# Patient Record
Sex: Female | Born: 1940
Health system: Southern US, Community
[De-identification: ages and names within clinical notes are randomized; demographics above are authoritative.]

## PROBLEM LIST (undated history)

## (undated) DIAGNOSIS — M1990 Unspecified osteoarthritis, unspecified site: Secondary | ICD-10-CM

## (undated) DIAGNOSIS — I10 Essential (primary) hypertension: Secondary | ICD-10-CM

## (undated) DIAGNOSIS — E785 Hyperlipidemia, unspecified: Secondary | ICD-10-CM

## (undated) DIAGNOSIS — T7840XA Allergy, unspecified, initial encounter: Secondary | ICD-10-CM

## (undated) DIAGNOSIS — E079 Disorder of thyroid, unspecified: Secondary | ICD-10-CM

## (undated) DIAGNOSIS — K5730 Diverticulosis of large intestine without perforation or abscess without bleeding: Secondary | ICD-10-CM

## (undated) DIAGNOSIS — R0600 Dyspnea, unspecified: Secondary | ICD-10-CM

## (undated) DIAGNOSIS — K635 Polyp of colon: Secondary | ICD-10-CM

## (undated) DIAGNOSIS — S72309A Unspecified fracture of shaft of unspecified femur, initial encounter for closed fracture: Secondary | ICD-10-CM

## (undated) DIAGNOSIS — M810 Age-related osteoporosis without current pathological fracture: Secondary | ICD-10-CM

## (undated) DIAGNOSIS — C801 Malignant (primary) neoplasm, unspecified: Secondary | ICD-10-CM

## (undated) DIAGNOSIS — D649 Anemia, unspecified: Secondary | ICD-10-CM

## (undated) DIAGNOSIS — G4730 Sleep apnea, unspecified: Secondary | ICD-10-CM

## (undated) DIAGNOSIS — H269 Unspecified cataract: Secondary | ICD-10-CM

## (undated) HISTORY — DX: Anemia, unspecified: D64.9

## (undated) HISTORY — PX: JOINT REPLACEMENT: SHX530

## (undated) HISTORY — DX: Disorder of thyroid, unspecified: E07.9

## (undated) HISTORY — DX: Polyp of colon: K63.5

## (undated) HISTORY — DX: Unspecified cataract: H26.9

## (undated) HISTORY — PX: BREAST CYST ASPIRATION: SHX578

## (undated) HISTORY — DX: Hyperlipidemia, unspecified: E78.5

## (undated) HISTORY — DX: Unspecified fracture of shaft of unspecified femur, initial encounter for closed fracture: S72.309A

## (undated) HISTORY — DX: Age-related osteoporosis without current pathological fracture: M81.0

## (undated) HISTORY — DX: Allergy, unspecified, initial encounter: T78.40XA

## (undated) HISTORY — DX: Essential (primary) hypertension: I10

## (undated) HISTORY — DX: Diverticulosis of large intestine without perforation or abscess without bleeding: K57.30

## (undated) HISTORY — PX: ABDOMINAL HYSTERECTOMY: SHX81

## (undated) HISTORY — PX: FRACTURE SURGERY: SHX138

## (undated) HISTORY — DX: Sleep apnea, unspecified: G47.30

## (undated) HISTORY — PX: EYE SURGERY: SHX253

---

## 2004-04-30 ENCOUNTER — Inpatient Hospital Stay: Payer: Self-pay | Admitting: General Practice

## 2004-08-28 ENCOUNTER — Ambulatory Visit: Payer: Self-pay

## 2004-11-03 ENCOUNTER — Ambulatory Visit: Payer: Self-pay | Admitting: Otolaryngology

## 2004-12-01 ENCOUNTER — Ambulatory Visit: Payer: Self-pay | Admitting: Otolaryngology

## 2004-12-10 ENCOUNTER — Encounter: Payer: Self-pay | Admitting: General Practice

## 2005-01-09 ENCOUNTER — Encounter: Payer: Self-pay | Admitting: General Practice

## 2005-07-24 ENCOUNTER — Encounter: Payer: Self-pay | Admitting: Orthopedic Surgery

## 2005-08-09 ENCOUNTER — Encounter: Payer: Self-pay | Admitting: Orthopedic Surgery

## 2006-12-30 ENCOUNTER — Ambulatory Visit: Payer: Self-pay | Admitting: Internal Medicine

## 2007-02-11 ENCOUNTER — Ambulatory Visit: Payer: Self-pay | Admitting: Internal Medicine

## 2007-05-17 ENCOUNTER — Ambulatory Visit: Payer: Self-pay | Admitting: Internal Medicine

## 2007-10-24 ENCOUNTER — Ambulatory Visit: Payer: Self-pay | Admitting: Internal Medicine

## 2008-01-03 ENCOUNTER — Ambulatory Visit: Payer: Self-pay | Admitting: Internal Medicine

## 2008-04-19 ENCOUNTER — Ambulatory Visit: Payer: Self-pay

## 2008-07-16 ENCOUNTER — Ambulatory Visit: Payer: Self-pay | Admitting: Internal Medicine

## 2008-09-06 ENCOUNTER — Encounter: Payer: Self-pay | Admitting: Internal Medicine

## 2008-09-10 ENCOUNTER — Other Ambulatory Visit: Payer: Self-pay | Admitting: Internal Medicine

## 2008-09-12 ENCOUNTER — Encounter: Payer: Self-pay | Admitting: Internal Medicine

## 2008-10-08 ENCOUNTER — Ambulatory Visit: Payer: Self-pay | Admitting: General Practice

## 2008-10-10 ENCOUNTER — Encounter: Payer: Self-pay | Admitting: Internal Medicine

## 2008-10-22 ENCOUNTER — Inpatient Hospital Stay: Payer: Self-pay | Admitting: General Practice

## 2008-11-12 ENCOUNTER — Encounter: Payer: Self-pay | Admitting: General Practice

## 2008-12-10 ENCOUNTER — Encounter: Payer: Self-pay | Admitting: General Practice

## 2009-05-30 ENCOUNTER — Ambulatory Visit: Payer: Self-pay | Admitting: Internal Medicine

## 2009-08-15 ENCOUNTER — Ambulatory Visit: Payer: Self-pay

## 2009-09-24 ENCOUNTER — Ambulatory Visit: Payer: Self-pay | Admitting: Internal Medicine

## 2010-02-09 DIAGNOSIS — K635 Polyp of colon: Secondary | ICD-10-CM

## 2010-02-09 HISTORY — DX: Polyp of colon: K63.5

## 2010-03-04 ENCOUNTER — Ambulatory Visit: Payer: Self-pay | Admitting: General Practice

## 2010-03-17 ENCOUNTER — Inpatient Hospital Stay: Payer: Self-pay | Admitting: General Practice

## 2010-04-07 ENCOUNTER — Encounter: Payer: Self-pay | Admitting: General Practice

## 2010-04-09 LAB — HM DIABETES EYE EXAM: HM Diabetic Eye Exam: NORMAL

## 2010-04-10 ENCOUNTER — Encounter: Payer: Self-pay | Admitting: General Practice

## 2010-08-05 ENCOUNTER — Encounter: Payer: Self-pay | Admitting: Internal Medicine

## 2010-08-10 ENCOUNTER — Encounter: Payer: Self-pay | Admitting: Internal Medicine

## 2010-08-10 DIAGNOSIS — K5730 Diverticulosis of large intestine without perforation or abscess without bleeding: Secondary | ICD-10-CM

## 2010-08-10 HISTORY — DX: Diverticulosis of large intestine without perforation or abscess without bleeding: K57.30

## 2010-09-21 LAB — HM COLONOSCOPY

## 2010-10-30 ENCOUNTER — Ambulatory Visit (INDEPENDENT_AMBULATORY_CARE_PROVIDER_SITE_OTHER): Payer: Medicare Other | Admitting: Internal Medicine

## 2010-10-30 ENCOUNTER — Encounter: Payer: Self-pay | Admitting: Internal Medicine

## 2010-10-30 DIAGNOSIS — Z79899 Other long term (current) drug therapy: Secondary | ICD-10-CM

## 2010-10-30 DIAGNOSIS — E785 Hyperlipidemia, unspecified: Secondary | ICD-10-CM

## 2010-10-30 DIAGNOSIS — K5730 Diverticulosis of large intestine without perforation or abscess without bleeding: Secondary | ICD-10-CM | POA: Insufficient documentation

## 2010-10-30 DIAGNOSIS — Z1239 Encounter for other screening for malignant neoplasm of breast: Secondary | ICD-10-CM

## 2010-10-30 DIAGNOSIS — I10 Essential (primary) hypertension: Secondary | ICD-10-CM

## 2010-10-30 DIAGNOSIS — R635 Abnormal weight gain: Secondary | ICD-10-CM

## 2010-10-30 DIAGNOSIS — E119 Type 2 diabetes mellitus without complications: Secondary | ICD-10-CM

## 2010-10-30 LAB — LIPID PANEL: HDL: 61.9 mg/dL (ref >39.00)

## 2010-10-30 LAB — HEMOGLOBIN A1C: Hgb A1c MFr Bld: 5.6 % (ref 4.6–6.5)

## 2010-10-30 LAB — COMPREHENSIVE METABOLIC PANEL
ALT: 26 U/L (ref 0–35)
Albumin: 4 g/dL (ref 3.5–5.2)
CO2: 26 mEq/L (ref 19–32)
Calcium: 9.1 mg/dL (ref 8.4–10.5)
Chloride: 106 mEq/L (ref 96–112)
Creatinine, Ser: 0.6 mg/dL (ref 0.4–1.2)
GFR: 102.4 mL/min (ref >60.00)

## 2010-10-30 NOTE — Patient Instructions (Signed)
Get back on your low carb diet!!!!!  Work your right quads while you are doing your PT  We'll call you with your lab results

## 2010-11-01 ENCOUNTER — Encounter: Payer: Self-pay | Admitting: Internal Medicine

## 2010-11-01 DIAGNOSIS — E1169 Type 2 diabetes mellitus with other specified complication: Secondary | ICD-10-CM | POA: Insufficient documentation

## 2010-11-01 DIAGNOSIS — I10 Essential (primary) hypertension: Secondary | ICD-10-CM | POA: Insufficient documentation

## 2010-11-01 DIAGNOSIS — E119 Type 2 diabetes mellitus without complications: Secondary | ICD-10-CM | POA: Insufficient documentation

## 2010-11-01 DIAGNOSIS — E785 Hyperlipidemia, unspecified: Secondary | ICD-10-CM | POA: Insufficient documentation

## 2010-11-01 DIAGNOSIS — Z794 Long term (current) use of insulin: Secondary | ICD-10-CM | POA: Insufficient documentation

## 2010-11-01 NOTE — Assessment & Plan Note (Signed)
Well controlled on lisinopril alone.

## 2010-11-01 NOTE — Progress Notes (Signed)
  Subjective:    Patient ID: Brenda Floyd, female    DOB: 10-08-1940, 70 y.o.   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 Has "fallen off the wagon" so to speak with regard to weight management by carbohydrate restriction and regular aerobic exercising with swimming.  Sugars remain well controlled using Lantus. No recent lows.  No sings of peripheral neuropathy or end organ damage.  Past Medical History  Diagnosis Date  . Colon polyps 2012    [facility], followup due 2015  . Diverticulosis of sigmoid colon July 2012    by colonoscopy  . Fracture closed, femur, shaft remote    s/p screws and rod repair  . Diabetes mellitus   . Hypertension   . Thyroid disease    No current outpatient prescriptions on file prior to visit.    Review of Systems  Constitutional: Positive for activity change and unexpected weight change. Negative for fever and chills.  HENT: Negative for hearing loss, ear pain, nosebleeds, congestion, sore throat, facial swelling, rhinorrhea, sneezing, mouth sores, trouble swallowing, neck pain, neck stiffness, voice change, postnasal drip, sinus pressure, tinnitus and ear discharge.   Eyes: Negative for pain, discharge, redness and visual disturbance.  Respiratory: Negative for cough, chest tightness, shortness of breath, wheezing and stridor.   Cardiovascular: Negative for chest pain, palpitations and leg swelling.  Musculoskeletal: Positive for arthralgias. Negative for myalgias.  Skin: Negative for color change and rash.  Neurological: Negative for dizziness, weakness, light-headedness and headaches.  Hematological: Negative for adenopathy.  All other systems reviewed and are negative.       Objective:   Physical Exam  Constitutional: She is oriented to person, place, and time. She appears well-developed and well-nourished.  HENT:    Mouth/Throat: Oropharynx is clear and moist.  Eyes: EOM are normal. Pupils are equal, round, and reactive to light. No scleral icterus.  Neck: Normal range of motion. Neck supple. No JVD present. No thyromegaly present.  Cardiovascular: Normal rate, regular rhythm, normal heart sounds and intact distal pulses.   Pulmonary/Chest: Effort normal and breath sounds normal.  Abdominal: Soft. Bowel sounds are normal. She exhibits no mass. There is no tenderness.  Musculoskeletal: Normal range of motion. She exhibits no edema.  Lymphadenopathy:    She has no cervical adenopathy.  Neurological: She is alert and oriented to person, place, and time.  Skin: Skin is warm and dry.  Psychiatric: She has a normal mood and affect.          Assessment & Plan:

## 2010-11-01 NOTE — Assessment & Plan Note (Addendum)
Well controlled by current hgba1c using current insuling regimen of Lantus and oral  metformin .  Contnue both,  Lisinopril.  Annual eye exams up to date.  No neuroapthy or nephropathy.

## 2010-11-05 ENCOUNTER — Other Ambulatory Visit: Payer: Self-pay | Admitting: Internal Medicine

## 2010-12-04 ENCOUNTER — Ambulatory Visit: Payer: Self-pay | Admitting: Internal Medicine

## 2010-12-04 LAB — HM MAMMOGRAPHY: HM Mammogram: NORMAL

## 2010-12-16 ENCOUNTER — Encounter: Payer: Self-pay | Admitting: Internal Medicine

## 2011-01-29 ENCOUNTER — Ambulatory Visit (INDEPENDENT_AMBULATORY_CARE_PROVIDER_SITE_OTHER): Payer: Medicare Other | Admitting: Internal Medicine

## 2011-01-29 ENCOUNTER — Encounter: Payer: Self-pay | Admitting: Internal Medicine

## 2011-01-29 DIAGNOSIS — I10 Essential (primary) hypertension: Secondary | ICD-10-CM

## 2011-01-29 DIAGNOSIS — E039 Hypothyroidism, unspecified: Secondary | ICD-10-CM

## 2011-01-29 DIAGNOSIS — E785 Hyperlipidemia, unspecified: Secondary | ICD-10-CM

## 2011-01-29 DIAGNOSIS — E119 Type 2 diabetes mellitus without complications: Secondary | ICD-10-CM

## 2011-01-29 DIAGNOSIS — IMO0002 Reserved for concepts with insufficient information to code with codable children: Secondary | ICD-10-CM

## 2011-01-29 LAB — COMPREHENSIVE METABOLIC PANEL
ALT: 25 U/L (ref 0–35)
Albumin: 4.1 g/dL (ref 3.5–5.2)
CO2: 26 mEq/L (ref 19–32)
Calcium: 9.1 mg/dL (ref 8.4–10.5)
Chloride: 108 mEq/L (ref 96–112)
Creatinine, Ser: 0.8 mg/dL (ref 0.4–1.2)
GFR: 78.21 mL/min (ref >60.00)
Potassium: 4.5 mEq/L (ref 3.5–5.1)
Sodium: 142 mEq/L (ref 135–145)
Total Protein: 7 g/dL (ref 6.0–8.3)

## 2011-01-29 LAB — HEMOGLOBIN A1C: Hgb A1c MFr Bld: 5.7 % (ref 4.6–6.5)

## 2011-01-29 MED ORDER — INSULIN GLARGINE 100 UNIT/ML ~~LOC~~ SOLN: 55.0000 [IU] | Freq: Every day | SUBCUTANEOUS | Status: DC

## 2011-01-29 MED ORDER — METFORMIN HCL ER 750 MG PO TB24: 750.0000 mg | ORAL_TABLET | Freq: Three times a day (TID) | ORAL | Status: DC

## 2011-01-29 NOTE — Patient Instructions (Addendum)
I want you to lose 12 lbs by your next visit in 3 months  I want you try one water aerobics class with Sarge at the [facility] in  downtown [place]

## 2011-01-29 NOTE — Progress Notes (Signed)
  Subjective:    Patient ID: Brenda Floyd, female    DOB: [birth year], 70 y.o.   MRN: [id number]  HPI    Review of Systems     Objective:   Physical Exam        Assessment & Plan:  Diabetes Hyperlipidemia Hypothyroidism Subjective:     Brenda Floyd is a 70 y.o. female who presents for follow up of diabetes.. Current symptoms include: none. Patient denies foot ulcerations, hypoglycemia , nausea, paresthesia of the feet, visual disturbances and vomiting. Evaluation to date has been: fasting blood sugar, fasting lipid panel, hemoglobin A1C and microalbuminuria. Home sugars: BGs consistently in an acceptable range, symptomatic hypoglycemia does not occur. Current treatments: no recent interventions. Last dilated eye exam August 2012  The following portions of the patient's history were reviewed and updated as appropriate: allergies, current medications, past family history, past medical history, past social history, past surgical history and problem list.  Review of Systems A comprehensive review of systems was negative.    Objective:    BP 128/76  Pulse 73  Temp(Src) 98.1 F (36.7 C) (Oral)  Ht 5' 4.5" (1.638 m)  Wt 262 lb (118.842 kg)  BMI 44.28 kg/m2 General appearance: alert, cooperative and appears stated age Ears: normal TM's and external ear canals both ears Throat: lips, mucosa, and tongue normal; teeth and gums normal Neck: no adenopathy, no carotid bruit, no JVD, supple, symmetrical, trachea midline and thyroid not enlarged, symmetric, no tenderness/mass/nodules Lungs: clear to auscultation bilaterally Heart: regular rate and rhythm, S1, S2 normal, no murmur, click, rub or gallop Extremities: extremities normal, atraumatic, no cyanosis or edema Pulses: 2+ and symmetric Neurologic: Alert and oriented X 3, normal strength and tone. Normal symmetric reflexes. Normal coordination and gait    @DMFOOTEXAM @  Patient was evaluated for proper footwear and  sizing.  Laboratory: No components found with this basename: A1C      Assessment:    Diabetes mellitus Type II, under excellent control.    Plan:    Discussed general issues about diabetes pathophysiology and management. Counseling at today's visit: set a weight loss goal of 12 lbs over the next 3 months. Encouraged aerobic exercise. Reminded to get yearly retinal exam. Follow up in 3 months or as needed.

## 2011-02-01 DIAGNOSIS — M533 Sacrococcygeal disorders, not elsewhere classified: Secondary | ICD-10-CM | POA: Insufficient documentation

## 2011-02-01 NOTE — Assessment & Plan Note (Addendum)
Uncontrolled  .  LDL was 185 and triglycerides were 236 in September, with no significant change.  Will recommend  Trial of crestor since her triglycerides should improve with return to low carb diet and exercise.

## 2011-02-01 NOTE — Assessment & Plan Note (Signed)
Well controlled, no changes today 

## 2011-02-04 ENCOUNTER — Other Ambulatory Visit: Payer: Self-pay | Admitting: *Deleted

## 2011-02-04 MED ORDER — ROSUVASTATIN CALCIUM 10 MG PO TABS: 10.0000 mg | ORAL_TABLET | Freq: Every day | ORAL | Status: DC

## 2011-03-03 ENCOUNTER — Other Ambulatory Visit: Payer: Self-pay | Admitting: *Deleted

## 2011-03-03 NOTE — Telephone Encounter (Signed)
Faxed request from [facility].

## 2011-03-04 ENCOUNTER — Encounter: Payer: Self-pay | Admitting: Internal Medicine

## 2011-03-04 MED ORDER — ROSUVASTATIN CALCIUM 10 MG PO TABS: 10.0000 mg | ORAL_TABLET | Freq: Every day | ORAL | Status: DC

## 2011-03-09 ENCOUNTER — Telehealth: Payer: Self-pay | Admitting: *Deleted

## 2011-03-09 MED ORDER — ROSUVASTATIN CALCIUM 10 MG PO TABS: 10.0000 mg | ORAL_TABLET | Freq: Every day | ORAL | Status: DC

## 2011-03-09 NOTE — Telephone Encounter (Signed)
Opened in error

## 2011-03-09 NOTE — Telephone Encounter (Signed)
I have a recieved an email from [facility] that they have not had a response to their request for order re Crestor ( 3 months supply). The second months supply from [facility] cost $ 141.00.  Your help with this is greatly appreciated.  Thanks you.  Brenda Floyd      Sent refill to [facility]. Left message notifying patient.

## 2011-04-29 ENCOUNTER — Ambulatory Visit (INDEPENDENT_AMBULATORY_CARE_PROVIDER_SITE_OTHER): Payer: Medicare Other | Admitting: Internal Medicine

## 2011-04-29 ENCOUNTER — Encounter: Payer: Self-pay | Admitting: Internal Medicine

## 2011-04-29 VITALS — BP 124/86 | HR 97 | Temp 97.5°F | Resp 16 | Ht 65.0 in | Wt 269.8 lb

## 2011-04-29 DIAGNOSIS — E669 Obesity, unspecified: Secondary | ICD-10-CM | POA: Diagnosis not present

## 2011-04-29 DIAGNOSIS — Z79899 Other long term (current) drug therapy: Secondary | ICD-10-CM | POA: Diagnosis not present

## 2011-04-29 DIAGNOSIS — E785 Hyperlipidemia, unspecified: Secondary | ICD-10-CM

## 2011-04-29 DIAGNOSIS — M791 Myalgia, unspecified site: Secondary | ICD-10-CM

## 2011-04-29 DIAGNOSIS — E119 Type 2 diabetes mellitus without complications: Secondary | ICD-10-CM | POA: Diagnosis not present

## 2011-04-29 LAB — LIPID PANEL
HDL: 69.5 mg/dL (ref >39.00)
Triglycerides: 178 mg/dL — ABNORMAL HIGH (ref 0.0–149.0)
VLDL: 35.6 mg/dL (ref 0.0–40.0)

## 2011-04-29 LAB — TSH: TSH: 0.96 u[IU]/mL (ref 0.35–5.50)

## 2011-04-29 NOTE — Patient Instructions (Addendum)
Consider the Low Glycemic Index Diet and 6 smaller meals daily :   7 AM Low carbohydrate Protein  Shakes (EAS' Carb Control"    Or Atkins ,  Available everywhere,   In  cases at [employer] )  2.5 carbs  (Add or substitute a toasted sandwhich thin w/ peanut butter)  10 AM: Protein bar by Atkins (snack size,  Chocolate lover's variety at  [employer])    Lunch: sandwich on pita bread or flatbread (Joseph's makes a pita bread and a flat bread , available at [employer] and [employer]; Toufayah makes a low carb flatbread available at [employer] and [employer])   3 PM:  Mid day :  Another protein bar,  Or a  cheese stick, 1/4 cup of almonds, walnuts, pistachios, pecans, peanuts,  Macadamia nuts  6 PM  Dinner:  "mean and green:"  Meat/chicken/fish, salad, and green veggie : use ranch, vinagrette,  Blue cheese, etc  9 PM snack : Breyer's low carb fudgsicle or  ice cream bar (Carb Smart) Weight Watcher's ice cream bar , or another protein shake  ------------------------------------------------------------------------------------------------------------------------------------------------------------------------  YOU ARE OVERDUE FOR YOUR EYE EXAM .  PLEASE GET THIS DONE

## 2011-04-29 NOTE — Progress Notes (Signed)
Patient ID: Brenda Floyd, female   DOB: [birth year], 71 y.o.   MRN: [id number] Patient Active Problem List  Diagnoses  . Diverticulosis of sigmoid colon  . Diabetes mellitus  . Hypertension  . Obesity (BMI 30-39.9)  . Hyperlipidemia LDL goal < 70  . Degenerative disk disease    Subjective:  CC:   Chief Complaint  Patient presents with  . Follow-up    3 month    HPI:   Brenda Floyd is a 71 y.o. female who presents  for 3 month follow up on diabetes, obesity, hyperlipidemia.  She has no complaints. She is taking a water aerobics class twice per week at [facility] in [place].  The clots focuses on strengthening core muscles. She's also start a walking program at home with [place] farm. She has not lost any weight yet but,  her blood sugars are under control.   she is tolerating the Crestor.    she has not had a diabetic eye exam since 2011.  she denies numbness and tingling of the extremities. She has no history of night sweats tremors, just pain or shortness of breath.     Past Medical History  Diagnosis Date  . Colon polyps 2012    [facility], followup due 2015  . Diverticulosis of sigmoid colon July 2012    by colonoscopy  . Fracture closed, femur, shaft remote    s/p screws and rod repair  . Diabetes mellitus   . Hypertension   . Thyroid disease     Past Surgical History  Procedure Date  . Joint replacement     bilateral knee, 2011         The following portions of the patient's history were reviewed and updated as appropriate: Allergies, current medications, and problem list.    Review of Systems:   12 Pt  review of systems was negative except those addressed in the HPI,     History   Social History  . Marital Status: Single    Spouse Name: N/A    Number of Children: N/A  . Years of Education: N/A   Occupational History  . Not on file.   Social History Main Topics  . Smoking status: Former Smoker    Quit date: 10/30/1990  .  Smokeless tobacco: Never Used  . Alcohol Use: No  . Drug Use: No  . Sexually Active: Not on file   Other Topics Concern  . Not on file   Social History Narrative  . No narrative on file    Objective:  BP 124/86  Pulse 97  Temp(Src) 97.5 F (36.4 C) (Oral)  Resp 16  Ht 5\' 5"  (1.651 m)  Wt 269 lb 12 oz (122.358 kg)  BMI 44.89 kg/m2  SpO2 94%  General appearance: alert, cooperative and appears stated age Ears: normal TM's and external ear canals both ears Throat: lips, mucosa, and tongue normal; teeth and gums normal Neck: no adenopathy, no carotid bruit, supple, symmetrical, trachea midline and thyroid not enlarged, symmetric, no tenderness/mass/nodules Back: symmetric, no curvature. ROM normal. No CVA tenderness. Lungs: clear to auscultation bilaterally Heart: regular rate and rhythm, S1, S2 normal, no murmur, click, rub or gallop Abdomen: soft, non-tender; bowel sounds normal; no masses,  no organomegaly Pulses: 2+ and symmetric Skin: Skin color, texture, turgor normal. No rashes or lesions Lymph nodes: Cervical, supraclavicular, and axillary nodes normal.  Assessment and Plan:  Obesity (BMI 30-39.9) Has gained 7 lbs despite starting a water aerobics  class because she has not resume the low glycemic diet. Counseling given  Hyperlipidemia LDL goal < 70 Tolerating crestor but having a lot af arthralgias, and initially myalgias . Repeat lipids they are greatly improved and at. Metabolic panel is still pending   Diabetes mellitus Well-controlled with hemoglobin A1c less than 6. No changes to her regimen today . She was admonished to arrange diabetic eye exam at [facility] eye.    Updated Medication List Outpatient Encounter Prescriptions as of 04/29/2011  Medication Sig Dispense Refill  . acetaminophen (TYLENOL) 325 MG tablet Take 650 mg by mouth as needed.        . Cholecalciferol (VITAMIN D3) 5000 UNITS CAPS Take 1 tablet by mouth daily.        . Coenzyme Q10 (COQ10)  100 MG CAPS Take 1 tablet by mouth daily.        . diphenhydrAMINE (BENADRYL) 25 mg capsule Take 25 mg by mouth as needed.        . fexofenadine (ALLEGRA) 180 MG tablet Take 180 mg by mouth daily.        . insulin glargine (LANTUS) 100 UNIT/ML injection Inject 55 Units into the skin daily.  30 mL  1  . levothyroxine (SYNTHROID, LEVOTHROID) 75 MCG tablet Take 75 mcg by mouth daily.        Marland Kitchen lisinopril (PRINIVIL,ZESTRIL) 20 MG tablet Take 20 mg by mouth daily.        . meloxicam (MOBIC) 15 MG tablet Take 15 mg by mouth daily.        . metFORMIN (GLUCOPHAGE-XR) 750 MG 24 hr tablet Take 1 tablet (750 mg total) by mouth 3 (three) times daily.  180 tablet  1  . PARoxetine (PAXIL) 20 MG tablet TAKE 1 TABLET DAILY  90 tablet  2  . rosuvastatin (CRESTOR) 10 MG tablet Take 1 tablet (10 mg total) by mouth daily.  90 tablet  3  . traMADol (ULTRAM) 50 MG tablet Take 50 mg by mouth as needed.           Orders Placed This Encounter  Procedures  . HM MAMMOGRAPHY  . TSH  . Lipid panel  . COMPLETE METABOLIC PANEL WITH GFR  . Hemoglobin A1c  . CK    No Follow-up on file.

## 2011-04-29 NOTE — Assessment & Plan Note (Addendum)
Has gained 7 lbs despite starting a water aerobics class because she has not resume the low glycemic diet. Counseling given

## 2011-04-29 NOTE — Assessment & Plan Note (Addendum)
Tolerating crestor but having a lot af arthralgias, and initially myalgias . Repeat lipids they are greatly improved and at. Metabolic panel is still pending

## 2011-04-29 NOTE — Assessment & Plan Note (Signed)
Well-controlled with hemoglobin A1c less than 6. No changes to her regimen today . She was admonished to arrange diabetic eye exam at [facility] eye.

## 2011-04-30 LAB — COMPLETE METABOLIC PANEL WITH GFR
Alkaline Phosphatase: 55 U/L (ref 39–117)
CO2: 23 mEq/L (ref 19–32)
Creat: 0.85 mg/dL (ref 0.50–1.10)
GFR, Est African American: 79 mL/min
GFR, Est Non African American: 69 mL/min
Glucose, Bld: 88 mg/dL (ref 70–99)
Total Bilirubin: 0.6 mg/dL (ref 0.3–1.2)

## 2011-05-02 ENCOUNTER — Encounter: Payer: Self-pay | Admitting: Internal Medicine

## 2011-05-04 ENCOUNTER — Telehealth: Payer: Self-pay | Admitting: *Deleted

## 2011-05-04 NOTE — Telephone Encounter (Signed)
Website re [email or web address]   Above is message from the patient to you.

## 2011-06-09 ENCOUNTER — Other Ambulatory Visit: Payer: Self-pay | Admitting: Internal Medicine

## 2011-06-20 ENCOUNTER — Other Ambulatory Visit: Payer: Self-pay | Admitting: Internal Medicine

## 2011-07-17 ENCOUNTER — Other Ambulatory Visit: Payer: Self-pay | Admitting: Internal Medicine

## 2011-07-24 ENCOUNTER — Other Ambulatory Visit: Payer: Self-pay | Admitting: Internal Medicine

## 2011-07-24 ENCOUNTER — Encounter: Payer: Self-pay | Admitting: Internal Medicine

## 2011-07-30 ENCOUNTER — Ambulatory Visit: Payer: Medicare Other | Admitting: Internal Medicine

## 2011-08-06 ENCOUNTER — Ambulatory Visit (INDEPENDENT_AMBULATORY_CARE_PROVIDER_SITE_OTHER): Payer: Medicare Other | Admitting: Internal Medicine

## 2011-08-06 ENCOUNTER — Encounter: Payer: Self-pay | Admitting: Internal Medicine

## 2011-08-06 VITALS — BP 112/76 | HR 88 | Temp 98.0°F | Resp 16 | Wt 256.5 lb

## 2011-08-06 DIAGNOSIS — E119 Type 2 diabetes mellitus without complications: Secondary | ICD-10-CM

## 2011-08-06 DIAGNOSIS — I10 Essential (primary) hypertension: Secondary | ICD-10-CM

## 2011-08-06 DIAGNOSIS — Z1239 Encounter for other screening for malignant neoplasm of breast: Secondary | ICD-10-CM

## 2011-08-06 DIAGNOSIS — Z79899 Other long term (current) drug therapy: Secondary | ICD-10-CM | POA: Diagnosis not present

## 2011-08-06 DIAGNOSIS — E785 Hyperlipidemia, unspecified: Secondary | ICD-10-CM

## 2011-08-06 DIAGNOSIS — E669 Obesity, unspecified: Secondary | ICD-10-CM

## 2011-08-06 DIAGNOSIS — E1169 Type 2 diabetes mellitus with other specified complication: Secondary | ICD-10-CM

## 2011-08-06 NOTE — Progress Notes (Signed)
Patient ID: Brenda Floyd, female   DOB: 1940/10/22, 71 y.o.   MRN: [id number]  Patient Active Problem List  Diagnosis  . Diverticulosis of sigmoid colon  . Diabetes mellitus type 2 in obese  . Hypertension  . Obesity (BMI 30-39.9)  . Hyperlipidemia LDL goal < 70  . Degenerative disk disease    Subjective:  CC:   Chief Complaint  Patient presents with  . Follow-up    3 month    HPI:   Brenda Floyd a 71 y.o. female who presents  For 3 month follow up on diabetes, obesity, hypertension and hyperlipidemia.  She has been following Dr  Melvyn Neth Low carb diet and has lost 13 lbs this month already.  Since starting the diet she has had to reduce to dose of  Lantus from 55 to 25 units daily due to recurrent hypoglycemic events.   Sugars are now consistently  Low 90's to 110s without much variation.  She denies chest pain, dyspnea, neuropathy, edema,  constipation and diarrhea. She is taking a water aerobics class twice per week at [facility] in [place].  she is tolerating the Crestor.    she has not had a diabetic eye exam since 2011.  she denies numbness and tingling of the extremities. She has no history of night sweats tremors, just pain or shortness of breath.    Past Medical History  Diagnosis Date  . Colon polyps 2012    [facility], followup due 2015  . Diverticulosis of sigmoid colon July 2012    by colonoscopy  . Fracture closed, femur, shaft remote    s/p screws and rod repair  . Diabetes mellitus   . Hypertension   . Thyroid disease     Past Surgical History  Procedure Date  . Joint replacement     bilateral knee, 2011     The following portions of the patient's history were reviewed and updated as appropriate: Allergies, current medications, and problem list.    Review of Systems:   Comprehensive  review of systems was negative.   History   Social History  . Marital Status: Single    Spouse Name: N/A    Number of Children: N/A  . Years of  Education: N/A   Occupational History  . Not on file.   Social History Main Topics  . Smoking status: Former Smoker    Quit date: 10/30/1990  . Smokeless tobacco: Never Used  . Alcohol Use: No  . Drug Use: No  . Sexually Active: Not on file   Other Topics Concern  . Not on file   Social History Narrative  . No narrative on file    Objective:  BP 112/76  Pulse 88  Temp 98 F (36.7 C) (Oral)  Resp 16  Wt 256 lb 8 oz (116.348 kg)  SpO2 95%  General appearance: alert, cooperative and appears stated age Neck: no adenopathy, no carotid bruit, supple, symmetrical, trachea midline and thyroid not enlarged, symmetric, no tenderness/mass/nodules Back: symmetric, no curvature. ROM normal. No CVA tenderness. Lungs: clear to auscultation bilaterally Heart: regular rate and rhythm, S1, S2 normal, no murmur, click, rub or gallop Abdomen: soft, non-tender; bowel sounds normal; no masses,  no organomegaly Pulses: 2+ and symmetric Skin: Skin color, texture, turgor normal. No rashes or lesions Lymph nodes: Cervical, supraclavicular, and axillary nodes normal. Foot exam:  Nails are well trimmed,  No callouses,  Sensation intact to microfilament  Assessment and Plan:  Obesity (BMI 30-39.9) She has been  able to lose weight in the past with the low GI diet but has not been adherent for over 6 months until recently.  She has lost weight in the last 2 weeks since resuming diet .   Diabetes mellitus type 2 in obese Excellent control,  hgba1c 5.7.  Recent reduction in lantus dose with initiation of low GI diet. Continue ACE INihibitor and statin for goal LDL 70  Hypertension Well controlled on current regimen. Renal function stable, no changes today.   Updated Medication List Outpatient Encounter Prescriptions as of 08/06/2011  Medication Sig Dispense Refill  . acetaminophen (TYLENOL) 325 MG tablet Take 650 mg by mouth as needed.        . cetirizine (ZYRTEC) 10 MG tablet Take 10 mg by  mouth daily.      . Cholecalciferol (VITAMIN D3) 5000 UNITS CAPS Take 1 tablet by mouth daily.        . Coenzyme Q10 (COQ10) 100 MG CAPS Take 1 tablet by mouth daily.        . diphenhydrAMINE (BENADRYL) 25 mg capsule Take 25 mg by mouth as needed.        . insulin glargine (LANTUS SOLOSTAR) 100 UNIT/ML injection       . levothyroxine (SYNTHROID, LEVOTHROID) 75 MCG tablet Take 75 mcg by mouth daily.        Marland Kitchen lisinopril (PRINIVIL,ZESTRIL) 20 MG tablet TAKE ONE TABLET BY MOUTH EVERY DAY  90 tablet  3  . meloxicam (MOBIC) 15 MG tablet TAKE ONE TABLET BY MOUTH EVERY DAY  90 tablet  3  . metFORMIN (GLUCOPHAGE-XR) 750 MG 24 hr tablet TAKE 1 TABLET THREE TIMES A DAY  180 tablet  3  . PARoxetine (PAXIL) 20 MG tablet TAKE 1 TABLET DAILY  90 tablet  2  . rosuvastatin (CRESTOR) 10 MG tablet Take 1 tablet (10 mg total) by mouth daily.  90 tablet  3  . traMADol (ULTRAM) 50 MG tablet TAKE 1 TABLET FOUR TIMES A DAY AS NEEDED FOR KNEE PAIN  360 tablet  2  . DISCONTD: LANTUS SOLOSTAR 100 UNIT/ML injection INJECT 55 UNITS UNDER THE SKIN DAILY  15 Syringe  3  . ampicillin (PRINCIPEN) 500 MG capsule Take 4 capsules one hour prior to dental procedure  4 capsule  3  . DISCONTD: fexofenadine (ALLEGRA) 180 MG tablet Take 180 mg by mouth daily.           Orders Placed This Encounter  Procedures  . MM Digital Screening  . HM MAMMOGRAPHY  . Lipid panel  . COMPLETE METABOLIC PANEL WITH GFR  . Hemoglobin A1c    No Follow-up on file.

## 2011-08-07 ENCOUNTER — Telehealth: Payer: Self-pay | Admitting: *Deleted

## 2011-08-07 MED ORDER — AMOXICILLIN 500 MG PO TABS: ORAL_TABLET | ORAL | Status: DC

## 2011-08-07 MED ORDER — AMPICILLIN 500 MG PO CAPS: ORAL_CAPSULE | ORAL | Status: DC

## 2011-08-07 NOTE — Telephone Encounter (Signed)
Received fax from [facility] pharmacy stating that patient has always received Amoxicillin and not Ampicillin for dental procedures.  Please advise.

## 2011-08-07 NOTE — Telephone Encounter (Signed)
Patient told me ampicillin,  Which I thought was odd, so ok to change to amoxicillin 500 mg #4

## 2011-08-07 NOTE — Telephone Encounter (Signed)
Rx for Amoxicillin sent to [facility] electronically.  Called pharmacy and left a message advising that Rx was sent.

## 2011-08-08 ENCOUNTER — Encounter: Payer: Self-pay | Admitting: Internal Medicine

## 2011-08-08 NOTE — Assessment & Plan Note (Signed)
She has been able to lose weight in the past with the low GI diet but has not been adherent for over 6 months until recently.  She has lost weight in the last 2 weeks since resuming diet .

## 2011-08-08 NOTE — Assessment & Plan Note (Signed)
Excellent control,  hgba1c 5.7.  Recent reduction in lantus dose with initiation of low GI diet. Continue ACE INihibitor and statin for goal LDL 70

## 2011-08-08 NOTE — Assessment & Plan Note (Signed)
Well controlled on current regimen. Renal function stable, no changes today. 

## 2011-08-10 ENCOUNTER — Other Ambulatory Visit: Payer: Self-pay | Admitting: Internal Medicine

## 2011-09-10 ENCOUNTER — Other Ambulatory Visit: Payer: Self-pay | Admitting: Internal Medicine

## 2011-09-10 NOTE — Telephone Encounter (Signed)
Is patient to continue taking this?

## 2011-09-11 NOTE — Telephone Encounter (Signed)
Yes, she has a history of knee replacement and needs to have prophylaxis prior to dental procedures.  Ok to refill,  i willsend

## 2011-11-09 ENCOUNTER — Encounter: Payer: Self-pay | Admitting: Internal Medicine

## 2011-11-09 ENCOUNTER — Ambulatory Visit (INDEPENDENT_AMBULATORY_CARE_PROVIDER_SITE_OTHER): Payer: Medicare Other | Admitting: Internal Medicine

## 2011-11-09 VITALS — BP 130/78 | HR 78 | Temp 97.8°F | Wt 278.0 lb

## 2011-11-09 DIAGNOSIS — I10 Essential (primary) hypertension: Secondary | ICD-10-CM

## 2011-11-09 DIAGNOSIS — Z23 Encounter for immunization: Secondary | ICD-10-CM

## 2011-11-09 DIAGNOSIS — E669 Obesity, unspecified: Secondary | ICD-10-CM | POA: Diagnosis not present

## 2011-11-09 DIAGNOSIS — R7989 Other specified abnormal findings of blood chemistry: Secondary | ICD-10-CM

## 2011-11-09 DIAGNOSIS — E785 Hyperlipidemia, unspecified: Secondary | ICD-10-CM | POA: Diagnosis not present

## 2011-11-09 DIAGNOSIS — Z1239 Encounter for other screening for malignant neoplasm of breast: Secondary | ICD-10-CM | POA: Diagnosis not present

## 2011-11-09 DIAGNOSIS — E119 Type 2 diabetes mellitus without complications: Secondary | ICD-10-CM

## 2011-11-09 DIAGNOSIS — E1169 Type 2 diabetes mellitus with other specified complication: Secondary | ICD-10-CM

## 2011-11-09 DIAGNOSIS — Z79899 Other long term (current) drug therapy: Secondary | ICD-10-CM | POA: Diagnosis not present

## 2011-11-09 LAB — LIPID PANEL
HDL: 57.5 mg/dL (ref >39.00)
Triglycerides: 197 mg/dL — ABNORMAL HIGH (ref 0.0–149.0)
VLDL: 39.4 mg/dL (ref 0.0–40.0)

## 2011-11-09 LAB — HEMOGLOBIN A1C: Hgb A1c MFr Bld: 6 % (ref 4.6–6.5)

## 2011-11-09 NOTE — Assessment & Plan Note (Signed)
She has had a 20 pound weight gain over the summer after losing steadily for years. This is due to dietary nonadherence and lack of regular formal exercise. Encouragement given. She is a retired Engineer, civil (consulting) and knows full well the ramifications of obesity, as well as how to resume a low glycemic index diet.

## 2011-11-09 NOTE — Progress Notes (Signed)
Patient ID: Brenda Floyd, female   DOB: 12-13-1940, 71 y.o.   MRN: [id number]  Patient Active Problem List  Diagnosis  . Diverticulosis of sigmoid colon  . Diabetes mellitus type 2 in obese  . Hypertension  . Obesity (BMI 30-39.9)  . Hyperlipidemia LDL goal < 70  . Degenerative disk disease    Subjective:  CC:   Chief Complaint  Patient presents with  . Follow-up    HPI:   Brenda Floyd a 71 y.o. female who presents 3 month follow up on diabetes mellitus, hypertension, hyperlipidemia, and obesity. She has gained over 20 pounds since her last visit. She attributes the weight gain to dietary noncompliance. She was previously following a very low glycemic index diet but for the summer has been indulging a bit. Her blood sugars have been running in the 90s to 100s using  30 units of insulin daily. She's had no lows. dm.  During the summer she had a two-week history of watery diarrhea was not accompanied by cramping or fevers. It resolves spontaneously. It did aggravate her fecal incontinence which occurs regularly about once a week of formed stool. She did not seek medical attention for the diarrhea.  She attributed it to diverticulitis but has no history of diverticulitis. She does have exposure to form and was as she has geese,  chicken goats and dogs at her home.   Past Medical History  Diagnosis Date  . Colon polyps 2012    [facility], followup due 2015  . Diverticulosis of sigmoid colon July 2012    by colonoscopy  . Fracture closed, femur, shaft remote    s/p screws and rod repair  . Diabetes mellitus   . Hypertension   . Thyroid disease     Past Surgical History  Procedure Date  . Joint replacement     bilateral knee, 2011         The following portions of the patient's history were reviewed and updated as appropriate: Allergies, current medications, and problem list.    Review of Systems:   12 Pt  review of systems was negative except those addressed in  the HPI,     History   Social History  . Marital Status: Single    Spouse Name: N/A    Number of Children: N/A  . Years of Education: N/A   Occupational History  . Not on file.   Social History Main Topics  . Smoking status: Former Smoker    Quit date: 10/30/1990  . Smokeless tobacco: Never Used  . Alcohol Use: No  . Drug Use: No  . Sexually Active: Not on file   Other Topics Concern  . Not on file   Social History Narrative  . No narrative on file    Objective:  BP 130/78  Pulse 78  Temp 97.8 F (36.6 C) (Oral)  Wt 278 lb (126.1 kg)  SpO2 96%  General appearance: alert, cooperative and appears stated age Ears: normal TM's and external ear canals both ears Throat: lips, mucosa, and tongue normal; teeth and gums normal Neck: no adenopathy, no carotid bruit, supple, symmetrical, trachea midline and thyroid not enlarged, symmetric, no tenderness/mass/nodules Back: symmetric, no curvature. ROM normal. No CVA tenderness. Lungs: clear to auscultation bilaterally Heart: regular rate and rhythm, S1, S2 normal, no murmur, click, rub or gallop Abdomen: soft, non-tender; bowel sounds normal; no masses,  no organomegaly Pulses: 2+ and symmetric Skin: Skin color, texture, turgor normal. No rashes or lesions Lymph nodes: Cervical,  supraclavicular, and axillary nodes normal.  Assessment and Plan:  Diabetes mellitus type 2 in obese Historically well managed with low glycemic index diet, Lantus, and metformin. Repeat A1c is due now. She is up-to-date with annual eye exams. Foot exam today was normal with respect to sensation and condition. Her cholesterol has been well controlled with Crestor and fish oil with mild elevation of triglycerides. She is wondering if she could take a break from the Crestor. We decided to wait until we see what her cholesterol is today, and we'll consider substituting red yeast rice.  Hypertension Well controlled on current regimen. Renal function  stable, no changes today.  Obesity (BMI 30-39.9) She has had a 20 pound weight gain over the summer after losing steadily for years. This is due to dietary nonadherence and lack of regular formal exercise. Encouragement given. She is a retired Engineer, civil (consulting) and knows full well the ramifications of obesity, as well as how to resume a low glycemic index diet.   Updated Medication List Outpatient Encounter Prescriptions as of 11/09/2011  Medication Sig Dispense Refill  . acetaminophen (TYLENOL) 325 MG tablet Take 650 mg by mouth as needed.        . Cholecalciferol (VITAMIN D3) 5000 UNITS CAPS Take 1 tablet by mouth daily.        . Coenzyme Q10 (COQ10) 100 MG CAPS Take 1 tablet by mouth daily.        . diphenhydrAMINE (BENADRYL) 25 mg capsule Take 25 mg by mouth as needed.        . fexofenadine (ALLEGRA) 180 MG tablet Take 180 mg by mouth daily.      . insulin glargine (LANTUS SOLOSTAR) 100 UNIT/ML injection       . levothyroxine (SYNTHROID, LEVOTHROID) 75 MCG tablet TAKE 1 TABLET DAILY FOR HYPOTHYROIDISM  90 tablet  3  . lisinopril (PRINIVIL,ZESTRIL) 20 MG tablet TAKE ONE TABLET BY MOUTH EVERY DAY  90 tablet  3  . meloxicam (MOBIC) 15 MG tablet TAKE ONE TABLET BY MOUTH EVERY DAY  90 tablet  3  . metFORMIN (GLUCOPHAGE-XR) 750 MG 24 hr tablet TAKE 1 TABLET THREE TIMES A DAY  180 tablet  3  . PARoxetine (PAXIL) 20 MG tablet TAKE 1 TABLET DAILY  90 tablet  2  . rosuvastatin (CRESTOR) 10 MG tablet Take 1 tablet (10 mg total) by mouth daily.  90 tablet  3  . traMADol (ULTRAM) 50 MG tablet TAKE 1 TABLET FOUR TIMES A DAY AS NEEDED FOR KNEE PAIN  360 tablet  2  . amoxicillin (AMOXIL) 500 MG capsule TAKE 4 CAPSULES BY MOUTH 1 HOUR PRIOR TO DENTAL PROCEDURE  4 capsule  1  . ampicillin (PRINCIPEN) 500 MG capsule Take 4 capsules one hour prior to dental procedure  4 capsule  3  . DISCONTD: cetirizine (ZYRTEC) 10 MG tablet Take 10 mg by mouth daily.         Orders Placed This Encounter  Procedures  . MM Digital  Screening  . Tetanus vaccine IM    No Follow-up on file.

## 2011-11-09 NOTE — Assessment & Plan Note (Addendum)
Historically well managed with low glycemic index diet, Lantus, and metformin. Repeat A1c is due now. She is up-to-date with annual eye exams. Foot exam today was normal with respect to sensation and condition. Her cholesterol has been well controlled with Crestor and fish oil with mild elevation of triglycerides. She is wondering if she could take a break from the Crestor. We decided to wait until we see what her cholesterol is today, and we'll consider substituting red yeast rice.

## 2011-11-09 NOTE — Assessment & Plan Note (Signed)
Well controlled on current regimen. Renal function stable, no changes today. 

## 2011-11-10 LAB — COMPLETE METABOLIC PANEL WITH GFR
Alkaline Phosphatase: 57 U/L (ref 39–117)
BUN: 17 mg/dL (ref 6–23)
CO2: 25 mEq/L (ref 19–32)
Creat: 0.66 mg/dL (ref 0.50–1.10)
GFR, Est African American: >89 mL/min
GFR, Est Non African American: 88 mL/min
Glucose, Bld: 101 mg/dL — ABNORMAL HIGH (ref 70–99)
Sodium: 138 mEq/L (ref 135–145)
Total Bilirubin: 0.5 mg/dL (ref 0.3–1.2)

## 2011-11-10 NOTE — Addendum Note (Signed)
Addended by: Duncan Dull on: 11/10/2011 01:38 PM   Modules accepted: Orders

## 2011-11-11 ENCOUNTER — Encounter: Payer: Self-pay | Admitting: Internal Medicine

## 2011-11-16 ENCOUNTER — Encounter: Payer: Self-pay | Admitting: Internal Medicine

## 2011-12-08 ENCOUNTER — Ambulatory Visit: Payer: Self-pay | Admitting: Internal Medicine

## 2011-12-08 DIAGNOSIS — Z1231 Encounter for screening mammogram for malignant neoplasm of breast: Secondary | ICD-10-CM | POA: Diagnosis not present

## 2011-12-09 LAB — HM MAMMOGRAPHY: HM Mammogram: NORMAL

## 2011-12-17 ENCOUNTER — Encounter: Payer: Self-pay | Admitting: Internal Medicine

## 2012-02-08 ENCOUNTER — Encounter: Payer: Self-pay | Admitting: Internal Medicine

## 2012-02-08 ENCOUNTER — Ambulatory Visit (INDEPENDENT_AMBULATORY_CARE_PROVIDER_SITE_OTHER): Payer: Medicare Other | Admitting: Internal Medicine

## 2012-02-08 VITALS — BP 122/72 | HR 78 | Temp 98.2°F | Resp 12 | Ht 65.0 in | Wt 274.0 lb

## 2012-02-08 DIAGNOSIS — E669 Obesity, unspecified: Secondary | ICD-10-CM

## 2012-02-08 DIAGNOSIS — E785 Hyperlipidemia, unspecified: Secondary | ICD-10-CM | POA: Diagnosis not present

## 2012-02-08 DIAGNOSIS — E119 Type 2 diabetes mellitus without complications: Secondary | ICD-10-CM | POA: Diagnosis not present

## 2012-02-08 DIAGNOSIS — I10 Essential (primary) hypertension: Secondary | ICD-10-CM

## 2012-02-08 DIAGNOSIS — E1169 Type 2 diabetes mellitus with other specified complication: Secondary | ICD-10-CM

## 2012-02-08 LAB — COMPREHENSIVE METABOLIC PANEL
Albumin: 4.2 g/dL (ref 3.5–5.2)
BUN: 18 mg/dL (ref 6–23)
CO2: 28 mEq/L (ref 19–32)
Calcium: 9.5 mg/dL (ref 8.4–10.5)
Chloride: 104 mEq/L (ref 96–112)
Glucose, Bld: 108 mg/dL — ABNORMAL HIGH (ref 70–99)
Potassium: 4.6 mEq/L (ref 3.5–5.1)
Sodium: 140 mEq/L (ref 135–145)
Total Protein: 7.4 g/dL (ref 6.0–8.3)

## 2012-02-08 LAB — MICROALBUMIN / CREATININE URINE RATIO
Creatinine,U: 159 mg/dL
Microalb Creat Ratio: 1.1 mg/g (ref 0.0–30.0)
Microalb, Ur: 1.7 mg/dL (ref 0.0–1.9)

## 2012-02-08 LAB — LIPID PANEL: Cholesterol: 166 mg/dL (ref 0–200)

## 2012-02-08 LAB — HEMOGLOBIN A1C: Hgb A1c MFr Bld: 6.1 % (ref 4.6–6.5)

## 2012-02-08 MED ORDER — ASPIRIN 81 MG PO TABS: 81.0000 mg | ORAL_TABLET | Freq: Every day | ORAL | Status: AC

## 2012-02-08 NOTE — Patient Instructions (Addendum)
Please take a baby aspirin daily  Please get your diabetes eye exam ASAP

## 2012-02-08 NOTE — Progress Notes (Signed)
Patient ID: Brenda Floyd, female   DOB: 1940-04-27, 71 y.o.   MRN: [id number]  Patient Active Problem List  Diagnosis  . Diverticulosis of sigmoid colon  . Diabetes mellitus type 2 in obese  . Hypertension  . Obesity (BMI 30-39.9)  . Hyperlipidemia LDL goal < 70  . Degenerative disk disease    Subjective:  CC:   Chief Complaint  Patient presents with  . Follow-up    HPI:   Brenda Floyd a 71 y.o. female who presents 3 month follow up on diabetes, obesity and hyperlipidemia.  She has been having watery bowel movements occurring intermittently for the last several months.  She has  2 or 3 per day but not daily more likely weekly.  The loose stools are aggravated by fatty foods .  She has tried experimenting with gluten free diet but has been dissatisfied with the choices.  Her fasting blood sugars have been averaging from 104 to 96.  No lows .   She is using 30 units lantus daily.   Past Medical History  Diagnosis Date  . Colon polyps 2012    [facility], followup due 2015  . Diverticulosis of sigmoid colon July 2012    by colonoscopy  . Fracture closed, femur, shaft remote    s/p screws and rod repair  . Diabetes mellitus   . Hypertension   . Thyroid disease     Past Surgical History  Procedure Date  . Joint replacement     bilateral knee, 2011         The following portions of the patient's history were reviewed and updated as appropriate: Allergies, current medications, and problem list.    Review of Systems:   12 Pt  review of systems was negative except those addressed in the HPI,     History   Social History  . Marital Status: Single    Spouse Name: N/A    Number of Children: N/A  . Years of Education: N/A   Occupational History  . Not on file.   Social History Main Topics  . Smoking status: Former Smoker    Quit date: 10/30/1990  . Smokeless tobacco: Never Used  . Alcohol Use: No  . Drug Use: No  . Sexually Active: Not on file    Other Topics Concern  . Not on file   Social History Narrative  . No narrative on file    Objective:  BP 122/72  Pulse 78  Temp 98.2 F (36.8 C) (Oral)  Resp 12  Ht 5\' 5"  (1.651 m)  Wt 274 lb (124.286 kg)  BMI 45.60 kg/m2  SpO2 95%  General appearance: alert, cooperative and appears stated age Ears: normal TM's and external ear canals both ears Throat: lips, mucosa, and tongue normal; teeth and gums normal Neck: no adenopathy, no carotid bruit, supple, symmetrical, trachea midline and thyroid not enlarged, symmetric, no tenderness/mass/nodules Back: symmetric, no curvature. ROM normal. No CVA tenderness. Lungs: clear to auscultation bilaterally Heart: regular rate and rhythm, S1, S2 normal, no murmur, click, rub or gallop Abdomen: soft, non-tender; bowel sounds normal; no masses,  no organomegaly Pulses: 2+ and symmetric Skin: Skin color, texture, turgor normal. No rashes or lesions Lymph nodes: Cervical, supraclavicular, and axillary nodes normal. Foot exam:  Nails are well trimmed,  No callouses,  Sensation intact to microfilament. pulses normal.  Assessment and Plan:  Diabetes mellitus type 2 in obese Well controlled historically with low GI diet and lantus,  hgba1c 6.21. Foot exam  normal.  overedue to eye exam.  Reminded to take baby aspirin daily  Hyperlipidemia LDL goal < 70 LDL < 70 on crestor dose.  LFTs stable.,  No changes today  Obesity (BMI 30-39.9) Her BMI remains unchanged despite exercise. We discussed the nature and quality of her exercises well as of her diet. Usually what I find is that people are not exercising as vigorously as they should to achieve a sustained heart rate in the aerobic zone. I suggested that she consider joining a gym and using a personal trainer to help guide her efforts.   I also am advising her to get back on the low GI diet using six smaller meals a day to stimulate her metabolism.    Hypertension Well controlled on current  regimen of lisinopril. Renal function stable, no changes today.   Updated Medication List Outpatient Encounter Prescriptions as of 02/08/2012  Medication Sig Dispense Refill  . acetaminophen (TYLENOL) 325 MG tablet Take 650 mg by mouth as needed.        Marland Kitchen amoxicillin (AMOXIL) 500 MG capsule TAKE 4 CAPSULES BY MOUTH 1 HOUR PRIOR TO DENTAL PROCEDURE  4 capsule  1  . Cholecalciferol (VITAMIN D3) 5000 UNITS CAPS Take 1 tablet by mouth daily.        . Coenzyme Q10 (COQ10) 100 MG CAPS Take 1 tablet by mouth daily.        . diphenhydrAMINE (BENADRYL) 25 mg capsule Take 25 mg by mouth as needed.        . fexofenadine (ALLEGRA) 180 MG tablet Take 180 mg by mouth daily.      . insulin glargine (LANTUS SOLOSTAR) 100 UNIT/ML injection       . levothyroxine (SYNTHROID, LEVOTHROID) 75 MCG tablet TAKE 1 TABLET DAILY FOR HYPOTHYROIDISM  90 tablet  3  . lisinopril (PRINIVIL,ZESTRIL) 20 MG tablet TAKE ONE TABLET BY MOUTH EVERY DAY  90 tablet  3  . meloxicam (MOBIC) 15 MG tablet TAKE ONE TABLET BY MOUTH EVERY DAY  90 tablet  3  . metFORMIN (GLUCOPHAGE-XR) 750 MG 24 hr tablet TAKE 1 TABLET THREE TIMES A DAY  180 tablet  3  . PARoxetine (PAXIL) 20 MG tablet TAKE 1 TABLET DAILY  90 tablet  2  . rosuvastatin (CRESTOR) 10 MG tablet Take 1 tablet (10 mg total) by mouth daily.  90 tablet  3  . traMADol (ULTRAM) 50 MG tablet TAKE 1 TABLET FOUR TIMES A DAY AS NEEDED FOR KNEE PAIN  360 tablet  2  . ampicillin (PRINCIPEN) 500 MG capsule Take 4 capsules one hour prior to dental procedure  4 capsule  3  . aspirin 81 MG tablet Take 1 tablet (81 mg total) by mouth daily.  30 tablet  11

## 2012-02-08 NOTE — Assessment & Plan Note (Addendum)
Well controlled historically with low GI diet and lantus,  hgba1c 6.21. Foot exam normal.  overedue to eye exam.  Reminded to take baby aspirin daily

## 2012-02-08 NOTE — Assessment & Plan Note (Signed)
Her BMI remains unchanged despite exercise. We discussed the nature and quality of her exercises well as of her diet. Usually what I find is that people are not exercising as vigorously as they should to achieve a sustained heart rate in the aerobic zone. I suggested that she consider joining a gym and using a personal trainer to help guide her efforts.   I also am advising her to get back on the low GI diet using six smaller meals a day to stimulate her metabolism.   

## 2012-02-08 NOTE — Assessment & Plan Note (Signed)
LDL < 70 on crestor dose.  LFTs stable.,  No changes today

## 2012-02-08 NOTE — Assessment & Plan Note (Signed)
Well controlled on current regimen of lisinopril. Renal function stable, no changes today.

## 2012-02-11 ENCOUNTER — Encounter: Payer: Self-pay | Admitting: Internal Medicine

## 2012-02-11 ENCOUNTER — Other Ambulatory Visit: Payer: Self-pay | Admitting: Internal Medicine

## 2012-02-11 DIAGNOSIS — Z298 Encounter for other specified prophylactic measures: Secondary | ICD-10-CM

## 2012-02-11 NOTE — Telephone Encounter (Signed)
No refills permitted.  

## 2012-02-12 MED ORDER — AMOXICILLIN 500 MG PO CAPS
2000.0000 mg | ORAL_CAPSULE | Freq: Once | ORAL | Status: DC
Start: 2012-02-12 — End: 2012-05-09

## 2012-03-07 ENCOUNTER — Other Ambulatory Visit: Payer: Self-pay | Admitting: Internal Medicine

## 2012-03-07 NOTE — Telephone Encounter (Signed)
Med filled.  

## 2012-05-03 ENCOUNTER — Other Ambulatory Visit: Payer: Self-pay | Admitting: Internal Medicine

## 2012-05-03 NOTE — Telephone Encounter (Signed)
Med filled.  

## 2012-05-09 ENCOUNTER — Encounter: Payer: Self-pay | Admitting: Internal Medicine

## 2012-05-09 ENCOUNTER — Ambulatory Visit (INDEPENDENT_AMBULATORY_CARE_PROVIDER_SITE_OTHER): Payer: Medicare Other | Admitting: Internal Medicine

## 2012-05-09 VITALS — BP 126/66 | HR 78 | Temp 97.6°F | Resp 18 | Wt 272.2 lb

## 2012-05-09 DIAGNOSIS — E119 Type 2 diabetes mellitus without complications: Secondary | ICD-10-CM

## 2012-05-09 DIAGNOSIS — E669 Obesity, unspecified: Secondary | ICD-10-CM | POA: Diagnosis not present

## 2012-05-09 LAB — LIPID PANEL
HDL: 54.6 mg/dL (ref >39.00)
Triglycerides: 240 mg/dL — ABNORMAL HIGH (ref 0.0–149.0)

## 2012-05-09 LAB — COMPREHENSIVE METABOLIC PANEL
CO2: 23 mEq/L (ref 19–32)
Calcium: 9.4 mg/dL (ref 8.4–10.5)
Chloride: 100 mEq/L (ref 96–112)
Creatinine, Ser: 0.8 mg/dL (ref 0.4–1.2)
GFR: 74.57 mL/min (ref >60.00)
Glucose, Bld: 90 mg/dL (ref 70–99)
Sodium: 136 mEq/L (ref 135–145)
Total Bilirubin: 0.5 mg/dL (ref 0.3–1.2)
Total Protein: 7.4 g/dL (ref 6.0–8.3)

## 2012-05-09 LAB — HM DIABETES FOOT EXAM: HM Diabetic Foot Exam: NORMAL

## 2012-05-09 NOTE — Progress Notes (Signed)
Patient ID: Brenda Floyd, female   DOB: 25-Mar-1940, 72 y.o.   MRN: [id number]   Patient Active Problem List  Diagnosis  . Diverticulosis of sigmoid colon  . Diabetes mellitus type 2 in obese  . Hypertension  . Obesity (BMI 30-39.9)  . Hyperlipidemia LDL goal < 70  . Degenerative disk disease    Subjective:  CC:   Chief Complaint  Patient presents with  . Follow-up    HPI:   Brenda Floyd a 72 y.o. female who presents 3 month follow up on dm and obesity. She has not been following a low glycemic index diet for the last several months. She has noted increased blood sugars and has increased her Lantus recently. She's been following a gluten-free diet per preference of her family.   Regarding her obesity , she has lost 6 lbs since sept.   She is not exercising due to bilateral knee pain and more recent development of left shoulder aching. She has had several minor falls and feels that the pain is more related to her is constant use of her Kindle with outstretched left  arm.  She has been in a position to support her Kindle using a pillow. She is using tramadol for pain control.   Past Medical History  Diagnosis Date  . Colon polyps 2012    [facility], followup due 2015  . Diverticulosis of sigmoid colon July 2012    by colonoscopy  . Fracture closed, femur, shaft remote    s/p screws and rod repair  . Diabetes mellitus   . Hypertension   . Thyroid disease     Past Surgical History  Procedure Laterality Date  . Joint replacement      bilateral knee, 2011       The following portions of the patient's history were reviewed and updated as appropriate: Allergies, current medications, and problem list.    Review of Systems:   Patient denies headache, fevers, malaise, unintentional weight loss, skin rash, eye pain, sinus congestion and sinus pain, sore throat, dysphagia,  hemoptysis , cough, dyspnea, wheezing, chest pain, palpitations, orthopnea, edema, abdominal pain,  nausea, melena, diarrhea, constipation, flank pain, dysuria, hematuria, urinary  Frequency, nocturia, numbness, tingling, seizures,  Focal weakness, Loss of consciousness,  Tremor, insomnia, depression, anxiety, and suicidal ideation.     History   Social History  . Marital Status: Single    Spouse Name: N/A    Number of Children: N/A  . Years of Education: N/A   Occupational History  . Not on file.   Social History Main Topics  . Smoking status: Former Smoker    Quit date: 10/30/1990  . Smokeless tobacco: Never Used  . Alcohol Use: No  . Drug Use: No  . Sexually Active: Not on file   Other Topics Concern  . Not on file   Social History Narrative  . No narrative on file    Objective:  BP 126/66  Pulse 78  Temp(Src) 97.6 F (36.4 C) (Oral)  Resp 18  Wt 272 lb 4 oz (123.492 kg)  BMI 45.3 kg/m2  SpO2 93%  General appearance: alert, cooperative and appears stated age Ears: normal TM's and external ear canals both ears Throat: lips, mucosa, and tongue normal; teeth and gums normal Neck: no adenopathy, no carotid bruit, supple, symmetrical, trachea midline and thyroid not enlarged, symmetric, no tenderness/mass/nodules Back: symmetric, no curvature. ROM normal. No CVA tenderness. Lungs: clear to auscultation bilaterally Heart: regular rate and rhythm, S1, S2 normal,  no murmur, click, rub or gallop Abdomen: soft, non-tender; bowel sounds normal; no masses,  no organomegaly Pulses: 2+ and symmetric Skin: Skin color, texture, turgor normal. No rashes or lesions Lymph nodes: Cervical, supraclavicular, and axillary nodes normal. Foot exam:  Nails are well trimmed,  No callouses,  Sensation intact to microfilament  Assessment and Plan:  Diabetes mellitus type 2 in obese She reports that Her control has slipped since she has been following a gluten-free diet per family preference. She has increased her Lantus to 50 units now but I did this about a week ago. Hemoglobin A1c  is pending. No changes yet.  foot exam is normal today. He is up-to-date on annual eye exams.  Degenerative disk disease She has chronic low back pain managed with tramadol. Her pain is nonradiating so no MRI has been done recently.  Obesity (BMI 30-39.9) She reached a nadir BMI of 42 in June 2013 on a low glycemic index diet but has gained significant amount of weight and BMI is now 45. Low GI diet again recommended.   Updated Medication List Outpatient Encounter Prescriptions as of 05/09/2012  Medication Sig Dispense Refill  . acetaminophen (TYLENOL) 325 MG tablet Take 650 mg by mouth as needed.        Marland Kitchen aspirin 81 MG tablet Take 1 tablet (81 mg total) by mouth daily.  30 tablet  11  . Cholecalciferol (VITAMIN D3) 5000 UNITS CAPS Take 1 tablet by mouth daily.        . Coenzyme Q10 (COQ10) 100 MG CAPS Take 1 tablet by mouth daily.        . CRESTOR 10 MG tablet TAKE 1 TABLET DAILY  90 tablet  2  . diphenhydrAMINE (BENADRYL) 25 mg capsule Take 25 mg by mouth as needed.        . fexofenadine (ALLEGRA) 180 MG tablet Take 180 mg by mouth daily.      . insulin glargine (LANTUS SOLOSTAR) 100 UNIT/ML injection       . levothyroxine (SYNTHROID, LEVOTHROID) 75 MCG tablet TAKE 1 TABLET DAILY FOR HYPOTHYROIDISM  90 tablet  3  . lisinopril (PRINIVIL,ZESTRIL) 20 MG tablet TAKE ONE TABLET BY MOUTH EVERY DAY  90 tablet  3  . meloxicam (MOBIC) 15 MG tablet TAKE ONE TABLET BY MOUTH EVERY DAY  90 tablet  3  . metFORMIN (GLUCOPHAGE-XR) 750 MG 24 hr tablet TAKE 1 TABLET THREE TIMES A DAY  180 tablet  2  . PARoxetine (PAXIL) 20 MG tablet TAKE 1 TABLET DAILY  90 tablet  2  . traMADol (ULTRAM) 50 MG tablet TAKE 1 TABLET FOUR TIMES A DAY AS NEEDED FOR KNEE PAIN  360 tablet  2  . ampicillin (PRINCIPEN) 500 MG capsule Take 4 capsules one hour prior to dental procedure  4 capsule  3  . [DISCONTINUED] amoxicillin (AMOXIL) 500 MG capsule Take 4 capsules (2,000 mg total) by mouth once.  4 capsule  3   No  facility-administered encounter medications on file as of 05/09/2012.     Orders Placed This Encounter  Procedures  . Lipid panel  . Hemoglobin A1c  . Comprehensive metabolic panel  . HM DIABETES FOOT EXAM    No Follow-up on file.

## 2012-05-09 NOTE — Assessment & Plan Note (Signed)
She reached a nadir BMI of 42 in June 2013 on a low glycemic index diet but has gained significant amount of weight and BMI is now 45. Low GI diet again recommended.

## 2012-05-09 NOTE — Assessment & Plan Note (Addendum)
She reports that Her control has slipped since she has been following a gluten-free diet per family preference. She has increased her Lantus to 50 units now but I did this about a week ago. Hemoglobin A1c is pending. No changes yet.  foot exam is normal today. He is up-to-date on annual eye exams.

## 2012-05-09 NOTE — Assessment & Plan Note (Signed)
She has chronic low back pain managed with tramadol. Her pain is nonradiating so no MRI has been done recently.

## 2012-05-10 ENCOUNTER — Encounter: Payer: Self-pay | Admitting: Internal Medicine

## 2012-05-14 ENCOUNTER — Other Ambulatory Visit: Payer: Self-pay | Admitting: Internal Medicine

## 2012-05-14 DIAGNOSIS — E669 Obesity, unspecified: Secondary | ICD-10-CM

## 2012-05-17 ENCOUNTER — Encounter: Payer: Self-pay | Admitting: Internal Medicine

## 2012-05-19 ENCOUNTER — Other Ambulatory Visit: Payer: Self-pay | Admitting: Internal Medicine

## 2012-06-09 ENCOUNTER — Encounter: Payer: Self-pay | Admitting: Internal Medicine

## 2012-06-23 ENCOUNTER — Encounter: Payer: Self-pay | Admitting: Internal Medicine

## 2012-07-21 ENCOUNTER — Other Ambulatory Visit: Payer: Self-pay | Admitting: Internal Medicine

## 2012-07-26 ENCOUNTER — Other Ambulatory Visit: Payer: Self-pay | Admitting: Internal Medicine

## 2012-08-08 ENCOUNTER — Encounter: Payer: Self-pay | Admitting: Internal Medicine

## 2012-08-08 ENCOUNTER — Ambulatory Visit (INDEPENDENT_AMBULATORY_CARE_PROVIDER_SITE_OTHER): Payer: Medicare Other | Admitting: Internal Medicine

## 2012-08-08 VITALS — BP 132/76 | HR 76 | Temp 98.2°F | Resp 14 | Wt 276.0 lb

## 2012-08-08 DIAGNOSIS — E119 Type 2 diabetes mellitus without complications: Secondary | ICD-10-CM | POA: Diagnosis not present

## 2012-08-08 DIAGNOSIS — E669 Obesity, unspecified: Secondary | ICD-10-CM

## 2012-08-08 DIAGNOSIS — R5383 Other fatigue: Secondary | ICD-10-CM | POA: Diagnosis not present

## 2012-08-08 DIAGNOSIS — R5381 Other malaise: Secondary | ICD-10-CM

## 2012-08-08 DIAGNOSIS — E785 Hyperlipidemia, unspecified: Secondary | ICD-10-CM

## 2012-08-08 DIAGNOSIS — I10 Essential (primary) hypertension: Secondary | ICD-10-CM

## 2012-08-08 DIAGNOSIS — E1169 Type 2 diabetes mellitus with other specified complication: Secondary | ICD-10-CM

## 2012-08-08 LAB — CBC WITH DIFFERENTIAL/PLATELET
Basophils Relative: 0.5 % (ref 0.0–3.0)
Eosinophils Absolute: 0.4 10*3/uL (ref 0.0–0.7)
Eosinophils Relative: 7.7 % — ABNORMAL HIGH (ref 0.0–5.0)
Hemoglobin: 13.5 g/dL (ref 12.0–15.0)
Lymphocytes Relative: 29.7 % (ref 12.0–46.0)
MCHC: 32.8 g/dL (ref 30.0–36.0)
Monocytes Relative: 9.3 % (ref 3.0–12.0)
Neutro Abs: 2.8 10*3/uL (ref 1.4–7.7)
Neutrophils Relative %: 52.8 % (ref 43.0–77.0)
RBC: 4.22 Mil/uL (ref 3.87–5.11)
WBC: 5.3 10*3/uL (ref 4.5–10.5)

## 2012-08-08 LAB — LIPID PANEL
HDL: 61.4 mg/dL (ref >39.00)
Triglycerides: 236 mg/dL — ABNORMAL HIGH (ref 0.0–149.0)

## 2012-08-08 LAB — COMPREHENSIVE METABOLIC PANEL
ALT: 23 U/L (ref 0–35)
BUN: 23 mg/dL (ref 6–23)
Chloride: 103 mEq/L (ref 96–112)
Creatinine, Ser: 0.8 mg/dL (ref 0.4–1.2)
GFR: 70.44 mL/min (ref >60.00)
Potassium: 4.9 mEq/L (ref 3.5–5.1)
Sodium: 137 mEq/L (ref 135–145)
Total Bilirubin: 0.4 mg/dL (ref 0.3–1.2)

## 2012-08-08 LAB — HEMOGLOBIN A1C: Hgb A1c MFr Bld: 6 % (ref 4.6–6.5)

## 2012-08-08 LAB — MICROALBUMIN / CREATININE URINE RATIO
Creatinine,U: 53.6 mg/dL
Microalb, Ur: 0.5 mg/dL (ref 0.0–1.9)

## 2012-08-08 LAB — HM DIABETES FOOT EXAM: HM Diabetic Foot Exam: NORMAL

## 2012-08-08 MED ORDER — GLUCOSE BLOOD VI STRP: ORAL_STRIP | Status: DC

## 2012-08-08 NOTE — Progress Notes (Signed)
Patient ID: Brenda Floyd, female   DOB: October 12, 1940, 72 y.o.   MRN: [id number]  Patient Active Problem List   Diagnosis Date Noted  . Degenerative disk disease 02/01/2011  . Hyperlipidemia LDL goal < 70 11/01/2010  . Diabetes mellitus type 2 in obese   . Hypertension   . Obesity (BMI 30-39.9)   . Diverticulosis of sigmoid colon     Subjective:  CC:   Chief Complaint  Patient presents with  . Follow-up    3 month    HPI:   Brenda Floyd a 72 y.o. female who presents3 month follow up on diabetes mellitus with obesity and hyperlipidemia, ,  DJD with prior knee replacements.  She feels generally well except for joint pain.   She has been using 38 units of insulin daily (Lantus) and her  fastings have been 79  To low 80s,  Post prandials < 120.  Following a low GI diet,  Exercise has been limited by jjoint pain but finally able to use her swimming pool.       Past Medical History  Diagnosis Date  . Colon polyps 2012    [facility], followup due 2015  . Diverticulosis of sigmoid colon July 2012    by colonoscopy  . Fracture closed, femur, shaft remote    s/p screws and rod repair  . Diabetes mellitus   . Hypertension   . Thyroid disease     Past Surgical History  Procedure Laterality Date  . Joint replacement      bilateral knee, 2011  . Abdominal hysterectomy         The following portions of the patient's history were reviewed and updated as appropriate: Allergies, current medications, and problem list.    Review of Systems:   12 Pt  review of systems was negative except those addressed in the HPI,     History   Social History  . Marital Status: Single    Spouse Name: N/A    Number of Children: N/A  . Years of Education: N/A   Occupational History  . Not on file.   Social History Main Topics  . Smoking status: Former Smoker    Quit date: 10/30/1990  . Smokeless tobacco: Never Used  . Alcohol Use: No  . Drug Use: No  . Sexually Active: Not on  file   Other Topics Concern  . Not on file   Social History Narrative  . No narrative on file    Objective:  BP 132/76  Pulse 76  Temp(Src) 98.2 F (36.8 C) (Oral)  Resp 14  Wt 276 lb (125.193 kg)  BMI 45.93 kg/m2  SpO2 97%  General appearance: alert, cooperative and appears stated age Ears: normal TM's and external ear canals both ears Throat: lips, mucosa, and tongue normal; teeth and gums normal Neck: no adenopathy, no carotid bruit, supple, symmetrical, trachea midline and thyroid not enlarged, symmetric, no tenderness/mass/nodules Back: symmetric, no curvature. ROM normal. No CVA tenderness. Lungs: clear to auscultation bilaterally Heart: regular rate and rhythm, S1, S2 normal, no murmur, click, rub or gallop Abdomen: soft, non-tender; bowel sounds normal; no masses,  no organomegaly Pulses: 2+ and symmetric Skin: Skin color, texture, turgor normal. No rashes or lesions Lymph nodes: Cervical, supraclavicular, and axillary nodes normal.  Assessment and Plan:  Diabetes mellitus type 2 in obese  She has decreased her Lantus to 38 units now ; Well-controlled on current medications.  hemoglobin A1c has been consistently less than 7.0 . He is  up-to-date on eye exams and his foot exam is normal. reminder for annual eye exam given   Hyperlipidemia LDL goal < 70 Well controlled on current statin therapy.   Liver enzymes are normal , no changes today.  Hypertension Well controlled on current regimen. Renal function stable, no changes today.  Obesity (BMI 30-39.9) I have addressed  BMI and recommended a low glycemic index diet utilizing smaller more frequent meals to increase metabolism.  I have also recommended that patient start exercising with a goal of 30 minutes of aerobic exercise a minimum of 5 days per week.    Updated Medication List Outpatient Encounter Prescriptions as of 08/08/2012  Medication Sig Dispense Refill  . acetaminophen (TYLENOL) 325 MG tablet Take 650  mg by mouth as needed.        . Cholecalciferol (VITAMIN D3) 5000 UNITS CAPS Take 1 tablet by mouth daily.        . Coenzyme Q10 (COQ10) 100 MG CAPS Take 1 tablet by mouth daily.        . CRESTOR 10 MG tablet TAKE 1 TABLET DAILY  90 tablet  2  . diphenhydrAMINE (BENADRYL) 25 mg capsule Take 25 mg by mouth as needed.        . fexofenadine (ALLEGRA) 180 MG tablet Take 180 mg by mouth daily.      . insulin glargine (LANTUS SOLOSTAR) 100 UNIT/ML injection       . LANTUS SOLOSTAR 100 UNIT/ML injection INJECT 55 UNITS UNDER THE SKIN DAILY  30 mL  2  . levothyroxine (SYNTHROID, LEVOTHROID) 75 MCG tablet TAKE 1 TABLET DAILY FOR HYPOTHYROIDISM  90 tablet  2  . lisinopril (PRINIVIL,ZESTRIL) 20 MG tablet TAKE ONE TABLET BY MOUTH EVERY DAY  90 tablet  1  . meloxicam (MOBIC) 15 MG tablet TAKE ONE TABLET BY MOUTH EVERY DAY  90 tablet  3  . metFORMIN (GLUCOPHAGE-XR) 750 MG 24 hr tablet TAKE 1 TABLET THREE TIMES A DAY  180 tablet  2  . PARoxetine (PAXIL) 20 MG tablet TAKE 1 TABLET DAILY  90 tablet  1  . traMADol (ULTRAM) 50 MG tablet TAKE 1 TABLET FOUR TIMES A DAY AS NEEDED FOR KNEE PAIN  360 tablet  2  . ampicillin (PRINCIPEN) 500 MG capsule Take 4 capsules one hour prior to dental procedure  4 capsule  3  . aspirin 81 MG tablet Take 1 tablet (81 mg total) by mouth daily.  30 tablet  11  . glucose blood (FREESTYLE LITE) test strip Use to check blood sugars once daily,  Twice if needed   DM 250.00  102 each  3   No facility-administered encounter medications on file as of 08/08/2012.     Orders Placed This Encounter  Procedures  . HM MAMMOGRAPHY  . Hemoglobin A1c  . Lipid panel  . Microalbumin / creatinine urine ratio  . Comprehensive metabolic panel  . CBC with Differential  . LDL cholesterol, direct  . Ambulatory referral to Ophthalmology  . HM DIABETES FOOT EXAM    No Follow-up on file.

## 2012-08-08 NOTE — Patient Instructions (Addendum)
please start taking a baby aspirin daily   Your weight gain is from a slow metabolism.  Eat something every 3 hours to prevent fasting states and low metabolic rate  Trial of flector patch on your lateral knee for treatment of tendonitis.  If it helps we can try the gel version (difloenac) which wil be less expensive

## 2012-08-09 ENCOUNTER — Encounter: Payer: Self-pay | Admitting: Internal Medicine

## 2012-08-09 NOTE — Assessment & Plan Note (Signed)
I have addressed  BMI and recommended a low glycemic index diet utilizing smaller more frequent meals to increase metabolism.  I have also recommended that patient start exercising with a goal of 30 minutes of aerobic exercise a minimum of 5 days per week.  

## 2012-08-09 NOTE — Assessment & Plan Note (Signed)
Well controlled on current statin therapy.   Liver enzymes are normal , no changes today.  

## 2012-08-09 NOTE — Assessment & Plan Note (Signed)
She has decreased her Lantus to 38 units now ; Well-controlled on current medications.  hemoglobin A1c has been consistently less than 7.0 . He is up-to-date on eye exams and his foot exam is normal. reminder for annual eye exam given

## 2012-08-09 NOTE — Assessment & Plan Note (Signed)
Well controlled on current regimen. Renal function stable, no changes today. 

## 2012-08-12 ENCOUNTER — Other Ambulatory Visit: Payer: Self-pay | Admitting: Internal Medicine

## 2012-08-15 ENCOUNTER — Encounter: Payer: Self-pay | Admitting: Internal Medicine

## 2012-08-15 MED ORDER — MELOXICAM 15 MG PO TABS: ORAL_TABLET | ORAL | Status: DC

## 2012-08-19 DIAGNOSIS — E119 Type 2 diabetes mellitus without complications: Secondary | ICD-10-CM | POA: Diagnosis not present

## 2012-09-07 LAB — HM DIABETES EYE EXAM

## 2012-09-14 ENCOUNTER — Other Ambulatory Visit: Payer: Self-pay

## 2012-10-08 LAB — HM DIABETES EYE EXAM: HM Diabetic Eye Exam: NORMAL

## 2012-10-12 ENCOUNTER — Other Ambulatory Visit: Payer: Self-pay | Admitting: Internal Medicine

## 2012-11-08 ENCOUNTER — Encounter: Payer: Self-pay | Admitting: Internal Medicine

## 2012-11-08 ENCOUNTER — Ambulatory Visit (INDEPENDENT_AMBULATORY_CARE_PROVIDER_SITE_OTHER): Payer: Medicare Other | Admitting: Internal Medicine

## 2012-11-08 VITALS — BP 132/84 | HR 81 | Temp 97.7°F | Resp 14 | Ht 65.0 in | Wt 271.2 lb

## 2012-11-08 DIAGNOSIS — I10 Essential (primary) hypertension: Secondary | ICD-10-CM | POA: Diagnosis not present

## 2012-11-08 DIAGNOSIS — R159 Full incontinence of feces: Secondary | ICD-10-CM

## 2012-11-08 DIAGNOSIS — Z23 Encounter for immunization: Secondary | ICD-10-CM

## 2012-11-08 DIAGNOSIS — E785 Hyperlipidemia, unspecified: Secondary | ICD-10-CM | POA: Diagnosis not present

## 2012-11-08 DIAGNOSIS — E669 Obesity, unspecified: Secondary | ICD-10-CM | POA: Diagnosis not present

## 2012-11-08 DIAGNOSIS — E1169 Type 2 diabetes mellitus with other specified complication: Secondary | ICD-10-CM

## 2012-11-08 DIAGNOSIS — Z1239 Encounter for other screening for malignant neoplasm of breast: Secondary | ICD-10-CM

## 2012-11-08 DIAGNOSIS — R252 Cramp and spasm: Secondary | ICD-10-CM | POA: Diagnosis not present

## 2012-11-08 DIAGNOSIS — E119 Type 2 diabetes mellitus without complications: Secondary | ICD-10-CM | POA: Diagnosis not present

## 2012-11-08 DIAGNOSIS — K5900 Constipation, unspecified: Secondary | ICD-10-CM | POA: Insufficient documentation

## 2012-11-08 LAB — COMPREHENSIVE METABOLIC PANEL
ALT: 28 U/L (ref 0–35)
Albumin: 4.1 g/dL (ref 3.5–5.2)
CO2: 26 mEq/L (ref 19–32)
Calcium: 9.6 mg/dL (ref 8.4–10.5)
Chloride: 106 mEq/L (ref 96–112)
Creatinine, Ser: 0.8 mg/dL (ref 0.4–1.2)
GFR: 75.56 mL/min (ref >60.00)
Potassium: 4.4 mEq/L (ref 3.5–5.1)
Sodium: 139 mEq/L (ref 135–145)
Total Bilirubin: 0.6 mg/dL (ref 0.3–1.2)
Total Protein: 6.7 g/dL (ref 6.0–8.3)

## 2012-11-08 LAB — LIPID PANEL
Cholesterol: 160 mg/dL (ref 0–200)
HDL: 57.6 mg/dL (ref >39.00)
Triglycerides: 240 mg/dL — ABNORMAL HIGH (ref 0.0–149.0)
VLDL: 48 mg/dL — ABNORMAL HIGH (ref 0.0–40.0)

## 2012-11-08 LAB — MICROALBUMIN / CREATININE URINE RATIO: Creatinine,U: 104.9 mg/dL

## 2012-11-08 LAB — MAGNESIUM: Magnesium: 1.9 mg/dL (ref 1.5–2.5)

## 2012-11-08 NOTE — Patient Instructions (Addendum)
The Dreamfield's semolina pasta tastes great and will not affect your blood sugars.   Dannon Light n Fit Greek Yogurt  80 cal /8 carbs  Food [employer]  have the best variety     Suspend the crestor for 3 months   Resume at every other day IF THE CRAMPS RESOLVED.  IF THEY DID NOT RESUME DAILY

## 2012-11-08 NOTE — Assessment & Plan Note (Signed)
Well-controlled on current medications.  hemoglobin A1c has been consistently less than 7.0 . Se is up-to-date on eye exams and his foot exam is norma. Normal urine microalbumin to creatinine ratio\ She is on the appropriate medications

## 2012-11-08 NOTE — Assessment & Plan Note (Signed)
Well controlled on current regimen. Renal function stable, no changes today. 

## 2012-11-08 NOTE — Assessment & Plan Note (Addendum)
Discussed temporarily suspended from to see if her fecal incontinence and loose stools improved. She is scheduled to have a colonoscopy next week.

## 2012-11-08 NOTE — Progress Notes (Signed)
Patient ID: Brenda Floyd, female   DOB: Apr 25, 1940, 72 y.o.   MRN: [id number] Patient Active Problem List   Diagnosis Date Noted  . Muscle cramps at night 11/08/2012  . Fecal incontinence alternating with constipation 11/08/2012  . Degenerative disk disease 02/01/2011  . Hyperlipidemia LDL goal < 70 11/01/2010  . Diabetes mellitus type 2 in obese   . Hypertension   . Obesity (BMI 30-39.9)   . Diverticulosis of sigmoid colon     Subjective:  CC:   Chief Complaint  Patient presents with  . Follow-up    3 month patient scheduled for colonoscopy next week.    HPI:   Brenda Floyd a 72 y.o. female who presents 3 month follow up on DM,  Obesity,  Hyperlipidemia and hypertension,  and urinary and fecal  incontinence. Wants flu shot .    1) DM: She Has increased Lantus dose to 40 units daily and has had no hypoglycemic events. She is following a gluten-free diet but only out of curiosity, not because of any gluten sensitivity. She's become bored with diet and is planning on getting up.    2) she has been wearing adult pads due to Mixed stress and urge incontinence. She is status post hysterectomy. Her sister had good results with a pessary so she is planning on seeing OB/GYN Dr. Oletha Blend in [place] who treated her sister.     3) Fecal incontinence once or twice weekly,  occurs only with loose stools   4) Obesity: She has lost 5 lbs since  last visit  which she attributes to her loose stools .  Having loose stools one or two times daily, no cramping.  Alternates watery bowel movements and normal stools. She denies any cramping nausea or fevers. No recent use of antibiotics. No recent travel. She does have several dozen farm animals including ducts these chickens and dogs.  5) Leg cramps at night,  Sometimes severe. Do not occur with walking or exertion.    Past Medical History  Diagnosis Date  . Colon polyps 2012    [facility], followup due 2015  . Diverticulosis of sigmoid  colon July 2012    by colonoscopy  . Fracture closed, femur, shaft remote    s/p screws and rod repair  . Diabetes mellitus   . Hypertension   . Thyroid disease     Past Surgical History  Procedure Laterality Date  . Joint replacement      bilateral knee, 2011  . Abdominal hysterectomy         The following portions of the patient's history were reviewed and updated as appropriate: Allergies, current medications, and problem list.    Review of Systems:   12 Pt  review of systems was negative except those addressed in the HPI,     History   Social History  . Marital Status: Single    Spouse Name: N/A    Number of Children: N/A  . Years of Education: N/A   Occupational History  . Not on file.   Social History Main Topics  . Smoking status: Former Smoker    Quit date: 10/30/1990  . Smokeless tobacco: Never Used  . Alcohol Use: No  . Drug Use: No  . Sexual Activity: Not on file   Other Topics Concern  . Not on file   Social History Narrative  . No narrative on file    Objective:  Filed Vitals:   11/08/12 0902  BP: 132/84  Pulse: 81  Temp: 97.7 F (36.5 C)  Resp: 14     General appearance: alert, cooperative and appears stated age Ears: normal TM's and external ear canals both ears Throat: lips, mucosa, and tongue normal; teeth and gums normal Neck: no adenopathy, no carotid bruit, supple, symmetrical, trachea midline and thyroid not enlarged, symmetric, no tenderness/mass/nodules Back: symmetric, no curvature. ROM normal. No CVA tenderness. Lungs: clear to auscultation bilaterally Heart: regular rate and rhythm, S1, S2 normal, no murmur, click, rub or gallop Abdomen: soft, non-tender; bowel sounds normal; no masses,  no organomegaly Pulses: 2+ and symmetric Skin: Skin color, texture, turgor normal. No rashes or lesions Lymph nodes: Cervical, supraclavicular, and axillary nodes normal. Foot exam:  Nails are well trimmed,  No callouses,   Sensation intact to microfilament  Assessment and Plan:  Muscle cramps at night Her cramps may be due to her spinal stenosis of female to be due to muscle fatigue and electrolytes disturbances. Checking muscle enzyme and electrolytes today. We discussed stopping her Crestor temporarily to see if the cramps resolve. If they do I have recommended that she resume the Crestor it at every other day dosing with daily use of coenzyme Q10. If the cramping does not change with stopping Crestor  Fecal incontinence alternating with constipation Discussed temporarily suspended from to see if her fecal incontinence and loose stools improved. She is scheduled to have a colonoscopy next week.  Diabetes mellitus type 2 in obese Well-controlled on current medications.  hemoglobin A1c has been consistently less than 7.0 . Se is up-to-date on eye exams and his foot exam is norma. Normal urine microalbumin to creatinine ratio\ She is on the appropriate medications  Hyperlipidemia LDL goal < 70 She has been taking Crestor for management of elevated LDL and triglycerides. She has been having some muscle cramps. I suggested she stop the Crestor for 3 months  Hypertension Well controlled on current regimen. Renal function stable, no changes today.   Updated Medication List Outpatient Encounter Prescriptions as of 11/08/2012  Medication Sig Dispense Refill  . acetaminophen (TYLENOL) 325 MG tablet Take 650 mg by mouth as needed.        Marland Kitchen ampicillin (PRINCIPEN) 500 MG capsule Take 4 capsules one hour prior to dental procedure  4 capsule  3  . aspirin 81 MG tablet Take 1 tablet (81 mg total) by mouth daily.  30 tablet  11  . Cholecalciferol (VITAMIN D3) 5000 UNITS CAPS Take 1 tablet by mouth daily.        . Coenzyme Q10 (COQ10) 100 MG CAPS Take 1 tablet by mouth daily.        . CRESTOR 10 MG tablet TAKE 1 TABLET DAILY  90 tablet  2  . diphenhydrAMINE (BENADRYL) 25 mg capsule Take 25 mg by mouth as needed.        .  fexofenadine (ALLEGRA) 180 MG tablet Take 180 mg by mouth daily.      Marland Kitchen glucose blood (FREESTYLE LITE) test strip Use to check blood sugars once daily,  Twice if needed   DM 250.00  102 each  3  . insulin glargine (LANTUS SOLOSTAR) 100 UNIT/ML injection       . LANTUS SOLOSTAR 100 UNIT/ML injection INJECT 55 UNITS UNDER THE SKIN DAILY  30 mL  2  . levothyroxine (SYNTHROID, LEVOTHROID) 75 MCG tablet TAKE 1 TABLET DAILY FOR HYPOTHYROIDISM  90 tablet  2  . lisinopril (PRINIVIL,ZESTRIL) 20 MG tablet TAKE ONE TABLET BY MOUTH EVERY DAY  90 tablet  1  . metFORMIN (GLUCOPHAGE-XR) 750 MG 24 hr tablet TAKE 1 TABLET THREE TIMES A DAY  270 tablet  1  . Misc Natural Products (CYSTEX) LIQD Take 15 mLs by mouth daily.      Marland Kitchen PARoxetine (PAXIL) 20 MG tablet TAKE 1 TABLET DAILY  90 tablet  1  . traMADol (ULTRAM) 50 MG tablet TAKE 1 TABLET FOUR TIMES A DAY AS NEEDED FOR KNEE PAIN  360 tablet  2  . meloxicam (MOBIC) 15 MG tablet TAKE ONE TABLET BY MOUTH EVERY DAY  90 tablet  3   No facility-administered encounter medications on file as of 11/08/2012.     Orders Placed This Encounter  Procedures  . MM Digital Screening  . Flu Vaccine QUAD 36+ mos PF IM (Fluarix)  . Lipid panel  . Hemoglobin A1c  . Microalbumin / creatinine urine ratio  . Comprehensive metabolic panel  . CK  . Magnesium  . HM DIABETES EYE EXAM  . HM DIABETES EYE EXAM    Return in about 3 months (around 02/07/2013).

## 2012-11-08 NOTE — Assessment & Plan Note (Signed)
Her cramps may be due to her spinal stenosis of female to be due to muscle fatigue and electrolytes disturbances. Checking muscle enzyme and electrolytes today. We discussed stopping her Crestor temporarily to see if the cramps resolve. If they do I have recommended that she resume the Crestor it at every other day dosing with daily use of coenzyme Q10. If the cramping does not change with stopping Crestor

## 2012-11-08 NOTE — Assessment & Plan Note (Signed)
She has been taking Crestor for management of elevated LDL and triglycerides. She has been having some muscle cramps. I suggested she stop the Crestor for 3 months

## 2012-11-10 ENCOUNTER — Encounter: Payer: Self-pay | Admitting: Internal Medicine

## 2012-11-15 DIAGNOSIS — K5730 Diverticulosis of large intestine without perforation or abscess without bleeding: Secondary | ICD-10-CM | POA: Diagnosis not present

## 2012-11-15 DIAGNOSIS — Z860100 Personal history of colon polyps, unspecified: Secondary | ICD-10-CM | POA: Diagnosis not present

## 2012-11-15 DIAGNOSIS — I10 Essential (primary) hypertension: Secondary | ICD-10-CM | POA: Diagnosis not present

## 2012-11-15 DIAGNOSIS — Z1211 Encounter for screening for malignant neoplasm of colon: Secondary | ICD-10-CM | POA: Diagnosis not present

## 2012-11-15 DIAGNOSIS — E785 Hyperlipidemia, unspecified: Secondary | ICD-10-CM | POA: Diagnosis not present

## 2012-11-15 DIAGNOSIS — E079 Disorder of thyroid, unspecified: Secondary | ICD-10-CM | POA: Diagnosis not present

## 2012-11-15 DIAGNOSIS — M810 Age-related osteoporosis without current pathological fracture: Secondary | ICD-10-CM | POA: Diagnosis not present

## 2012-11-15 DIAGNOSIS — D126 Benign neoplasm of colon, unspecified: Secondary | ICD-10-CM | POA: Diagnosis not present

## 2012-11-15 DIAGNOSIS — E119 Type 2 diabetes mellitus without complications: Secondary | ICD-10-CM | POA: Diagnosis not present

## 2012-11-15 DIAGNOSIS — K648 Other hemorrhoids: Secondary | ICD-10-CM | POA: Diagnosis not present

## 2012-11-15 DIAGNOSIS — M129 Arthropathy, unspecified: Secondary | ICD-10-CM | POA: Diagnosis not present

## 2012-11-15 DIAGNOSIS — Z96659 Presence of unspecified artificial knee joint: Secondary | ICD-10-CM | POA: Diagnosis not present

## 2012-11-15 DIAGNOSIS — Z79899 Other long term (current) drug therapy: Secondary | ICD-10-CM | POA: Diagnosis not present

## 2012-11-24 ENCOUNTER — Encounter: Payer: Self-pay | Admitting: Emergency Medicine

## 2012-12-24 LAB — HM COLONOSCOPY

## 2013-01-01 ENCOUNTER — Encounter: Payer: Self-pay | Admitting: Internal Medicine

## 2013-01-02 MED ORDER — PAROXETINE HCL 20 MG PO TABS: ORAL_TABLET | ORAL | Status: DC

## 2013-01-03 ENCOUNTER — Other Ambulatory Visit: Payer: Self-pay | Admitting: Internal Medicine

## 2013-02-07 ENCOUNTER — Ambulatory Visit: Payer: Medicare Other | Admitting: Internal Medicine

## 2013-02-17 ENCOUNTER — Other Ambulatory Visit: Payer: Self-pay | Admitting: Internal Medicine

## 2013-02-23 ENCOUNTER — Encounter: Payer: Self-pay | Admitting: Internal Medicine

## 2013-02-23 ENCOUNTER — Ambulatory Visit (INDEPENDENT_AMBULATORY_CARE_PROVIDER_SITE_OTHER): Payer: Medicare Other | Admitting: Internal Medicine

## 2013-02-23 VITALS — BP 138/80 | HR 75 | Temp 97.7°F | Resp 16 | Wt 281.5 lb

## 2013-02-23 DIAGNOSIS — Z23 Encounter for immunization: Secondary | ICD-10-CM

## 2013-02-23 DIAGNOSIS — I10 Essential (primary) hypertension: Secondary | ICD-10-CM

## 2013-02-23 DIAGNOSIS — E785 Hyperlipidemia, unspecified: Secondary | ICD-10-CM | POA: Diagnosis not present

## 2013-02-23 DIAGNOSIS — E669 Obesity, unspecified: Secondary | ICD-10-CM | POA: Diagnosis not present

## 2013-02-23 DIAGNOSIS — E119 Type 2 diabetes mellitus without complications: Secondary | ICD-10-CM | POA: Diagnosis not present

## 2013-02-23 DIAGNOSIS — E1169 Type 2 diabetes mellitus with other specified complication: Secondary | ICD-10-CM

## 2013-02-23 DIAGNOSIS — Z96659 Presence of unspecified artificial knee joint: Secondary | ICD-10-CM

## 2013-02-23 LAB — COMPREHENSIVE METABOLIC PANEL
ALT: 44 U/L — ABNORMAL HIGH (ref 0–35)
AST: 36 U/L (ref 0–37)
Albumin: 4 g/dL (ref 3.5–5.2)
Alkaline Phosphatase: 69 U/L (ref 39–117)
BUN: 16 mg/dL (ref 6–23)
CO2: 27 meq/L (ref 19–32)
CREATININE: 0.8 mg/dL (ref 0.4–1.2)
Calcium: 9.5 mg/dL (ref 8.4–10.5)
Chloride: 104 mEq/L (ref 96–112)
GFR: 74.41 mL/min (ref >60.00)
GLUCOSE: 92 mg/dL (ref 70–99)
Potassium: 4.3 mEq/L (ref 3.5–5.1)
Sodium: 139 mEq/L (ref 135–145)
Total Bilirubin: 0.6 mg/dL (ref 0.3–1.2)
Total Protein: 7.2 g/dL (ref 6.0–8.3)

## 2013-02-23 LAB — MICROALBUMIN / CREATININE URINE RATIO
Creatinine,U: 105.4 mg/dL
Microalb Creat Ratio: 0.6 mg/g (ref 0.0–30.0)
Microalb, Ur: 0.6 mg/dL (ref 0.0–1.9)

## 2013-02-23 LAB — LDL CHOLESTEROL, DIRECT: LDL DIRECT: 197.8 mg/dL

## 2013-02-23 LAB — LIPID PANEL
CHOLESTEROL: 280 mg/dL — AB (ref 0–200)
HDL: 54.8 mg/dL (ref >39.00)
TRIGLYCERIDES: 238 mg/dL — AB (ref 0.0–149.0)
Total CHOL/HDL Ratio: 5
VLDL: 47.6 mg/dL — ABNORMAL HIGH (ref 0.0–40.0)

## 2013-02-23 LAB — HM DIABETES FOOT EXAM: HM Diabetic Foot Exam: NORMAL

## 2013-02-23 LAB — HEMOGLOBIN A1C: HEMOGLOBIN A1C: 5.8 % (ref 4.6–6.5)

## 2013-02-23 NOTE — Patient Instructions (Addendum)
You received the Prevnar vaccine today  I want you to get 12 lbs off by next visit. !!  This is  my version of a  "Low GI"  Diet:  It will still lower your blood sugars and allow you to lose 4 to 8  lbs  per month if you follow it carefully.  Your goal with exercise is a minimum of 30 minutes of aerobic exercise 5 days per week (Walking does not count once it becomes easy!)    All of the foods can be found at grocery stores and in bulk at [employer].  The Atkins protein bars and shakes are available in more varieties at [employer], [employer] and [place].     7 AM Breakfast:  Choose from the following:  Low carbohydrate Protein  Shakes (I recommend the [employer] "Carb Control" shakes  Or the low carb shakes by Atkins.    2.5 carbs   Arnold's "Sandwhich Thin"toasted  w/ peanut butter (no jelly: about 20 net carbs  "Bagel Thin" with cream cheese and salmon: about 20 carbs   a scrambled egg/bacon/cheese burrito made with [place]'s "carb balance" whole wheat tortilla  (about 10 net carbs )   Avoid cereal and bananas, oatmeal and cream of wheat and grits. They are loaded with carbohydrates!   10 AM: high protein snack  Protein bar by Atkins (the snack size, under 200 cal, usually < 6 net carbs).    A stick of cheese:  Around 1 carb,  100 cal     Dannon Light n Fit [place] Yogurt  (80 cal, 8 carbs)  Other so called "protein bars" and Greek yogurts tend to be loaded with carbohydrates.  Remember, in food advertising, the word "energy" is synonymous for " carbohydrate."  Lunch:   A Sandwich using the bread choices listed, Can use any  Eggs,  lunchmeat, grilled meat or canned tuna), avocado, regular mayo/mustard  and cheese.  A Salad using blue cheese, ranch,  Goddess or vinagrette,  No croutons or "confetti" and no "candied nuts" but regular nuts OK.   No pretzels or chips.  Pickles and miniature sweet peppers are a good low carb alternative that provide a "crunch"  The bread is the only source  of carbohydrate in a sandwich and  can be decreased by trying some of these alternatives to traditional loaf bread  Joseph's makes a pita bread and a flat bread that are 50 cal and 4 net carbs available at [place] and [place].  This can be toasted to use with hummous as well  Toufayan makes a low carb flatbread that's 100 cal and 9 net carbs available at [employer] and [employer] makes 2 sizes of  Low carb whole wheat tortilla  (The large one is 210 cal and 6 net carbs) Avoid "Low fat dressings, as well as Barry Brunner and [place] dressings They are loaded with sugar!   3 PM/ Mid day  Snack:  Consider  1 ounce of  almonds, walnuts, pistachios, pecans, peanuts,  Macadamia nuts or a nut medley.  Avoid "granola"; the dried cranberries and raisins are loaded with carbohydrates. Mixed nuts as long as there are no raisins,  cranberries or dried fruit.     6 PM  Dinner:     Meat/fowl/fish with a green salad, and either broccoli, cauliflower, green beans, spinach, brussel sprouts or  Lima beans. DO NOT BREAD THE PROTEIN!!      There is a low carb  pasta by Dreamfield's that is acceptable and tastes great: only 5 digestible carbs/serving.( All grocery stores but [employer]Js carry it )  Try Hurley Cisco Angelo's chicken piccata or chicken or eggplant parm over low carb pasta.(Lowes and [employer]Js)   Marjory Lies Sanchez's "Carnitas" (pulled pork, no sauce,  0 carbs) or his beef pot roast to make a dinner burrito (at [employer]'s)  Pesto over low carb pasta (bj's sells a good quality pesto in the center refrigerated section of the deli   Whole wheat pasta is still full of digestible carbs and  Not as low in glycemic index as Dreamfield's.   Brown rice is still rice,  So skip the rice and noodles if you eat [place] or [place] (or at least limit to 1/2 cup)  9 PM snack :   Breyer's "low carb" fudgsicle or  ice cream bar (Carb Smart line), or  Weight Watcher's ice cream bar , or another "no sugar added" ice cream;  a serving of fresh  berries/cherries with whipped cream   Cheese or DANNON'S LlGHT N FIT G[place] YOGURT  Avoid bananas, pineapple, grapes  and watermelon on a regular basis because they are high in sugar.  THINK OF THEM AS DESSERT  Remember that snack Substitutions should be less than 10 NET carbs per serving and meals < 20 carbs. Remember to subtract fiber grams to get the "net carbs."

## 2013-02-23 NOTE — Progress Notes (Signed)
Patient ID: Brenda Floyd, female   DOB: 07-Jul-1940, 73 y.o.   MRN: [id number]   . Patient Active Problem List   Diagnosis Date Noted  . Muscle cramps at night 11/08/2012  . Fecal incontinence alternating with constipation 11/08/2012  . Degenerative disk disease 02/01/2011  . Hyperlipidemia LDL goal < 70 11/01/2010  . Diabetes mellitus type 2 in obese   . Hypertension   . Obesity (BMI 30-39.9)   . Diverticulosis of sigmoid colon     Subjective:  CC:   Chief Complaint  Patient presents with  . Follow-up    3 month    HPI:   Brenda Floyd a 73 y.o. female who presents for 3 month follow up on DM., obesity and hyperlipidemia..  She has gained weight over the last 3 months due to lapse in dietary control and reduced physical activity.  She has no new issues. She has adjusted her insulin as needed to maintain normoglycemia.   Ha snotmissed any doses and maintains regular followup with ophthalmologist.   Past Medical History  Diagnosis Date  . Colon polyps 2012    [facility], followup due 2015  . Diverticulosis of sigmoid colon July 2012    by colonoscopy  . Fracture closed, femur, shaft remote    s/p screws and rod repair  . Diabetes mellitus   . Hypertension   . Thyroid disease     Past Surgical History  Procedure Laterality Date  . Joint replacement      bilateral knee, 2011  . Abdominal hysterectomy         The following portions of the patient's history were reviewed and updated as appropriate: Allergies, current medications, and problem list.    Review of Systems:   12 Pt  review of systems was negative except those addressed in the HPI,     History   Social History  . Marital Status: Single    Spouse Name: N/A    Number of Children: N/A  . Years of Education: N/A   Occupational History  . Not on file.   Social History Main Topics  . Smoking status: Former Smoker    Quit date: 10/30/1990  . Smokeless tobacco: Never Used  . Alcohol Use:  No  . Drug Use: No  . Sexual Activity: Not on file   Other Topics Concern  . Not on file   Social History Narrative  . No narrative on file    Objective:  Filed Vitals:   02/23/13 0858  BP: 138/80  Pulse: 75  Temp: 97.7 F (36.5 C)  Resp: 16     General appearance: alert, cooperative and appears stated age Ears: normal TM's and external ear canals both ears Throat: lips, mucosa, and tongue normal; teeth and gums normal Neck: no adenopathy, no carotid bruit, supple, symmetrical, trachea midline and thyroid not enlarged, symmetric, no tenderness/mass/nodules Back: symmetric, no curvature. ROM normal. No CVA tenderness. Lungs: clear to auscultation bilaterally Heart: regular rate and rhythm, S1, S2 normal, no murmur, click, rub or gallop Abdomen: soft, non-tender; bowel sounds normal; no masses,  no organomegaly Pulses: 2+ and symmetric Skin: Skin color, texture, turgor normal. No rashes or lesions Lymph nodes: Cervical, supraclavicular, and axillary nodes normal.  Assessment and Plan:  Diabetes mellitus type 2 in obese Has reduced  lantus to 35 units .Well-controlled on current medications.  hemoglobin A1c has been consistently less than 6.0 .She is up-to-date on eye exams and her foot exam is normal. She has no proteinuria.  He iss on the appropriate medications.  Lab Results  Component Value Date   HGBA1C 5.8 02/23/2013   Lab Results  Component Value Date   MICROALBUR 0.6 02/23/2013     Hypertension Well controlled on current regimen. Renal function stable, no changes today.  Lab Results  Component Value Date   CREATININE 0.8 02/23/2013     Obesity (BMI 30-39.9) I have addressed  BMI and recommended wt loss of 10% of body weigh over the next 6 months using a low glycemic index diet and regular exercise a minimum of 5 days per week.     Updated Medication List Outpatient Encounter Prescriptions as of 02/23/2013  Medication Sig  . acetaminophen (TYLENOL)  325 MG tablet Take 650 mg by mouth as needed.    Marland Kitchen aspirin 81 MG tablet Take 1 tablet (81 mg total) by mouth daily.  . Cholecalciferol (VITAMIN D3) 5000 UNITS CAPS Take 1 tablet by mouth daily.    . Coenzyme Q10 (COQ10) 100 MG CAPS Take 1 tablet by mouth daily.    . diphenhydrAMINE (BENADRYL) 25 mg capsule Take 25 mg by mouth as needed.    . fexofenadine (ALLEGRA) 180 MG tablet Take 180 mg by mouth daily.  Marland Kitchen glucose blood (FREESTYLE LITE) test strip Use to check blood sugars once daily,  Twice if needed   DM 250.00  . LANTUS SOLOSTAR 100 UNIT/ML injection INJECT 55 UNITS UNDER THE SKIN DAILY  . levothyroxine (SYNTHROID, LEVOTHROID) 75 MCG tablet TAKE 1 TABLET DAILY FOR HYPOTHYROIDISM  . lisinopril (PRINIVIL,ZESTRIL) 20 MG tablet TAKE ONE TABLET BY MOUTH ONCE DAILY  . meloxicam (MOBIC) 15 MG tablet TAKE ONE TABLET BY MOUTH EVERY DAY  . metFORMIN (GLUCOPHAGE-XR) 750 MG 24 hr tablet TAKE 1 TABLET THREE TIMES A DAY  . Misc Natural Products (CYSTEX) LIQD Take 15 mLs by mouth daily.  Marland Kitchen PARoxetine (PAXIL) 20 MG tablet TAKE 1 TABLET DAILY  . traMADol (ULTRAM) 50 MG tablet TAKE 1 TABLET FOUR TIMES A DAY AS NEEDED FOR KNEE PAIN  . [DISCONTINUED] insulin glargine (LANTUS SOLOSTAR) 100 UNIT/ML injection   . amoxicillin (AMOXIL) 500 MG capsule Take 2,000 mg by mouth once. Take 2000 mg one hour before dental procedures.  . CRESTOR 10 MG tablet TAKE 1 TABLET DAILY  . [DISCONTINUED] ampicillin (PRINCIPEN) 500 MG capsule Take 4 capsules one hour prior to dental procedure     Orders Placed This Encounter  Procedures  . Pneumococcal conjugate vaccine 13-valent  . Lipid panel  . Hemoglobin A1c  . Microalbumin / creatinine urine ratio  . Comprehensive metabolic panel  . LDL cholesterol, direct  . HM COLONOSCOPY    No Follow-up on file.

## 2013-02-23 NOTE — Assessment & Plan Note (Addendum)
Has reduced  lantus to 35 units .Well-controlled on current medications.  hemoglobin A1c has been consistently less than 6.0 .She is up-to-date on eye exams and her foot exam is normal. She has no proteinuria.  He iss on the appropriate medications.  Lab Results  Component Value Date   HGBA1C 5.8 02/23/2013   Lab Results  Component Value Date   MICROALBUR 0.6 02/23/2013

## 2013-02-24 ENCOUNTER — Other Ambulatory Visit: Payer: Self-pay | Admitting: Internal Medicine

## 2013-02-24 ENCOUNTER — Telehealth: Payer: Self-pay

## 2013-02-24 NOTE — Telephone Encounter (Signed)
Ok refill? 

## 2013-02-24 NOTE — Telephone Encounter (Signed)
Relevant patient education assigned to patient using Emmi. ° °

## 2013-02-25 ENCOUNTER — Encounter: Payer: Self-pay | Admitting: Internal Medicine

## 2013-02-25 DIAGNOSIS — Z96659 Presence of unspecified artificial knee joint: Secondary | ICD-10-CM | POA: Insufficient documentation

## 2013-02-25 NOTE — Assessment & Plan Note (Signed)
Well controlled on current regimen. Renal function stable, no changes today.  Lab Results  Component Value Date   CREATININE 0.8 02/23/2013

## 2013-02-25 NOTE — Assessment & Plan Note (Addendum)
l on amocillin for prophylaxis of joint infection during dental procedures

## 2013-02-25 NOTE — Assessment & Plan Note (Signed)
I have addressed  BMI and recommended wt loss of 10% of body weigh over the next 6 months using a low glycemic index diet and regular exercise a minimum of 5 days per week.   

## 2013-02-25 NOTE — Telephone Encounter (Signed)
Ok to refill,  Refill sent  

## 2013-02-26 ENCOUNTER — Encounter: Payer: Self-pay | Admitting: Internal Medicine

## 2013-02-26 ENCOUNTER — Other Ambulatory Visit: Payer: Self-pay | Admitting: Internal Medicine

## 2013-02-26 DIAGNOSIS — Z789 Other specified health status: Secondary | ICD-10-CM | POA: Insufficient documentation

## 2013-03-03 ENCOUNTER — Telehealth: Payer: Self-pay | Admitting: *Deleted

## 2013-03-03 NOTE — Telephone Encounter (Signed)
Called [facility] for prior authorization on the Metformin HCL Er, form is being faxed over

## 2013-03-03 NOTE — Telephone Encounter (Signed)
Received PA request form gave to [facility]

## 2013-03-22 ENCOUNTER — Other Ambulatory Visit: Payer: Self-pay | Admitting: Internal Medicine

## 2013-03-30 ENCOUNTER — Other Ambulatory Visit: Payer: Self-pay | Admitting: Internal Medicine

## 2013-03-30 NOTE — Telephone Encounter (Signed)
Last TSH 04/29/11

## 2013-03-30 NOTE — Telephone Encounter (Signed)
Ok to refill,  Refill sent  

## 2013-04-18 ENCOUNTER — Other Ambulatory Visit: Payer: Self-pay | Admitting: Internal Medicine

## 2013-05-18 ENCOUNTER — Other Ambulatory Visit: Payer: Self-pay | Admitting: Internal Medicine

## 2013-05-25 ENCOUNTER — Other Ambulatory Visit: Payer: Self-pay | Admitting: *Deleted

## 2013-05-25 MED ORDER — AMOXICILLIN 500 MG PO CAPS: 2000.0000 mg | ORAL_CAPSULE | Freq: Once | ORAL | Status: DC

## 2013-05-25 NOTE — Telephone Encounter (Signed)
Electronic Rx request, for antibiotic (pt uses before dental procedures)  Please advise

## 2013-05-26 ENCOUNTER — Encounter: Payer: Self-pay | Admitting: Internal Medicine

## 2013-05-26 ENCOUNTER — Ambulatory Visit (INDEPENDENT_AMBULATORY_CARE_PROVIDER_SITE_OTHER): Payer: Medicare Other | Admitting: Internal Medicine

## 2013-05-26 ENCOUNTER — Telehealth: Payer: Self-pay | Admitting: Internal Medicine

## 2013-05-26 VITALS — BP 130/78 | HR 96 | Temp 98.2°F | Resp 18 | Wt 273.5 lb

## 2013-05-26 DIAGNOSIS — E559 Vitamin D deficiency, unspecified: Secondary | ICD-10-CM

## 2013-05-26 DIAGNOSIS — E039 Hypothyroidism, unspecified: Secondary | ICD-10-CM | POA: Diagnosis not present

## 2013-05-26 DIAGNOSIS — E785 Hyperlipidemia, unspecified: Secondary | ICD-10-CM

## 2013-05-26 DIAGNOSIS — E669 Obesity, unspecified: Secondary | ICD-10-CM

## 2013-05-26 DIAGNOSIS — E119 Type 2 diabetes mellitus without complications: Secondary | ICD-10-CM | POA: Diagnosis not present

## 2013-05-26 DIAGNOSIS — Z789 Other specified health status: Secondary | ICD-10-CM

## 2013-05-26 DIAGNOSIS — Z1239 Encounter for other screening for malignant neoplasm of breast: Secondary | ICD-10-CM

## 2013-05-26 DIAGNOSIS — Z888 Allergy status to other drugs, medicaments and biological substances status: Secondary | ICD-10-CM

## 2013-05-26 DIAGNOSIS — E1169 Type 2 diabetes mellitus with other specified complication: Secondary | ICD-10-CM

## 2013-05-26 DIAGNOSIS — I10 Essential (primary) hypertension: Secondary | ICD-10-CM

## 2013-05-26 LAB — LIPID PANEL
Cholesterol: 274 mg/dL — ABNORMAL HIGH (ref 0–200)
HDL: 52 mg/dL (ref >39)
LDL CALC: 167 mg/dL — AB (ref 0–99)
TRIGLYCERIDES: 276 mg/dL — AB (ref <150)
Total CHOL/HDL Ratio: 5.3 Ratio
VLDL: 55 mg/dL — ABNORMAL HIGH (ref 0–40)

## 2013-05-26 LAB — COMPREHENSIVE METABOLIC PANEL WITH GFR
ALT: 30 U/L (ref 0–35)
AST: 24 U/L (ref 0–37)
Albumin: 4.4 g/dL (ref 3.5–5.2)
Alkaline Phosphatase: 62 U/L (ref 39–117)
BUN: 18 mg/dL (ref 6–23)
CO2: 25 meq/L (ref 19–32)
Calcium: 9.3 mg/dL (ref 8.4–10.5)
Chloride: 103 meq/L (ref 96–112)
Creat: 0.76 mg/dL (ref 0.50–1.10)
Glucose, Bld: 89 mg/dL (ref 70–99)
Potassium: 4.4 meq/L (ref 3.5–5.3)
Sodium: 139 meq/L (ref 135–145)
Total Bilirubin: 0.5 mg/dL (ref 0.2–1.2)
Total Protein: 6.8 g/dL (ref 6.0–8.3)

## 2013-05-26 LAB — HM DIABETES FOOT EXAM: HM Diabetic Foot Exam: NORMAL

## 2013-05-26 MED ORDER — MELOXICAM 15 MG PO TABS: ORAL_TABLET | ORAL | Status: DC

## 2013-05-26 NOTE — Telephone Encounter (Signed)
At time of checkout, instructions state wellness visit in 3 months.  Pt states she does not believe in wellness visits/physicals and states she only wants routine follow up.  Scheduled 3 month appt as office visit to allow 30 minutes.

## 2013-05-26 NOTE — Telephone Encounter (Signed)
FYI

## 2013-05-26 NOTE — Progress Notes (Signed)
Pre-visit discussion using our clinic review tool. No additional management support is needed unless otherwise documented below in the visit note.  

## 2013-05-26 NOTE — Progress Notes (Signed)
Patient ID: Brenda Floyd, female   DOB: Jan 24, 1941, 73 y.o.   MRN: [id number]  Patient Active Problem List   Diagnosis Date Noted  . Statin intolerance 02/26/2013  . S/P TKR (total knee replacement) 02/25/2013  . Muscle cramps at night 11/08/2012  . Fecal incontinence alternating with constipation 11/08/2012  . Degenerative disk disease 02/01/2011  . Hyperlipidemia LDL goal < 70 11/01/2010  . Diabetes mellitus type 2 in obese   . Hypertension   . Obesity (BMI 30-39.9)   . Diverticulosis of sigmoid colon     Subjective:  CC:   Chief Complaint  Patient presents with  . Follow-up    3 month    HPI:   Brenda Floyd is a 73 y.o. female who presents for Follow up on DM Type 2, hyperlipidemia and morbid obesity.    New cc; right shoulder pain with recurrent trapezius spasm .  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    26 units of Lantus  Sugars have  Been well controlled.   Tooth root fractured, had it pulled  And found an abscessed root with submental LAD now  resolved.   Occasional insomnia,  Some snoring noted but no apnea reported  Has lost 8 lbs     Past Medical History  Diagnosis Date  . Colon polyps 2012    [facility], followup due 2015  . Diverticulosis of sigmoid colon July 2012    by colonoscopy  . Fracture closed, femur, shaft remote    s/p screws and rod repair  . Diabetes mellitus   . Hypertension   . Thyroid disease     Past Surgical History  Procedure Laterality Date  . Joint replacement      bilateral knee, 2011  . Abdominal hysterectomy         The following portions of the patient's history were reviewed and updated as appropriate: Allergies, current medications, and problem list.    Review of Systems:   Patient denies headache, fevers, malaise, unintentional weight loss, skin rash, eye pain, sinus congestion and sinus pain, sore throat, dysphagia,  hemoptysis , cough, dyspnea,  wheezing, chest pain, palpitations, orthopnea, edema, abdominal pain, nausea, melena, diarrhea, constipation, flank pain, dysuria, hematuria, urinary  Frequency, nocturia, numbness, tingling, seizures,  Focal weakness, Loss of consciousness,  Tremor, insomnia, depression, anxiety, and suicidal ideation.     History   Social History  . Marital Status: Single    Spouse Name: N/A    Number of Children: N/A  . Years of Education: N/A   Occupational History  . Not on file.   Social History Main Topics  . Smoking status: Former Smoker    Quit date: 10/30/1990  . Smokeless tobacco: Never Used  . Alcohol Use: No  . Drug Use: No  . Sexual Activity: Not on file   Other Topics Concern  . Not on file   Social History Narrative  . No narrative on file    Objective:  Filed Vitals:   05/26/13 1509  BP: 130/78  Pulse: 96  Temp: 98.2 F (36.8 C)  Resp: 18     General appearance: alert, cooperative and appears stated age Ears: normal TM's and external ear canals both ears Throat: lips, mucosa, and tongue normal; teeth and gums normal Neck: no adenopathy, no carotid bruit, supple, symmetrical, trachea midline and thyroid not enlarged, symmetric, no tenderness/mass/nodules Back: symmetric, no curvature. ROM normal. No CVA tenderness. Lungs: clear  to auscultation bilaterally Heart: regular rate and rhythm, S1, S2 normal, no murmur, click, rub or gallop Abdomen: soft, non-tender; bowel sounds normal; no masses,  no organomegaly Pulses: 2+ and symmetric Skin: Skin color, texture, turgor normal. No rashes or lesions Lymph nodes: Cervical, supraclavicular, and axillary nodes normal.  Assessment and Plan:  Diabetes mellitus type 2 in obese Has reduced  lantus to 26 units .Well-controlled on current medications.  hemoglobin A1c has been consistently less than 6.0 .She is up-to-date on eye exams and her foot exam is normal. She has no proteinuria.  He iss on the appropriate  medications.  Lab Results  Component Value Date   HGBA1C 5.8* 05/26/2013   Lab Results  Component Value Date   MICROALBUR 0.50 05/26/2013       Hyperlipidemia LDL goal < 70 She developed muscle cramps on Crestor so I suggested she stop the Crestor for 3 months.  LD is 167 without statin.  Will suggest trial of Tricor.   Lab Results  Component Value Date   CHOL 274* 05/26/2013   HDL 52 05/26/2013   LDLCALC 167* 05/26/2013   LDLDIRECT 197.8 02/23/2013   TRIG 276* 05/26/2013   CHOLHDL 5.3 05/26/2013      Hypertension Well controlled on current regimen. Renal function stable, no changes today.  Lab Results  Component Value Date   CREATININE 0.76 05/26/2013   Lab Results  Component Value Date   NA 139 05/26/2013   K 4.4 05/26/2013   CL 103 05/26/2013   CO2 25 05/26/2013      Obesity (BMI 30-39.9) I have congratulated her in reduction of   BMI and encouraged  Continued weight loss with goal of 10% of body weigh over the next 6 months using a low glycemic index diet and regular exercise a minimum of 5 days per week.    Statin intolerance She developed myalgia with crestor.  Will consider trial of Tricor.    Updated Medication List Outpatient Encounter Prescriptions as of 05/26/2013  Medication Sig  . acetaminophen (TYLENOL) 325 MG tablet Take 650 mg by mouth as needed.    Marland Kitchen amoxicillin (AMOXIL) 500 MG capsule TAKE FOUR CAPSULES BY MOUTH ONE HOUR BEFORE APPOINTMENT  . amoxicillin (AMOXIL) 500 MG capsule Take 4 capsules (2,000 mg total) by mouth once. Take 2000 mg one hour before dental procedures.  Marland Kitchen aspirin 81 MG tablet Take 1 tablet (81 mg total) by mouth daily.  . Cholecalciferol (VITAMIN D3) 5000 UNITS CAPS Take 1 tablet by mouth daily.    . Coenzyme Q10 (COQ10) 100 MG CAPS Take 1 tablet by mouth daily.    . diphenhydrAMINE (BENADRYL) 25 mg capsule Take 25 mg by mouth as needed.    Marland Kitchen glucose blood (FREESTYLE LITE) test strip Use to check blood sugars once daily,  Twice  if needed   DM 250.00  . Insulin Glargine (LANTUS SOLOSTAR) 100 UNIT/ML Solostar Pen INJECT 26 UNITS UNDER THE SKIN DAILY  . levothyroxine (SYNTHROID, LEVOTHROID) 75 MCG tablet TAKE 1 TABLET DAILY FOR HYPOTHYROIDISM  . lisinopril (PRINIVIL,ZESTRIL) 20 MG tablet TAKE ONE TABLET BY MOUTH ONCE DAILY  . loratadine (CLARITIN) 10 MG tablet Take 10 mg by mouth daily.  . meloxicam (MOBIC) 15 MG tablet TAKE ONE TABLET BY MOUTH EVERY DAY  . metFORMIN (GLUCOPHAGE-XR) 750 MG 24 hr tablet TAKE 1 TABLET THREE TIMES A DAY  . Misc Natural Products (CYSTEX) LIQD Take 15 mLs by mouth daily.  Marland Kitchen PARoxetine (PAXIL) 20 MG tablet TAKE 1  TABLET DAILY  . Red Yeast Rice 600 MG CAPS Take 1 capsule by mouth 2 (two) times daily.  . traMADol (ULTRAM) 50 MG tablet TAKE 1 TABLET FOUR TIMES A DAY AS NEEDED FOR KNEE PAIN  . [DISCONTINUED] LANTUS SOLOSTAR 100 UNIT/ML Solostar Pen INJECT 55 UNITS UNDER THE SKIN DAILY  . [DISCONTINUED] meloxicam (MOBIC) 15 MG tablet TAKE ONE TABLET BY MOUTH EVERY DAY  . [DISCONTINUED] fexofenadine (ALLEGRA) 180 MG tablet Take 180 mg by mouth daily.  . [DISCONTINUED] PARoxetine (PAXIL) 20 MG tablet TAKE 1 TABLET DAILY     Orders Placed This Encounter  Procedures  . MM DIGITAL SCREENING BILATERAL  . Lipid panel  . Hemoglobin A1c  . Microalbumin / creatinine urine ratio  . Comprehensive metabolic panel  . TSH  . Vit D  25 hydroxy (rtn osteoporosis monitoring)  . HM DIABETES FOOT EXAM    No Follow-up on file.

## 2013-05-26 NOTE — Patient Instructions (Signed)
Congratulations on the weight loss!  Consider trying turmeric  For you joint pain  We'll call you with the results  Diabetes and Exercise Exercising regularly is important. It is not just about losing weight. It has many health benefits, such as:  Improving your overall fitness, flexibility, and endurance.  Increasing your bone density.  Helping with weight control.  Decreasing your body fat.  Increasing your muscle strength.  Reducing stress and tension.  Improving your overall health. People with diabetes who exercise gain additional benefits because exercise:  Reduces appetite.  Improves the body's use of blood sugar (glucose).  Helps lower or control blood glucose.  Decreases blood pressure.  Helps control blood lipids (such as cholesterol and triglycerides).  Improves the body's use of the hormone insulin by:  Increasing the body's insulin sensitivity.  Reducing the body's insulin needs.  Decreases the risk for heart disease because exercising:  Lowers cholesterol and triglycerides levels.  Increases the levels of good cholesterol (such as high-density lipoproteins [HDL]) in the body.  Lowers blood glucose levels. YOUR ACTIVITY PLAN  Choose an activity that you enjoy and set realistic goals. Your health care provider or diabetes educator can help you make an activity plan that works for you. You can break activities into 2 or 3 sessions throughout the day. Doing so is as good as one long session. Exercise ideas include:  Taking the dog for a walk.  Taking the stairs instead of the elevator.  Dancing to your favorite song.  Doing your favorite exercise with a friend. RECOMMENDATIONS FOR EXERCISING WITH TYPE 1 OR TYPE 2 DIABETES   Check your blood glucose before exercising. If blood glucose levels are greater than 240 mg/dL, check for urine ketones. Do not exercise if ketones are present.  Avoid injecting insulin into areas of the body that are going to  be exercised. For example, avoid injecting insulin into:  The arms when playing tennis.  The legs when jogging.  Keep a record of:  Food intake before and after you exercise.  Expected peak times of insulin action.  Blood glucose levels before and after you exercise.  The type and amount of exercise you have done.  Review your records with your health care provider. Your health care provider will help you to develop guidelines for adjusting food intake and insulin amounts before and after exercising.  If you take insulin or oral hypoglycemic agents, watch for signs and symptoms of hypoglycemia. They include:  Dizziness.  Shaking.  Sweating.  Chills.  Confusion.  Drink plenty of water while you exercise to prevent dehydration or heat stroke. Body water is lost during exercise and must be replaced.  Talk to your health care provider before starting an exercise program to make sure it is safe for you. Remember, almost any type of activity is better than none. Document Released: 04/18/2003 Document Revised: 09/28/2012 Document Reviewed: 07/05/2012 [facility] Patient Information 2014 [place].

## 2013-05-27 LAB — TSH: TSH: 1.606 u[IU]/mL (ref 0.350–4.500)

## 2013-05-27 LAB — VITAMIN D 25 HYDROXY (VIT D DEFICIENCY, FRACTURES): VIT D 25 HYDROXY: 49 ng/mL (ref 30–89)

## 2013-05-27 LAB — MICROALBUMIN / CREATININE URINE RATIO
Creatinine, Urine: 81.2 mg/dL
Microalb Creat Ratio: 6.2 mg/g (ref 0.0–30.0)
Microalb, Ur: 0.5 mg/dL (ref 0.00–1.89)

## 2013-05-27 LAB — HEMOGLOBIN A1C
HEMOGLOBIN A1C: 5.8 % — AB (ref <5.7)
Mean Plasma Glucose: 120 mg/dL — ABNORMAL HIGH (ref <117)

## 2013-05-28 ENCOUNTER — Encounter: Payer: Self-pay | Admitting: Internal Medicine

## 2013-05-28 NOTE — Assessment & Plan Note (Signed)
She developed myalgia with crestor.  Will consider trial of Tricor.

## 2013-05-28 NOTE — Assessment & Plan Note (Addendum)
Has reduced  lantus to 26 units .Well-controlled on current medications.  hemoglobin A1c has been consistently less than 6.0 .She is up-to-date on eye exams and her foot exam is normal. She has no proteinuria.  He iss on the appropriate medications.  Lab Results  Component Value Date   HGBA1C 5.8* 05/26/2013   Lab Results  Component Value Date   MICROALBUR 0.50 05/26/2013

## 2013-05-28 NOTE — Assessment & Plan Note (Addendum)
She developed muscle cramps on Crestor so I suggested she stop the Crestor for 3 months.  LD is 167 without statin.  Will suggest trial of Tricor.   Lab Results  Component Value Date   CHOL 274* 05/26/2013   HDL 52 05/26/2013   LDLCALC 167* 05/26/2013   LDLDIRECT 197.8 02/23/2013   TRIG 276* 05/26/2013   CHOLHDL 5.3 05/26/2013

## 2013-05-28 NOTE — Assessment & Plan Note (Signed)
I have congratulated her in reduction of   BMI and encouraged  Continued weight loss with goal of 10% of body weigh over the next 6 months using a low glycemic index diet and regular exercise a minimum of 5 days per week.    

## 2013-05-28 NOTE — Assessment & Plan Note (Signed)
Well controlled on current regimen. Renal function stable, no changes today.  Lab Results  Component Value Date   CREATININE 0.76 05/26/2013   Lab Results  Component Value Date   NA 139 05/26/2013   K 4.4 05/26/2013   CL 103 05/26/2013   CO2 25 05/26/2013

## 2013-05-29 ENCOUNTER — Other Ambulatory Visit: Payer: Self-pay | Admitting: Internal Medicine

## 2013-05-29 MED ORDER — COENZYME Q10 30 MG PO CAPS: 30.0000 mg | ORAL_CAPSULE | Freq: Every day | ORAL | Status: DC

## 2013-05-29 MED ORDER — ATORVASTATIN CALCIUM 10 MG PO TABS: 10.0000 mg | ORAL_TABLET | Freq: Every day | ORAL | Status: DC

## 2013-05-30 ENCOUNTER — Encounter: Payer: Self-pay | Admitting: Emergency Medicine

## 2013-06-07 ENCOUNTER — Ambulatory Visit: Payer: Self-pay | Admitting: Internal Medicine

## 2013-06-07 DIAGNOSIS — Z1231 Encounter for screening mammogram for malignant neoplasm of breast: Secondary | ICD-10-CM | POA: Diagnosis not present

## 2013-06-08 ENCOUNTER — Other Ambulatory Visit: Payer: Self-pay | Admitting: Internal Medicine

## 2013-07-05 ENCOUNTER — Encounter: Payer: Self-pay | Admitting: Internal Medicine

## 2013-07-05 NOTE — Telephone Encounter (Signed)
Pt sent mychart message, asking for Tramadol refill. Last visit 05/26/13, ok refill?

## 2013-07-06 MED ORDER — TRAMADOL HCL 50 MG PO TABS: ORAL_TABLET | ORAL | Status: DC

## 2013-07-06 NOTE — Telephone Encounter (Signed)
Ok to refill,  printed rx  

## 2013-07-12 ENCOUNTER — Encounter: Payer: Self-pay | Admitting: Internal Medicine

## 2013-07-16 ENCOUNTER — Other Ambulatory Visit: Payer: Self-pay | Admitting: Internal Medicine

## 2013-08-15 ENCOUNTER — Encounter: Payer: Self-pay | Admitting: Internal Medicine

## 2013-08-22 DIAGNOSIS — M999 Biomechanical lesion, unspecified: Secondary | ICD-10-CM | POA: Diagnosis not present

## 2013-08-22 DIAGNOSIS — M47817 Spondylosis without myelopathy or radiculopathy, lumbosacral region: Secondary | ICD-10-CM | POA: Diagnosis not present

## 2013-08-22 DIAGNOSIS — M5380 Other specified dorsopathies, site unspecified: Secondary | ICD-10-CM | POA: Diagnosis not present

## 2013-09-01 ENCOUNTER — Ambulatory Visit: Payer: Medicare Other | Admitting: Internal Medicine

## 2013-09-06 ENCOUNTER — Other Ambulatory Visit: Payer: Self-pay | Admitting: Internal Medicine

## 2013-09-15 ENCOUNTER — Ambulatory Visit: Payer: Medicare Other | Admitting: Internal Medicine

## 2013-09-22 ENCOUNTER — Other Ambulatory Visit: Payer: Self-pay | Admitting: Internal Medicine

## 2013-09-26 ENCOUNTER — Other Ambulatory Visit: Payer: Self-pay | Admitting: Internal Medicine

## 2013-09-27 LAB — HM DIABETES EYE EXAM

## 2013-10-18 ENCOUNTER — Ambulatory Visit (INDEPENDENT_AMBULATORY_CARE_PROVIDER_SITE_OTHER): Payer: Medicare Other | Admitting: Internal Medicine

## 2013-10-18 ENCOUNTER — Encounter: Payer: Self-pay | Admitting: Internal Medicine

## 2013-10-18 VITALS — BP 136/82 | HR 94 | Temp 97.8°F | Resp 14 | Ht 65.0 in | Wt 255.5 lb

## 2013-10-18 DIAGNOSIS — E1169 Type 2 diabetes mellitus with other specified complication: Secondary | ICD-10-CM

## 2013-10-18 DIAGNOSIS — E669 Obesity, unspecified: Secondary | ICD-10-CM

## 2013-10-18 DIAGNOSIS — Z79899 Other long term (current) drug therapy: Secondary | ICD-10-CM

## 2013-10-18 DIAGNOSIS — Z13820 Encounter for screening for osteoporosis: Secondary | ICD-10-CM

## 2013-10-18 DIAGNOSIS — E785 Hyperlipidemia, unspecified: Secondary | ICD-10-CM

## 2013-10-18 DIAGNOSIS — E119 Type 2 diabetes mellitus without complications: Secondary | ICD-10-CM | POA: Diagnosis not present

## 2013-10-18 DIAGNOSIS — M533 Sacrococcygeal disorders, not elsewhere classified: Secondary | ICD-10-CM

## 2013-10-18 LAB — HM DIABETES FOOT EXAM: HM DIABETIC FOOT EXAM: NORMAL

## 2013-10-18 NOTE — Progress Notes (Signed)
Pre visit review using our clinic review tool, if applicable. No additional management support is needed unless otherwise documented below in the visit note. 

## 2013-10-18 NOTE — Progress Notes (Signed)
Patient ID: Brenda Floyd, female   DOB: September 19, 1940, 73 y.o.   MRN: [id number]   Patient Active Problem List   Diagnosis Date Noted  . Statin intolerance 02/26/2013  . S/P TKR (total knee replacement) 02/25/2013  . Fecal incontinence alternating with constipation 11/08/2012  . Sacroiliac joint disease 02/01/2011  . Hyperlipidemia with target LDL less than 70 11/01/2010  . Diabetes mellitus type 2 in obese   . Hypertension   . Obesity (BMI 30-39.9)   . Diverticulosis of sigmoid colon     Subjective:  CC:   Chief Complaint  Patient presents with  . Follow-up    DM 86 fasting BS today    HPI:   Brenda Floyd is a 73 y.o. female who presents for 3 month follow  Up on DM type 2, controlled with Lantus,  Morbid obesity complicated by DJD knees    Had a recent Flare up of right SI joint arthritis for the entire summer (May/june/july) and slept in a recliner  And took  tramadol /2 tylenol every 6 hours.  Her back pain eventually  Improved after seeing a chiropractor who treated her with "dry needling" of trigger points.  During this ordeal she developed anorexia and lost  28 lbs.   Loose stools.  For several weeks she had recurrent explosive bowel movements without fevers, nausea, abdominal pain and hematochezia.  She took a herbal remedy for "leaky bowel" offered by y chiropractor and the diarrhea has resolved.    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   DM: her blood sugars have been < 130 fasting and < 150 on 2 hr post prandial checks.  She has not increased her lantus dose since last visit.     Past Medical History  Diagnosis Date  . Colon polyps 2012    [facility], followup due 2015  . Diverticulosis of sigmoid colon July 2012    by colonoscopy  . Fracture closed,  femur, shaft remote    s/p screws and rod repair  . Diabetes mellitus   . Hypertension   . Thyroid disease     Past Surgical History  Procedure Laterality Date  . Joint replacement      bilateral knee, 2011  . Abdominal hysterectomy         The following portions of the patient's history were reviewed and updated as appropriate: Allergies, current medications, and problem list.    Review of Systems:   Patient denies headache, fevers, malaise, unintentional weight loss, skin rash, eye pain, sinus congestion and sinus pain, sore throat, dysphagia,  hemoptysis , cough, dyspnea, wheezing, chest pain, palpitations, orthopnea, edema, abdominal pain, nausea, melena, diarrhea, constipation, flank pain, dysuria, hematuria, urinary  Frequency, nocturia, numbness, tingling, seizures,  Focal weakness, Loss of consciousness,  Tremor, insomnia, depression, anxiety, and suicidal ideation.     History   Social History  . Marital Status: Single    Spouse Name: N/A    Number of Children: N/A  . Years of Education: N/A   Occupational History  . Not on file.   Social History Main Topics  . Smoking status: Former Smoker    Quit date: 10/30/1990  . Smokeless tobacco: Never Used  . Alcohol Use: No  . Drug  Use: No  . Sexual Activity: Not on file   Other Topics Concern  . Not on file   Social History Narrative  . No narrative on file    Objective:  Filed Vitals:   10/18/13 1527  BP: 136/82  Pulse: 94  Temp: 97.8 F (36.6 C)  Resp: 14     General appearance: alert, cooperative and appears stated age Ears: normal TM's and external ear canals both ears Throat: lips, mucosa, and tongue normal; teeth and gums normal Neck: no adenopathy, no carotid bruit, supple, symmetrical, trachea midline and thyroid not enlarged, symmetric, no tenderness/mass/nodules Back: symmetric, no curvature. ROM normal. No CVA tenderness. Lungs: clear to auscultation bilaterally Heart: regular rate  and rhythm, S1, S2 normal, no murmur, click, rub or gallop Abdomen: soft, non-tender; bowel sounds normal; no masses,  no organomegaly Pulses: 2+ and symmetric Skin: Skin color, texture, turgor normal. No rashes or lesions Lymph nodes: Cervical, supraclavicular, and axillary nodes normal.  Assessment and Plan:  Diabetes mellitus type 2 in obese  well-controlled on current medications.  hemoglobin A1c has been consistently at or  less than 6.0 . Patient is up-to-date on eye exams and foot exam is normal today. Patient is due for urine microalbumin to creatinine ratio at next visit. Patient is tolerating statin therapy for CAD risk reduction and on ACE/ARB for reduction in proteinuria.   Lab Results  Component Value Date   HGBA1C 5.7 10/20/2013   Lab Results  Component Value Date   MICROALBUR 0.50 05/26/2013    Hyperlipidemia with target LDL less than 70 She is tolerating current statin therapy.   Liver enzymes are normal , no changes today.  Obesity (BMI 30-39.9) I have congratulated her in reduction of   BMI and encouraged  Continued weight loss with goal of 10% of body weigh over the next 6 months using a low glycemic index diet and regular exercise a minimum of 5 days per week.    Sacroiliac joint disease She has recurrent episodes of low back pain managed with tramadol. Her pain is nonradiating so no MRI has been done recently.     Updated Medication List Outpatient Encounter Prescriptions as of 10/18/2013  Medication Sig  . acetaminophen (TYLENOL) 325 MG tablet Take 650 mg by mouth as needed.    Marland Kitchen aspirin 81 MG tablet Take 1 tablet (81 mg total) by mouth daily.  Marland Kitchen atorvastatin (LIPITOR) 10 MG tablet Take 1 tablet (10 mg total) by mouth daily.  . Cholecalciferol (VITAMIN D3) 5000 UNITS CAPS Take 1 tablet by mouth daily.    Marland Kitchen co-enzyme Q-10 30 MG capsule Take 1 capsule (30 mg total) by mouth daily.  . diphenhydrAMINE (BENADRYL) 25 mg capsule Take 25 mg by mouth as needed.    Marland Kitchen  glucose blood (FREESTYLE LITE) test strip Use to check blood sugars once daily,  Twice if needed   DM 250.00  . Insulin Glargine (LANTUS SOLOSTAR) 100 UNIT/ML Solostar Pen INJECT 26 UNITS UNDER THE SKIN DAILY  . levothyroxine (SYNTHROID, LEVOTHROID) 75 MCG tablet TAKE 1 TABLET DAILY FOR HYPOTHYROIDISM  . lisinopril (PRINIVIL,ZESTRIL) 20 MG tablet TAKE ONE TABLET BY MOUTH ONCE DAILY  . loratadine (CLARITIN) 10 MG tablet Take 10 mg by mouth daily.  . meloxicam (MOBIC) 15 MG tablet TAKE ONE TABLET BY MOUTH EVERY DAY  . metFORMIN (GLUCOPHAGE-XR) 750 MG 24 hr tablet TAKE 1 TABLET THREE TIMES A DAY  . Misc Natural Products (CYSTEX) LIQD Take 15 mLs by mouth daily.  Marland Kitchen  PARoxetine (PAXIL) 20 MG tablet TAKE 1 TABLET DAILY  . Red Yeast Rice 600 MG CAPS Take 1 capsule by mouth 2 (two) times daily.  . traMADol (ULTRAM) 50 MG tablet TAKE 1 TABLET FOUR TIMES A DAY AS NEEDED FOR KNEE PAIN  . TURMERIC PO Take by mouth daily.  . vitamin B-12 (CYANOCOBALAMIN) 1000 MCG tablet Take 1,000 mcg by mouth daily.  Marland Kitchen amoxicillin (AMOXIL) 500 MG capsule Take 4 capsules (2,000 mg total) by mouth once. Take 2000 mg one hour before dental procedures.  . [DISCONTINUED] amoxicillin (AMOXIL) 500 MG capsule TAKE FOUR CAPSULES BY MOUTH ONE HOUR BEFORE APPOINTMENT  . [DISCONTINUED] Coenzyme Q10 (COQ10) 100 MG CAPS Take 1 tablet by mouth daily.   . [DISCONTINUED] PARoxetine (PAXIL) 20 MG tablet TAKE 1 TABLET DAILY     Orders Placed This Encounter  Procedures  . DG Bone Density  . Comprehensive metabolic panel  . Hemoglobin A1c  . Lipid panel  . B12  . HM DIABETES FOOT EXAM    No Follow-up on file.

## 2013-10-20 ENCOUNTER — Other Ambulatory Visit (INDEPENDENT_AMBULATORY_CARE_PROVIDER_SITE_OTHER): Payer: Medicare Other

## 2013-10-20 DIAGNOSIS — Z79899 Other long term (current) drug therapy: Secondary | ICD-10-CM

## 2013-10-20 DIAGNOSIS — E785 Hyperlipidemia, unspecified: Secondary | ICD-10-CM

## 2013-10-20 DIAGNOSIS — E119 Type 2 diabetes mellitus without complications: Secondary | ICD-10-CM | POA: Diagnosis not present

## 2013-10-20 DIAGNOSIS — E538 Deficiency of other specified B group vitamins: Secondary | ICD-10-CM

## 2013-10-20 LAB — LIPID PANEL
CHOL/HDL RATIO: 4
Cholesterol: 168 mg/dL (ref 0–200)
HDL: 47.5 mg/dL (ref >39.00)
NonHDL: 120.5
TRIGLYCERIDES: 235 mg/dL — AB (ref 0.0–149.0)
VLDL: 47 mg/dL — ABNORMAL HIGH (ref 0.0–40.0)

## 2013-10-20 LAB — COMPREHENSIVE METABOLIC PANEL
ALBUMIN: 3.7 g/dL (ref 3.5–5.2)
ALK PHOS: 58 U/L (ref 39–117)
ALT: 19 U/L (ref 0–35)
AST: 18 U/L (ref 0–37)
BUN: 13 mg/dL (ref 6–23)
CO2: 25 mEq/L (ref 19–32)
CREATININE: 0.7 mg/dL (ref 0.4–1.2)
Calcium: 9.2 mg/dL (ref 8.4–10.5)
Chloride: 106 mEq/L (ref 96–112)
GFR: 88.1 mL/min (ref >60.00)
Glucose, Bld: 104 mg/dL — ABNORMAL HIGH (ref 70–99)
POTASSIUM: 4.3 meq/L (ref 3.5–5.1)
Sodium: 139 mEq/L (ref 135–145)
Total Bilirubin: 0.7 mg/dL (ref 0.2–1.2)
Total Protein: 6.5 g/dL (ref 6.0–8.3)

## 2013-10-20 LAB — HEMOGLOBIN A1C: Hgb A1c MFr Bld: 5.7 % (ref 4.6–6.5)

## 2013-10-20 LAB — LDL CHOLESTEROL, DIRECT: Direct LDL: 91.8 mg/dL

## 2013-10-20 LAB — VITAMIN B12: VITAMIN B 12: 187 pg/mL — AB (ref 211–911)

## 2013-10-21 NOTE — Assessment & Plan Note (Signed)
She has recurrent episodes of low back pain managed with tramadol. Her pain is nonradiating so no MRI has been done recently.

## 2013-10-21 NOTE — Assessment & Plan Note (Signed)
well-controlled on current medications.  hemoglobin A1c has been consistently at or  less than 6.0 . Patient is up-to-date on eye exams and foot exam is normal today. Patient is due for urine microalbumin to creatinine ratio at next visit. Patient is tolerating statin therapy for CAD risk reduction and on ACE/ARB for reduction in proteinuria.   Lab Results  Component Value Date   HGBA1C 5.7 10/20/2013   Lab Results  Component Value Date   MICROALBUR 0.50 05/26/2013

## 2013-10-21 NOTE — Assessment & Plan Note (Signed)
She is tolerating current statin therapy.   Liver enzymes are normal , no changes today.

## 2013-10-21 NOTE — Assessment & Plan Note (Signed)
I have congratulated her in reduction of   BMI and encouraged  Continued weight loss with goal of 10% of body weigh over the next 6 months using a low glycemic index diet and regular exercise a minimum of 5 days per week.    

## 2013-10-22 ENCOUNTER — Encounter: Payer: Self-pay | Admitting: Internal Medicine

## 2013-10-22 DIAGNOSIS — E538 Deficiency of other specified B group vitamins: Secondary | ICD-10-CM | POA: Insufficient documentation

## 2013-10-22 MED ORDER — SYRINGE (DISPOSABLE) 1 ML MISC: Status: DC

## 2013-10-22 MED ORDER — CYANOCOBALAMIN 1000 MCG/ML IJ SOLN: INTRAMUSCULAR | Status: DC

## 2013-10-22 NOTE — Assessment & Plan Note (Signed)
Likely due to metformin use.  IM injections advised,  Daily,  Then weekly,, then weekly/monthly.

## 2013-10-22 NOTE — Addendum Note (Signed)
Addended by: Crecencio Mc on: 10/22/2013 08:15 AM   Modules accepted: Orders

## 2013-10-23 ENCOUNTER — Other Ambulatory Visit: Payer: Self-pay | Admitting: *Deleted

## 2013-10-23 MED ORDER — "SYRINGE 25G X 1"" 3 ML MISC": Status: DC

## 2013-10-23 NOTE — Telephone Encounter (Signed)
Fax from [facility], requesting 3 mL syringe with 1 inch needle rather than 1 mL syringe. Rx sent to pharmacy by escript

## 2013-11-24 ENCOUNTER — Other Ambulatory Visit: Payer: Self-pay | Admitting: Internal Medicine

## 2013-12-07 ENCOUNTER — Other Ambulatory Visit: Payer: Self-pay | Admitting: Internal Medicine

## 2013-12-13 ENCOUNTER — Telehealth: Payer: Self-pay | Admitting: Internal Medicine

## 2014-01-15 ENCOUNTER — Other Ambulatory Visit: Payer: Self-pay | Admitting: Internal Medicine

## 2014-01-15 ENCOUNTER — Encounter: Payer: Self-pay | Admitting: Internal Medicine

## 2014-01-16 MED ORDER — LISINOPRIL 20 MG PO TABS: 20.0000 mg | ORAL_TABLET | Freq: Every day | ORAL | Status: DC

## 2014-01-30 ENCOUNTER — Other Ambulatory Visit: Payer: Self-pay | Admitting: Internal Medicine

## 2014-01-31 NOTE — Telephone Encounter (Signed)
Last OV 9.9.15, last refill 8.27.15.  please advise refill

## 2014-01-31 NOTE — Telephone Encounter (Signed)
Ok to refill,  printed rx  

## 2014-01-31 NOTE — Telephone Encounter (Signed)
rx faxed by Joycelyn Schmid, lpn

## 2014-02-08 ENCOUNTER — Encounter: Payer: Self-pay | Admitting: *Deleted

## 2014-02-23 ENCOUNTER — Encounter: Payer: Self-pay | Admitting: Internal Medicine

## 2014-02-26 MED ORDER — AMOXICILLIN 500 MG PO CAPS: 2000.0000 mg | ORAL_CAPSULE | Freq: Once | ORAL | Status: DC

## 2014-02-27 ENCOUNTER — Other Ambulatory Visit: Payer: Self-pay | Admitting: Internal Medicine

## 2014-02-27 NOTE — Telephone Encounter (Signed)
Sent mychart for dose clarification.

## 2014-04-14 ENCOUNTER — Other Ambulatory Visit: Payer: Self-pay | Admitting: Internal Medicine

## 2014-04-25 ENCOUNTER — Encounter: Payer: Self-pay | Admitting: Internal Medicine

## 2014-04-25 ENCOUNTER — Ambulatory Visit (INDEPENDENT_AMBULATORY_CARE_PROVIDER_SITE_OTHER): Payer: Medicare Other | Admitting: Internal Medicine

## 2014-04-25 VITALS — BP 120/78 | HR 75 | Temp 97.5°F | Resp 16 | Ht 65.0 in | Wt 248.8 lb

## 2014-04-25 DIAGNOSIS — E119 Type 2 diabetes mellitus without complications: Secondary | ICD-10-CM

## 2014-04-25 DIAGNOSIS — E669 Obesity, unspecified: Secondary | ICD-10-CM | POA: Diagnosis not present

## 2014-04-25 DIAGNOSIS — E038 Other specified hypothyroidism: Secondary | ICD-10-CM | POA: Diagnosis not present

## 2014-04-25 DIAGNOSIS — E034 Atrophy of thyroid (acquired): Secondary | ICD-10-CM | POA: Diagnosis not present

## 2014-04-25 DIAGNOSIS — F418 Other specified anxiety disorders: Secondary | ICD-10-CM | POA: Diagnosis not present

## 2014-04-25 DIAGNOSIS — E538 Deficiency of other specified B group vitamins: Secondary | ICD-10-CM | POA: Diagnosis not present

## 2014-04-25 DIAGNOSIS — I10 Essential (primary) hypertension: Secondary | ICD-10-CM | POA: Diagnosis not present

## 2014-04-25 DIAGNOSIS — E559 Vitamin D deficiency, unspecified: Secondary | ICD-10-CM

## 2014-04-25 DIAGNOSIS — E1169 Type 2 diabetes mellitus with other specified complication: Secondary | ICD-10-CM

## 2014-04-25 DIAGNOSIS — E785 Hyperlipidemia, unspecified: Secondary | ICD-10-CM | POA: Diagnosis not present

## 2014-04-25 LAB — T3, FREE: T3, Free: 3.4 pg/mL (ref 2.3–4.2)

## 2014-04-25 LAB — COMPREHENSIVE METABOLIC PANEL
ALK PHOS: 63 U/L (ref 39–117)
ALT: 29 U/L (ref 0–35)
AST: 25 U/L (ref 0–37)
Albumin: 4.4 g/dL (ref 3.5–5.2)
BILIRUBIN TOTAL: 0.5 mg/dL (ref 0.2–1.2)
BUN: 19 mg/dL (ref 6–23)
CHLORIDE: 101 meq/L (ref 96–112)
CO2: 28 meq/L (ref 19–32)
CREATININE: 0.71 mg/dL (ref 0.40–1.20)
Calcium: 9.6 mg/dL (ref 8.4–10.5)
GFR: 85.13 mL/min (ref >60.00)
Glucose, Bld: 92 mg/dL (ref 70–99)
Potassium: 4.7 mEq/L (ref 3.5–5.1)
Sodium: 136 mEq/L (ref 135–145)
Total Protein: 7.2 g/dL (ref 6.0–8.3)

## 2014-04-25 LAB — LIPID PANEL
CHOL/HDL RATIO: 3
Cholesterol: 207 mg/dL — ABNORMAL HIGH (ref 0–200)
HDL: 63.2 mg/dL (ref >39.00)
NONHDL: 143.8
Triglycerides: 238 mg/dL — ABNORMAL HIGH (ref 0.0–149.0)
VLDL: 47.6 mg/dL — AB (ref 0.0–40.0)

## 2014-04-25 LAB — MICROALBUMIN / CREATININE URINE RATIO
Creatinine,U: 45.8 mg/dL
MICROALB/CREAT RATIO: 1.5 mg/g (ref 0.0–30.0)

## 2014-04-25 LAB — HEMOGLOBIN A1C: Hgb A1c MFr Bld: 5.9 % (ref 4.6–6.5)

## 2014-04-25 LAB — TSH: TSH: 2.03 u[IU]/mL (ref 0.35–4.50)

## 2014-04-25 LAB — LDL CHOLESTEROL, DIRECT: Direct LDL: 117 mg/dL

## 2014-04-25 LAB — VITAMIN D 25 HYDROXY (VIT D DEFICIENCY, FRACTURES): VITD: 47.87 ng/mL (ref 30.00–100.00)

## 2014-04-25 NOTE — Patient Instructions (Signed)
Diabetes and Standards of Medical Care Diabetes is complicated. You may find that your diabetes team includes a dietitian, nurse, diabetes educator, eye doctor, and more. To help everyone know what is going on and to help you get the care you deserve, the following schedule of care was developed to help keep you on track. Below are the tests, exams, vaccines, medicines, education, and plans you will need. HbA1c test This test shows how well you have controlled your glucose over the past 2-3 months. It is used to see if your diabetes management plan needs to be adjusted.   It is performed at least 2 times a year if you are meeting treatment goals.  It is performed 4 times a year if therapy has changed or if you are not meeting treatment goals. Blood pressure test  This test is performed at every routine medical visit. The goal is less than 140/90 mm Hg for most people, but 130/80 mm Hg in some cases. Ask your health care provider about your goal. Dental exam  Follow up with the dentist regularly. Eye exam  If you are diagnosed with type 1 diabetes as a child, get an exam upon reaching the age of 37 years or older and have had diabetes for 3-5 years. Yearly eye exams are recommended after that initial eye exam.  If you are diagnosed with type 1 diabetes as an adult, get an exam within 5 years of diagnosis and then yearly.  If you are diagnosed with type 2 diabetes, get an exam as soon as possible after the diagnosis and then yearly. Foot care exam  Visual foot exams are performed at every routine medical visit. The exams check for cuts, injuries, or other problems with the feet.  A comprehensive foot exam should be done yearly. This includes visual inspection as well as assessing foot pulses and testing for loss of sensation.  Check your feet nightly for cuts, injuries, or other problems with your feet. Tell your health care provider if anything is not healing. Kidney function test (urine  microalbumin)  This test is performed once a year.  Type 1 diabetes: The first test is performed 5 years after diagnosis.  Type 2 diabetes: The first test is performed at the time of diagnosis.  A serum creatinine and estimated glomerular filtration rate (eGFR) test is done once a year to assess the level of chronic kidney disease (CKD), if present. Lipid profile (cholesterol, HDL, LDL, triglycerides)  Performed every 5 years for most people.  The goal for LDL is less than 100 mg/dL. If you are at high risk, the goal is less than 70 mg/dL.  The goal for HDL is 40 mg/dL-50 mg/dL for men and 50 mg/dL-60 mg/dL for women. An HDL cholesterol of 60 mg/dL or higher gives some protection against heart disease.  The goal for triglycerides is less than 150 mg/dL. Influenza vaccine, pneumococcal vaccine, and hepatitis B vaccine  The influenza vaccine is recommended yearly.  It is recommended that people with diabetes who are over 24 years old get the pneumonia vaccine. In some cases, two separate shots may be given. Ask your health care provider if your pneumonia vaccination is up to date.  The hepatitis B vaccine is also recommended for adults with diabetes. Diabetes self-management education  Education is recommended at diagnosis and ongoing as needed. Treatment plan  Your treatment plan is reviewed at every medical visit. Document Released: 11/23/2008 Document Revised: 06/12/2013 Document Reviewed: 06/28/2012 [facility] Patient Information 2015 [place]. This information is not intended to replace advice given to you by your health care provider. Make sure you discuss any questions you have with your health care provider.  

## 2014-04-25 NOTE — Progress Notes (Signed)
Patient ID: Brenda Floyd, female   DOB: August 18, 1940, 74 y.o.   MRN: [id number]   Patient Active Problem List   Diagnosis Date Noted  . Hypothyroidism due to acquired atrophy of thyroid 04/28/2014  . Depression with anxiety 04/28/2014  . Vitamin B12 deficiency (non anemic) 10/22/2013  . Statin intolerance 02/26/2013  . S/P TKR (total knee replacement) 02/25/2013  . Fecal incontinence alternating with constipation 11/08/2012  . Sacroiliac joint disease 02/01/2011  . Hyperlipidemia with target LDL less than 70 11/01/2010  . Diabetes mellitus type 2 in obese   . Essential hypertension   . Obesity (BMI 30-39.9)   . Diverticulosis of sigmoid colon     Subjective:  CC:   Chief Complaint  Patient presents with  . Follow-up  . Diabetes  . Hypertension    HPI:   Brenda Floyd is a 74 y.o. female who presents for   Follow up on Type 2 DM managed with Lantus. She feels generally well, is not exercising but is following a low glycemic index diet andas physically active as she can tolerate given her age and comorbidities.  checking blood sugars once daily at variable times.  BS have been under 130 fasting and < 150 post prandially.  Denies any recent hypoglyemic events.  Taking her medications as directed. . Denies numbness, burning and tingling of extremities. Appetite is good.     Wt Readings from Last 3 Encounters:  04/25/14 248 lb 12 oz (112.832 kg)  10/18/13 255 lb 8 oz (115.894 kg)  05/26/13 273 lb 8 oz (124.059 kg)    Has lost 25 lbs over the last year.  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    Still using 26 units of Lantus,  going to try the wheat belly diet.        Past Medical History  Diagnosis Date  . Colon polyps 2012    [facility], followup due 2015  . Diverticulosis of sigmoid colon July 2012    by colonoscopy  .  Fracture closed, femur, shaft remote    s/p screws and rod repair  . Diabetes mellitus   . Hypertension   . Thyroid disease     Past Surgical History  Procedure Laterality Date  . Joint replacement      bilateral knee, 2011  . Abdominal hysterectomy         The following portions of the patient's history were reviewed and updated as appropriate: Allergies, current medications, and problem list.    Review of Systems:   Patient denies headache, fevers, malaise, unintentional weight loss, skin rash, eye pain, sinus congestion and sinus pain, sore throat, dysphagia,  hemoptysis , cough, dyspnea, wheezing, chest pain, palpitations, orthopnea, edema, abdominal pain, nausea, melena, diarrhea, constipation, flank pain, dysuria, hematuria, urinary  Frequency, nocturia, numbness, tingling, seizures,  Focal weakness, Loss of consciousness,  Tremor, insomnia, depression, anxiety, and suicidal ideation.     History   Social History  . Marital Status: Single    Spouse Name: N/A  . Number of Children: N/A  . Years of Education: N/A   Occupational History  . Not on file.   Social History Main Topics  . Smoking status: Former Smoker    Quit date: 10/30/1990  . Smokeless tobacco: Never Used  . Alcohol Use: No  . Drug  Use: No  . Sexual Activity: Not on file   Other Topics Concern  . Not on file   Social History Narrative    Objective:  Filed Vitals:   04/25/14 1001  BP: 120/78  Pulse: 75  Temp: 97.5 F (36.4 C)  Resp: 16     General appearance: alert, cooperative and appears stated age Ears: normal TM's and external ear canals both ears Throat: lips, mucosa, and tongue normal; teeth and gums normal Neck: no adenopathy, no carotid bruit, supple, symmetrical, trachea midline and thyroid not enlarged, symmetric, no tenderness/mass/nodules Back: symmetric, no curvature. ROM normal. No CVA tenderness. Lungs: clear to auscultation bilaterally Heart: regular rate and  rhythm, S1, S2 normal, no murmur, click, rub or gallop Abdomen: soft, non-tender; bowel sounds normal; no masses,  no organomegaly Pulses: 2+ and symmetric Skin: Skin color, texture, turgor normal. No rashes or lesions Lymph nodes: Cervical, supraclavicular, and axillary nodes normal.  Assessment and Plan:  Essential hypertension Well controlled on current regimen. Renal function stable, no changes today.  Lab Results  Component Value Date   CREATININE 0.71 04/25/2014   Lab Results  Component Value Date   NA 136 04/25/2014   K 4.7 04/25/2014   CL 101 04/25/2014   CO2 28 04/25/2014      Diabetes mellitus type 2 in obese  well-controlled on dose of Lantus .  hemoglobin A1c has been consistently at or  less than 6.0 . Patient is up-to-date on eye exams and foot exam is normal today. Patient ihas no evidence of  microalbuminuria Patient is tolerating aspirin and statin therapy for CAD risk reduction and is already on on ACE/ARB for reduction in proteinuria.   Lab Results  Component Value Date   HGBA1C 5.9 04/25/2014   Lab Results  Component Value Date   MICROALBUR <0.7 04/25/2014       Vitamin B12 deficiency (non anemic) Managed with self injections of B12 monthly since diagnosis.   Lab Results  Component Value Date   [id number] 283* 10/20/2013      Hyperlipidemia with target LDL less than 70 She is tolerating current statin therapy.   Liver enzymes are normal , no changes today.  Lab Results  Component Value Date   CHOL 207* 04/25/2014   HDL 63.20 04/25/2014   LDLCALC 167* 05/26/2013   LDLDIRECT 117.0 04/25/2014   TRIG 238.0* 04/25/2014   CHOLHDL 3 04/25/2014   Lab Results  Component Value Date   ALT 29 04/25/2014   AST 25 04/25/2014   ALKPHOS 63 04/25/2014   BILITOT 0.5 04/25/2014      Obesity (BMI 30-39.9) I have congratulated her in reduction of   BMI and encouraged  Continued weight loss with goal of 10% of body weigh over the next 6 months  using a low glycemic index diet and regular exercise a minimum of 5 days per week.       Hypothyroidism due to acquired atrophy of thyroid Thyroid function is WNL on current dose.  No current changes needed.   Lab Results  Component Value Date   TSH 2.03 04/25/2014     Depression with anxiety Managed for years with stable dose of paroxetine.  Will discuss tapering off trial at next visit.    A total of 25 minutes of face to face time was spent with patient more than half of which was spent in counselling and coordination of care    Updated Medication List Outpatient Encounter Prescriptions as of 04/25/2014  Medication  Sig  . acetaminophen (TYLENOL) 325 MG tablet Take 650 mg by mouth as needed.    Marland Kitchen amoxicillin (AMOXIL) 500 MG capsule Take 4 capsules (2,000 mg total) by mouth once. Take 2000 mg one hour before dental procedures. (Patient not taking: Reported on 04/25/2014)  . aspirin 81 MG tablet Take 1 tablet (81 mg total) by mouth daily.  Marland Kitchen atorvastatin (LIPITOR) 10 MG tablet Take 1 tablet (10 mg total) by mouth daily.  . Cholecalciferol (VITAMIN D3) 5000 UNITS CAPS Take 1 tablet by mouth daily.    Marland Kitchen co-enzyme Q-10 30 MG capsule Take 1 capsule (30 mg total) by mouth daily.  . cyanocobalamin (,VITAMIN B-12,) 1000 MCG/ML injection Inject 1 ml daily x 4, then weekly x 3,  Then monthly  . diphenhydrAMINE (BENADRYL) 25 mg capsule Take 25 mg by mouth as needed.    . fexofenadine (ALLEGRA) 180 MG tablet Take 180 mg by mouth daily.  Marland Kitchen glucose blood (FREESTYLE LITE) test strip Use to check blood sugars once daily,  Twice if needed   DM 250.00  . Insulin Glargine (LANTUS SOLOSTAR) 100 UNIT/ML Solostar Pen INJECT 26 UNITS UNDER THE SKIN DAILY  . levothyroxine (SYNTHROID, LEVOTHROID) 75 MCG tablet TAKE 1 TABLET DAILY FOR HYPOTHYROIDISM  . lisinopril (PRINIVIL,ZESTRIL) 20 MG tablet Take 1 tablet (20 mg total) by mouth daily.  . magnesium gluconate (MAGONATE) 500 MG tablet Take 500 mg by mouth  daily.  . meloxicam (MOBIC) 15 MG tablet TAKE ONE TABLET BY MOUTH EVERY DAY  . metFORMIN (GLUCOPHAGE-XR) 750 MG 24 hr tablet TAKE 1 TABLET THREE TIMES A DAY  . Misc Natural Products (CYSTEX) LIQD Take 15 mLs by mouth daily.  Marland Kitchen PARoxetine (PAXIL) 20 MG tablet TAKE 1 TABLET DAILY  . Red Yeast Rice 600 MG CAPS Take 1 capsule by mouth 2 (two) times daily.  . Syringe, Disposable, 1 ML MISC For use with b12 serum  . Syringe/Needle, Disp, (SYRINGE 3CC/25GX1") 25G X 1" 3 ML MISC Use as directed  . traMADol (ULTRAM) 50 MG tablet TAKE ONE TABLET BY MOUTH 4 TIMES DAILY AS NEEDED FOR KNEE PAIN  . TURMERIC PO Take by mouth daily.  . [DISCONTINUED] loratadine (CLARITIN) 10 MG tablet Take 10 mg by mouth daily.     Orders Placed This Encounter  Procedures  . Comprehensive metabolic panel  . Hemoglobin A1c  . Lipid panel  . TSH  . T3, free  . Vit D  25 hydroxy (rtn osteoporosis monitoring)  . Microalbumin / creatinine urine ratio  . LDL cholesterol, direct  . HM DIABETES EYE EXAM    Return in about 3 months (around 07/26/2014) for follow up diabetes.

## 2014-04-26 ENCOUNTER — Telehealth: Payer: Self-pay | Admitting: Internal Medicine

## 2014-04-26 NOTE — Telephone Encounter (Signed)
emmi emailed °

## 2014-04-27 ENCOUNTER — Encounter: Payer: Self-pay | Admitting: Internal Medicine

## 2014-04-28 ENCOUNTER — Encounter: Payer: Self-pay | Admitting: Internal Medicine

## 2014-04-28 DIAGNOSIS — E034 Atrophy of thyroid (acquired): Secondary | ICD-10-CM | POA: Insufficient documentation

## 2014-04-28 DIAGNOSIS — F418 Other specified anxiety disorders: Secondary | ICD-10-CM | POA: Insufficient documentation

## 2014-04-28 NOTE — Assessment & Plan Note (Signed)
Well controlled on current regimen. Renal function stable, no changes today.  Lab Results  Component Value Date   CREATININE 0.71 04/25/2014   Lab Results  Component Value Date   NA 136 04/25/2014   K 4.7 04/25/2014   CL 101 04/25/2014   CO2 28 04/25/2014

## 2014-04-28 NOTE — Assessment & Plan Note (Signed)
Managed for years with stable dose of paroxetine.  Will discuss tapering off trial at next visit.

## 2014-04-28 NOTE — Assessment & Plan Note (Signed)
She is tolerating current statin therapy.   Liver enzymes are normal , no changes today.  Lab Results  Component Value Date   CHOL 207* 04/25/2014   HDL 63.20 04/25/2014   LDLCALC 167* 05/26/2013   LDLDIRECT 117.0 04/25/2014   TRIG 238.0* 04/25/2014   CHOLHDL 3 04/25/2014   Lab Results  Component Value Date   ALT 29 04/25/2014   AST 25 04/25/2014   ALKPHOS 63 04/25/2014   BILITOT 0.5 04/25/2014

## 2014-04-28 NOTE — Assessment & Plan Note (Signed)
Thyroid function is WNL on current dose.  No current changes needed.   Lab Results  Component Value Date   TSH 2.03 04/25/2014

## 2014-04-28 NOTE — Assessment & Plan Note (Addendum)
well-controlled on dose of Lantus .  hemoglobin A1c has been consistently at or  less than 6.0 . Patient is up-to-date on eye exams and foot exam is normal today. Patient ihas no evidence of  microalbuminuria Patient is tolerating aspirin and statin therapy for CAD risk reduction and is already on on ACE/ARB for reduction in proteinuria.   Lab Results  Component Value Date   HGBA1C 5.9 04/25/2014   Lab Results  Component Value Date   MICROALBUR <0.7 04/25/2014

## 2014-04-28 NOTE — Assessment & Plan Note (Signed)
Managed with self injections of B12 monthly since diagnosis.   Lab Results  Component Value Date   [id number] 187* 10/20/2013

## 2014-04-28 NOTE — Assessment & Plan Note (Signed)
I have congratulated her in reduction of   BMI and encouraged  Continued weight loss with goal of 10% of body weigh over the next 6 months using a low glycemic index diet and regular exercise a minimum of 5 days per week.    

## 2014-05-11 ENCOUNTER — Other Ambulatory Visit: Payer: Self-pay | Admitting: *Deleted

## 2014-05-11 MED ORDER — PAROXETINE HCL 20 MG PO TABS: 20.0000 mg | ORAL_TABLET | Freq: Every day | ORAL | Status: DC

## 2014-05-31 ENCOUNTER — Other Ambulatory Visit: Payer: Self-pay | Admitting: Internal Medicine

## 2014-06-06 ENCOUNTER — Other Ambulatory Visit: Payer: Self-pay | Admitting: Internal Medicine

## 2014-06-06 NOTE — Telephone Encounter (Signed)
Last visit 04/25/14, ok refill?

## 2014-06-07 NOTE — Telephone Encounter (Signed)
90 day supply authorized and sent   

## 2014-07-16 ENCOUNTER — Other Ambulatory Visit: Payer: Self-pay | Admitting: Internal Medicine

## 2014-07-26 ENCOUNTER — Encounter: Payer: Self-pay | Admitting: Internal Medicine

## 2014-07-26 ENCOUNTER — Ambulatory Visit (INDEPENDENT_AMBULATORY_CARE_PROVIDER_SITE_OTHER): Payer: Medicare Other | Admitting: Internal Medicine

## 2014-07-26 VITALS — BP 126/80 | HR 74 | Temp 97.6°F | Resp 14 | Ht 65.0 in | Wt 255.5 lb

## 2014-07-26 DIAGNOSIS — I10 Essential (primary) hypertension: Secondary | ICD-10-CM

## 2014-07-26 DIAGNOSIS — M545 Low back pain, unspecified: Secondary | ICD-10-CM | POA: Insufficient documentation

## 2014-07-26 DIAGNOSIS — E538 Deficiency of other specified B group vitamins: Secondary | ICD-10-CM | POA: Diagnosis not present

## 2014-07-26 DIAGNOSIS — E1169 Type 2 diabetes mellitus with other specified complication: Secondary | ICD-10-CM

## 2014-07-26 DIAGNOSIS — E119 Type 2 diabetes mellitus without complications: Secondary | ICD-10-CM | POA: Diagnosis not present

## 2014-07-26 DIAGNOSIS — E669 Obesity, unspecified: Secondary | ICD-10-CM

## 2014-07-26 DIAGNOSIS — Z1239 Encounter for other screening for malignant neoplasm of breast: Secondary | ICD-10-CM

## 2014-07-26 DIAGNOSIS — M25571 Pain in right ankle and joints of right foot: Secondary | ICD-10-CM

## 2014-07-26 LAB — HM DIABETES FOOT EXAM: HM Diabetic Foot Exam: NORMAL

## 2014-07-26 LAB — COMPREHENSIVE METABOLIC PANEL
ALBUMIN: 4.2 g/dL (ref 3.5–5.2)
ALT: 27 U/L (ref 0–35)
AST: 20 U/L (ref 0–37)
Alkaline Phosphatase: 65 U/L (ref 39–117)
BUN: 22 mg/dL (ref 6–23)
CALCIUM: 9.7 mg/dL (ref 8.4–10.5)
CHLORIDE: 103 meq/L (ref 96–112)
CO2: 27 meq/L (ref 19–32)
CREATININE: 0.81 mg/dL (ref 0.40–1.20)
GFR: 73.07 mL/min (ref >60.00)
Glucose, Bld: 90 mg/dL (ref 70–99)
POTASSIUM: 4.4 meq/L (ref 3.5–5.1)
SODIUM: 137 meq/L (ref 135–145)
TOTAL PROTEIN: 6.9 g/dL (ref 6.0–8.3)
Total Bilirubin: 0.4 mg/dL (ref 0.2–1.2)

## 2014-07-26 LAB — HEMOGLOBIN A1C: Hgb A1c MFr Bld: 5.8 % (ref 4.6–6.5)

## 2014-07-26 LAB — VITAMIN B12: VITAMIN B 12: 396 pg/mL (ref 211–911)

## 2014-07-26 NOTE — Patient Instructions (Addendum)
Brenda Floyd green Smoothie De tox diet  To jump start your weight loss

## 2014-07-26 NOTE — Progress Notes (Signed)
Subjective:  Patient ID: Brenda Floyd, female    DOB: 01-12-1941  Age: 74 y.o. MRN: [id number]  CC: The primary encounter diagnosis was Midline low back pain without sciatica. Diagnoses of Diabetes mellitus type 2 in obese, Vitamin B12 deficiency (non anemic), Essential hypertension, Obesity (BMI 30-39.9), Pain in joint, ankle and foot, right, and Breast cancer screening were also pertinent to this visit.  HPI SONDRA Floyd presents for follow up on Type 2 DM, obesity, and hyperlipidemia.   Recent fall.  Missed the stop to the curb while walking [employer] fell onto left side, thinks sh may have cracked a rib , but symptoms of pain have improved and she does not want workup.  Ankle was tender for a while and was bruised and swollen.  Currently her symptoms have improved except for  Some stiffness in the morning  History of prior ankle fracture   She has been having a flare of IBS symptoms .   Blood sugars have been well controlled on stable doses  of Lantus.      Outpatient Prescriptions Prior to Visit  Medication Sig Dispense Refill  . acetaminophen (TYLENOL) 325 MG tablet Take 650 mg by mouth as needed.      Marland Kitchen amoxicillin (AMOXIL) 500 MG capsule Take 4 capsules (2,000 mg total) by mouth once. Take 2000 mg one hour before dental procedures. 4 capsule 1  . aspirin 81 MG tablet Take 1 tablet (81 mg total) by mouth daily. 30 tablet 11  . Cholecalciferol (VITAMIN D3) 5000 UNITS CAPS Take 1 tablet by mouth daily.      Marland Kitchen co-enzyme Q-10 30 MG capsule Take 1 capsule (30 mg total) by mouth daily. 90 capsule 3  . cyanocobalamin (,VITAMIN B-12,) 1000 MCG/ML injection Inject 1 ml daily x 4, then weekly x 3,  Then monthly 10 mL 2  . fexofenadine (ALLEGRA) 180 MG tablet Take 180 mg by mouth daily.    Marland Kitchen glucose blood (FREESTYLE LITE) test strip Use to check blood sugars once daily,  Twice if needed   DM 250.00 102 each 3  . Insulin Glargine (LANTUS SOLOSTAR) 100 UNIT/ML Solostar Pen  INJECT 26 UNITS UNDER THE SKIN DAILY    . levothyroxine (SYNTHROID, LEVOTHROID) 75 MCG tablet TAKE 1 TABLET DAILY FOR HYPOTHYROIDISM 90 tablet 1  . lisinopril (PRINIVIL,ZESTRIL) 20 MG tablet TAKE ONE TABLET BY MOUTH ONCE DAILY 90 tablet 1  . magnesium gluconate (MAGONATE) 500 MG tablet Take 500 mg by mouth daily.    . meloxicam (MOBIC) 15 MG tablet TAKE ONE TABLET BY MOUTH ONCE DAILY 90 tablet 1  . metFORMIN (GLUCOPHAGE-XR) 750 MG 24 hr tablet TAKE 1 TABLET THREE TIMES A DAY 270 tablet 0  . Misc Natural Products (CYSTEX) LIQD Take 15 mLs by mouth daily.    Marland Kitchen PARoxetine (PAXIL) 20 MG tablet Take 1 tablet (20 mg total) by mouth daily. 90 tablet 0  . Red Yeast Rice 600 MG CAPS Take 1 capsule by mouth 2 (two) times daily.    . Syringe, Disposable, 1 ML MISC For use with b12 serum 25 each 2  . Syringe/Needle, Disp, (SYRINGE 3CC/25GX1") 25G X 1" 3 ML MISC Use as directed 50 each 0  . traMADol (ULTRAM) 50 MG tablet TAKE ONE TABLET BY MOUTH 4 TIMES DAILY AS NEEDED FOR KNEE PAIN 120 tablet 3  . TURMERIC PO Take by mouth daily.    Marland Kitchen atorvastatin (LIPITOR) 10 MG tablet Take 1 tablet (10 mg total)  by mouth daily. (Patient not taking: Reported on 07/26/2014) 90 tablet 3  . diphenhydrAMINE (BENADRYL) 25 mg capsule Take 25 mg by mouth as needed.       No facility-administered medications prior to visit.    Review of Systems;  Patient denies headache, fevers, malaise, unintentional weight loss, skin rash, eye pain, sinus congestion and sinus pain, sore throat, dysphagia,  hemoptysis , cough, dyspnea, wheezing, chest pain, palpitations, orthopnea, edema, abdominal pain, nausea, melena, diarrhea, constipation, flank pain, dysuria, hematuria, urinary  Frequency, nocturia, numbness, tingling, seizures,  Focal weakness, Loss of consciousness,  Tremor, insomnia, depression, anxiety, and suicidal ideation.      Objective:  BP 126/80 mmHg  Pulse 74  Temp(Src) 97.6 F (36.4 C) (Oral)  Resp 14  Ht 5\' 5"  (1.651  m)  Wt 255 lb 8 oz (115.894 kg)  BMI 42.52 kg/m2  SpO2 98%  BP Readings from Last 3 Encounters:  07/26/14 126/80  04/25/14 120/78  10/18/13 136/82    Wt Readings from Last 3 Encounters:  07/26/14 255 lb 8 oz (115.894 kg)  04/25/14 248 lb 12 oz (112.832 kg)  10/18/13 255 lb 8 oz (115.894 kg)    General appearance: alert, cooperative and appears stated age Ears: normal TM's and external ear canals both ears Throat: lips, mucosa, and tongue normal; teeth and gums normal Neck: no adenopathy, no carotid bruit, supple, symmetrical, trachea midline and thyroid not enlarged, symmetric, no tenderness/mass/nodules Back: symmetric, no curvature. ROM normal. No CVA tenderness. Lungs: clear to auscultation bilaterally Heart: regular rate and rhythm, S1, S2 normal, no murmur, click, rub or gallop Abdomen: soft, non-tender; bowel sounds normal; no masses,  no organomegaly Pulses: 2+ and symmetric Skin: Skin color, texture, turgor normal. No rashes or lesions Lymph nodes: Cervical, supraclavicular, and axillary nodes normal.  Lab Results  Component Value Date   HGBA1C 5.8 07/26/2014   HGBA1C 5.9 04/25/2014   HGBA1C 5.7 10/20/2013    Lab Results  Component Value Date   CREATININE 0.81 07/26/2014   CREATININE 0.71 04/25/2014   CREATININE 0.7 10/20/2013    Lab Results  Component Value Date   WBC 5.3 08/08/2012   HGB 13.5 08/08/2012   HCT 41.2 08/08/2012   PLT 216.0 08/08/2012   GLUCOSE 90 07/26/2014   CHOL 207* 04/25/2014   TRIG 238.0* 04/25/2014   HDL 63.20 04/25/2014   LDLDIRECT 117.0 04/25/2014   LDLCALC 167* 05/26/2013   ALT 27 07/26/2014   AST 20 07/26/2014   NA 137 07/26/2014   K 4.4 07/26/2014   CL 103 07/26/2014   CREATININE 0.81 07/26/2014   BUN 22 07/26/2014   CO2 27 07/26/2014   TSH 2.03 04/25/2014   HGBA1C 5.8 07/26/2014   MICROALBUR <0.7 04/25/2014    No results found.  Assessment & Plan:   Problem List Items Addressed This Visit    Diabetes mellitus  type 2 in obese     well-controlled on dose of Lantus .  hemoglobin A1c has been consistently at or  less than 6.0 . Patient is up-to-date on eye exams and foot exam is normal today. Patient ihas no evidence of  microalbuminuria Patient is tolerating aspirin and red yeast rice  for CAD risk reduction and is already on on ACE/ARB .  Lab Results  Component Value Date   HGBA1C 5.8 07/26/2014   Lab Results  Component Value Date   MICROALBUR <0.7 04/25/2014            Essential hypertension  Well controlled on current regimen. Renal function stable, no changes today.  Lab Results  Component Value Date   CREATININE 0.81 07/26/2014   Lab Results  Component Value Date   NA 137 07/26/2014   K 4.4 07/26/2014   CL 103 07/26/2014   CO2 27 07/26/2014   .      Obesity (BMI 30-39.9)    I have addressed her weight gain and  recommended resuming a low glycemic index diet utilizing smaller more frequent meals to increase metabolism.  I have again recommended that patient start exercising with a goal of 30 minutes of aerobic exercise a minimum of 5 days per week.   Wt Readings from Last 3 Encounters:  07/26/14 255 lb 8 oz (115.894 kg)  04/25/14 248 lb 12 oz (112.832 kg)  10/18/13 255 lb 8 oz (115.894 kg)         Pain in joint, ankle and foot    Exam consistent with strain, no fractures.  Recent fall.  DEXA ordered       Lumbago - Primary   Relevant Orders   DG Bone Density   Vitamin B12 deficiency (non anemic)    Other Visit Diagnoses    Breast cancer screening        Relevant Orders    MM DIGITAL SCREENING BILATERAL       A total of 25 minutes of face to face time was spent with patient more than half of which was spent in counselling about the above mentioned conditions  and coordination of care  I am having Ms. Lottes maintain her Vitamin D3, diphenhydrAMINE, acetaminophen, aspirin, glucose blood, CYSTEX, Insulin Glargine, Red Yeast Rice, atorvastatin, co-enzyme Q-10,  TURMERIC PO, cyanocobalamin, Syringe (Disposable), SYRINGE 3CC/25GX1", traMADol, amoxicillin, metFORMIN, fexofenadine, magnesium gluconate, PARoxetine, levothyroxine, meloxicam, and lisinopril.  No orders of the defined types were placed in this encounter.    There are no discontinued medications.  Follow-up: Return in about 3 months (around 10/26/2014) for follow up diabetes.   Crecencio Mc, MD

## 2014-07-26 NOTE — Progress Notes (Signed)
Pre-visit discussion using our clinic review tool. No additional management support is needed unless otherwise documented below in the visit note.  

## 2014-07-28 DIAGNOSIS — M25579 Pain in unspecified ankle and joints of unspecified foot: Secondary | ICD-10-CM | POA: Insufficient documentation

## 2014-07-28 NOTE — Assessment & Plan Note (Signed)
I have addressed her weight gain and  recommended resuming a low glycemic index diet utilizing smaller more frequent meals to increase metabolism.  I have again recommended that patient start exercising with a goal of 30 minutes of aerobic exercise a minimum of 5 days per week.   Wt Readings from Last 3 Encounters:  07/26/14 255 lb 8 oz (115.894 kg)  04/25/14 248 lb 12 oz (112.832 kg)  10/18/13 255 lb 8 oz (115.894 kg)

## 2014-07-28 NOTE — Assessment & Plan Note (Signed)
well-controlled on dose of Lantus .  hemoglobin A1c has been consistently at or  less than 6.0 . Patient is up-to-date on eye exams and foot exam is normal today. Patient ihas no evidence of  microalbuminuria Patient is tolerating aspirin and red yeast rice  for CAD risk reduction and is already on on ACE/ARB .  Lab Results  Component Value Date   HGBA1C 5.8 07/26/2014   Lab Results  Component Value Date   MICROALBUR <0.7 04/25/2014

## 2014-07-28 NOTE — Assessment & Plan Note (Signed)
Exam consistent with strain, no fractures.  Recent fall.  DEXA ordered

## 2014-07-28 NOTE — Assessment & Plan Note (Addendum)
Well controlled on current regimen. Renal function stable, no changes today.  Lab Results  Component Value Date   CREATININE 0.81 07/26/2014   Lab Results  Component Value Date   NA 137 07/26/2014   K 4.4 07/26/2014   CL 103 07/26/2014   CO2 27 07/26/2014   .

## 2014-07-29 ENCOUNTER — Encounter: Payer: Self-pay | Admitting: Internal Medicine

## 2014-08-03 ENCOUNTER — Telehealth: Payer: Self-pay | Admitting: Internal Medicine

## 2014-08-03 NOTE — Telephone Encounter (Signed)
Pt wanted to know the status of dexa scan scheduling/msn

## 2014-08-10 ENCOUNTER — Other Ambulatory Visit: Payer: Self-pay | Admitting: Internal Medicine

## 2014-08-15 ENCOUNTER — Ambulatory Visit
Admission: RE | Admit: 2014-08-15 | Discharge: 2014-08-15 | Disposition: A | Discharge status: Home / Self Care | Payer: Medicare Other | Source: Ambulatory Visit | Attending: Internal Medicine | Admitting: Internal Medicine

## 2014-08-15 DIAGNOSIS — Z1231 Encounter for screening mammogram for malignant neoplasm of breast: Secondary | ICD-10-CM | POA: Insufficient documentation

## 2014-08-15 DIAGNOSIS — Z1239 Encounter for other screening for malignant neoplasm of breast: Secondary | ICD-10-CM

## 2014-08-15 DIAGNOSIS — M8580 Other specified disorders of bone density and structure, unspecified site: Secondary | ICD-10-CM | POA: Insufficient documentation

## 2014-08-15 DIAGNOSIS — M545 Low back pain, unspecified: Secondary | ICD-10-CM

## 2014-08-15 DIAGNOSIS — M85851 Other specified disorders of bone density and structure, right thigh: Secondary | ICD-10-CM | POA: Diagnosis not present

## 2014-08-15 HISTORY — DX: Malignant (primary) neoplasm, unspecified: C80.1

## 2014-08-19 ENCOUNTER — Encounter: Payer: Self-pay | Admitting: Internal Medicine

## 2014-08-19 DIAGNOSIS — M8580 Other specified disorders of bone density and structure, unspecified site: Secondary | ICD-10-CM | POA: Insufficient documentation

## 2014-08-19 LAB — HM DEXA SCAN

## 2014-08-24 ENCOUNTER — Encounter: Payer: Self-pay | Admitting: Internal Medicine

## 2014-08-24 NOTE — Telephone Encounter (Signed)
Sent my chart message to patient and faxed for results.

## 2014-08-27 ENCOUNTER — Telehealth: Payer: Self-pay | Admitting: Internal Medicine

## 2014-08-27 NOTE — Telephone Encounter (Signed)
Patient ask for previous dexa results to requested I have requested and received place copy for MD in yellow results patient wish for MD to examine.

## 2014-08-28 LAB — HM MAMMOGRAPHY: HM Mammogram: NORMAL

## 2014-08-28 NOTE — Telephone Encounter (Signed)
Message sent via mychart:  Brenda Floyd,  Thanks for  The e mail links on [facility]! We are fascinated and  contemplating a fall family.    You have moderate osteopenia by recent DEXA , Your , T Scores have decreased in the hips by 0.4 points bilaterally ( to -1.9 on the right and -1.6 on the left).  Given the progression I would consider starting weekly alendronate , risedronate,  Or daily Evista, but  Along with continuing daily weight bearing exercise, 1200 mg calcium through diet /supplements and 1000 units Vit D daily. We should repeat DEXA in 2 years .

## 2014-08-29 NOTE — Telephone Encounter (Signed)
FYI

## 2014-10-09 ENCOUNTER — Other Ambulatory Visit: Payer: Self-pay | Admitting: Internal Medicine

## 2014-10-19 ENCOUNTER — Other Ambulatory Visit: Payer: Self-pay | Admitting: Internal Medicine

## 2014-10-29 ENCOUNTER — Ambulatory Visit (INDEPENDENT_AMBULATORY_CARE_PROVIDER_SITE_OTHER): Payer: Medicare Other | Admitting: Internal Medicine

## 2014-10-29 ENCOUNTER — Encounter: Payer: Self-pay | Admitting: Internal Medicine

## 2014-10-29 VITALS — BP 128/80 | HR 84 | Temp 97.6°F | Resp 12 | Ht 65.0 in | Wt 254.5 lb

## 2014-10-29 DIAGNOSIS — E034 Atrophy of thyroid (acquired): Secondary | ICD-10-CM

## 2014-10-29 DIAGNOSIS — E785 Hyperlipidemia, unspecified: Secondary | ICD-10-CM | POA: Diagnosis not present

## 2014-10-29 DIAGNOSIS — M8580 Other specified disorders of bone density and structure, unspecified site: Secondary | ICD-10-CM

## 2014-10-29 DIAGNOSIS — E119 Type 2 diabetes mellitus without complications: Secondary | ICD-10-CM

## 2014-10-29 DIAGNOSIS — I10 Essential (primary) hypertension: Secondary | ICD-10-CM | POA: Diagnosis not present

## 2014-10-29 DIAGNOSIS — E1169 Type 2 diabetes mellitus with other specified complication: Secondary | ICD-10-CM

## 2014-10-29 DIAGNOSIS — E038 Other specified hypothyroidism: Secondary | ICD-10-CM | POA: Diagnosis not present

## 2014-10-29 DIAGNOSIS — E669 Obesity, unspecified: Secondary | ICD-10-CM

## 2014-10-29 LAB — COMPREHENSIVE METABOLIC PANEL
ALBUMIN: 4.1 g/dL (ref 3.5–5.2)
ALT: 29 U/L (ref 0–35)
AST: 22 U/L (ref 0–37)
Alkaline Phosphatase: 60 U/L (ref 39–117)
BUN: 20 mg/dL (ref 6–23)
CHLORIDE: 99 meq/L (ref 96–112)
CO2: 27 mEq/L (ref 19–32)
Calcium: 9.9 mg/dL (ref 8.4–10.5)
Creatinine, Ser: 0.85 mg/dL (ref 0.40–1.20)
GFR: 69.07 mL/min (ref >60.00)
Glucose, Bld: 104 mg/dL — ABNORMAL HIGH (ref 70–99)
POTASSIUM: 4.4 meq/L (ref 3.5–5.1)
Sodium: 137 mEq/L (ref 135–145)
Total Bilirubin: 0.5 mg/dL (ref 0.2–1.2)
Total Protein: 7 g/dL (ref 6.0–8.3)

## 2014-10-29 LAB — LIPID PANEL
CHOLESTEROL: 265 mg/dL — AB (ref 0–200)
HDL: 54.9 mg/dL (ref >39.00)
NonHDL: 210.55
TRIGLYCERIDES: 304 mg/dL — AB (ref 0.0–149.0)
Total CHOL/HDL Ratio: 5
VLDL: 60.8 mg/dL — ABNORMAL HIGH (ref 0.0–40.0)

## 2014-10-29 LAB — HM DIABETES FOOT EXAM: HM Diabetic Foot Exam: NORMAL

## 2014-10-29 LAB — TSH: TSH: 2.04 u[IU]/mL (ref 0.35–4.50)

## 2014-10-29 LAB — MICROALBUMIN / CREATININE URINE RATIO
Creatinine,U: 90.8 mg/dL
Microalb Creat Ratio: 0.9 mg/g (ref 0.0–30.0)
Microalb, Ur: 0.8 mg/dL (ref 0.0–1.9)

## 2014-10-29 LAB — HEMOGLOBIN A1C: HEMOGLOBIN A1C: 5.7 % (ref 4.6–6.5)

## 2014-10-29 LAB — LDL CHOLESTEROL, DIRECT: Direct LDL: 186 mg/dL

## 2014-10-29 MED ORDER — RALOXIFENE HCL 60 MG PO TABS: 60.0000 mg | ORAL_TABLET | Freq: Every day | ORAL | Status: DC

## 2014-10-29 NOTE — Patient Instructions (Addendum)
Raloxifene tablets What is this medicine? RALOXIFENE (ral OX i feen) reduces the amount of calcium lost from bones. It is used to treat and prevent osteoporosis in women who have experienced menopause. This medicine may be used for other purposes; ask your health care provider or pharmacist if you have questions. COMMON BRAND NAME(S): Evista What should I tell my health care provider before I take this medicine? They need to know if you have any of these conditions: -a history of blood clots -cancer -heart failure -liver disease -premenopausal -an unusual or allergic reaction to raloxifene, other medicines, foods, dyes, or preservatives -pregnant or trying to get pregnant -breast-feeding How should I use this medicine? Take this medicine by mouth with a glass of water. Follow the directions on the prescription label. The tablets can be taken with or without food. Take your doses at regular intervals. Do not take your medicine more often than directed. Talk to your pediatrician regarding the use of this medicine in children. Special care may be needed. Overdosage: If you think you have taken too much of this medicine contact a poison control center or emergency room at once. NOTE: This medicine is only for you. Do not share this medicine with others. What if I miss a dose? If you miss a dose, take it as soon as you can. If it is almost time for your next dose, take only that dose. Do not take double or extra doses. What may interact with this medicine? -ampicillin -cholestyramine -colestipol -diazepam -diazoxide -female hormones like hormone replacement therapy -lidocaine -warfarin This list may not describe all possible interactions. Give your health care provider a list of all the medicines, herbs, non-prescription drugs, or dietary supplements you use. Also tell them if you smoke, drink alcohol, or use illegal drugs. Some items may interact with your medicine. What should I watch  for while using this medicine? Visit your doctor or health care professional for regular checks on your progress. Do not stop taking this medicine except on the advice of your doctor or health care professional. You should make sure you get enough calcium and vitamin D in your diet while you are taking this medicine. Discuss your dietary needs with your health care professional or nutritionist. Exercise may help to prevent bone loss. Discuss your exercise needs with your doctor or health care professional. This medicine can rarely cause blood clots. You should avoid long periods of bed rest while taking this medicine. If you are going to have surgery, tell your doctor or health care professional that you are taking this medicine. This medicine should be stopped at least 3 days before surgery. After surgery, it should be restarted only after you are walking again. It should not be restarted while you still need long periods of bed rest. You should not smoke while taking this medicine. Smoking may also increase your risk of blood clots. Smoking can also decrease the effects of this medicine. This medicine does not prevent hot flashes. It may cause hot flashes in some patients at the start of therapy. What side effects may I notice from receiving this medicine? Side effects that you should report to your doctor or health care professional as soon as possible: -change in vision -chest pain -difficulty breathing -leg pain or swelling -skin rash, itching Side effects that usually do not require medical attention (report to your doctor or health care professional if they continue or are bothersome): -fluid build-up -leg cramps -stomach pain -sweating This list may not   describe all possible side effects. Call your doctor for medical advice about side effects. You may report side effects to FDA at 1-800-FDA-1088. Where should I keep my medicine? Keep out of the reach of children. Store at room  temperature between 15 and 30 degrees C (59 and 86 degrees F). Throw away any unused medicine after the expiration date. NOTE: This sheet is a summary. It may not cover all possible information. If you have questions about this medicine, talk to your doctor, pharmacist, or health care provider.  2015, [employer]. (2008-01-12 15:15:14)  

## 2014-10-29 NOTE — Progress Notes (Signed)
sis  Subjective:  Patient ID: Brenda Floyd, female    DOB: 25-Dec-1940  Age: 74 y.o. MRN: [id number]  CC: The primary encounter diagnosis was Diabetes mellitus without complication. Diagnoses of Hypothyroidism due to acquired atrophy of thyroid, Hyperlipidemia with target LDL less than 70, Diabetes mellitus type 2 in obese, Osteopenia determined by x-ray, Obesity (BMI 30-39.9), and Essential hypertension were also pertinent to this visit.  HPI Brenda Floyd presents for follow up on Type 2 DM, obesity , hypertension and hyperlipidemia.  She feels generally well, is not exercising and checking blood sugars once daily at variable times.  BS have been under 130 fasting and < 150 post prandially.  Denies any recent hypoglyemic events.  Taking her medications as directed. Not using 26 units of lantus daily. UAppetite is good.    Her sister was recently diagnosed with BRCA from abnormal 3d mammogram  SOME LEFT SHOULDER PAIN MANAGED BY GRAHAM CHIROPRACTIC WITH DRY NEEDLE TECHNIQUE   Outpatient Prescriptions Prior to Visit  Medication Sig Dispense Refill  . acetaminophen (TYLENOL) 325 MG tablet Take 650 mg by mouth as needed.      Marland Kitchen amoxicillin (AMOXIL) 500 MG capsule Take 4 capsules (2,000 mg total) by mouth once. Take 2000 mg one hour before dental procedures. 4 capsule 1  . aspirin 81 MG tablet Take 1 tablet (81 mg total) by mouth daily. 30 tablet 11  . Cholecalciferol (VITAMIN D3) 5000 UNITS CAPS Take 1 tablet by mouth daily.      Marland Kitchen co-enzyme Q-10 30 MG capsule Take 1 capsule (30 mg total) by mouth daily. 90 capsule 3  . cyanocobalamin (,VITAMIN B-12,) 1000 MCG/ML injection Inject 1 ml daily x 4, then weekly x 3,  Then monthly 10 mL 2  . fexofenadine (ALLEGRA) 180 MG tablet Take 180 mg by mouth daily.    Marland Kitchen glucose blood (FREESTYLE LITE) test strip Use to check blood sugars once daily,  Twice if needed   DM 250.00 102 each 3  . Insulin Glargine (LANTUS SOLOSTAR) 100 UNIT/ML Solostar Pen  INJECT 26 UNITS UNDER THE SKIN DAILY    . levothyroxine (SYNTHROID, LEVOTHROID) 75 MCG tablet TAKE 1 TABLET DAILY FOR HYPOTHYROIDISM 90 tablet 1  . lisinopril (PRINIVIL,ZESTRIL) 20 MG tablet TAKE ONE TABLET BY MOUTH ONCE DAILY 90 tablet 1  . magnesium gluconate (MAGONATE) 500 MG tablet Take 500 mg by mouth daily.    . meloxicam (MOBIC) 15 MG tablet TAKE ONE TABLET BY MOUTH ONCE DAILY 90 tablet 1  . metFORMIN (GLUCOPHAGE-XR) 750 MG 24 hr tablet TAKE 1 TABLET THREE TIMES A DAY 270 tablet 1  . Misc Natural Products (CYSTEX) LIQD Take 15 mLs by mouth daily.    Marland Kitchen PARoxetine (PAXIL) 20 MG tablet TAKE 1 TABLET DAILY 90 tablet 1  . Syringe, Disposable, 1 ML MISC For use with b12 serum 25 each 2  . Syringe/Needle, Disp, (SYRINGE 3CC/25GX1") 25G X 1" 3 ML MISC Use as directed 50 each 0  . traMADol (ULTRAM) 50 MG tablet TAKE ONE TABLET BY MOUTH 4 TIMES DAILY AS NEEDED FOR KNEE PAIN 120 tablet 3  . TURMERIC PO Take by mouth daily.    Marland Kitchen atorvastatin (LIPITOR) 10 MG tablet Take 1 tablet (10 mg total) by mouth daily. (Patient not taking: Reported on 07/26/2014) 90 tablet 3  . diphenhydrAMINE (BENADRYL) 25 mg capsule Take 25 mg by mouth as needed.      . Red Yeast Rice 600 MG CAPS Take 1 capsule by mouth  2 (two) times daily.     No facility-administered medications prior to visit.    Review of Systems;  Patient denies headache, fevers, malaise, unintentional weight loss, skin rash, eye pain, sinus congestion and sinus pain, sore throat, dysphagia,  hemoptysis , cough, dyspnea, wheezing, chest pain, palpitations, orthopnea, edema, abdominal pain, nausea, melena, diarrhea, constipation, flank pain, dysuria, hematuria, urinary  Frequency, nocturia, numbness, tingling, seizures,  Focal weakness, Loss of consciousness,  Tremor, insomnia, depression, anxiety, and suicidal ideation.      Objective:  BP 128/80 mmHg  Pulse 84  Temp(Src) 97.6 F (36.4 C) (Oral)  Resp 12  Ht '5\' 5"'  (1.651 m)  Wt 254 lb 8 oz  (115.44 kg)  BMI 42.35 kg/m2  SpO2 98%  BP Readings from Last 3 Encounters:  10/29/14 128/80  07/26/14 126/80  04/25/14 120/78    Wt Readings from Last 3 Encounters:  10/29/14 254 lb 8 oz (115.44 kg)  07/26/14 255 lb 8 oz (115.894 kg)  04/25/14 248 lb 12 oz (112.832 kg)    General appearance: alert, cooperative and appears stated age Back: symmetric, no curvature. ROM normal. No CVA tenderness. Lungs: clear to auscultation bilaterally Heart: regular rate and rhythm, S1, S2 normal, no murmur, click, rub or gallop Abdomen: soft, non-tender; bowel sounds normal; no masses,  no organomegaly Pulses: 2+ and symmetric Skin: Skin color, texture, turgor normal. No rashes or lesions Lymph nodes: Cervical, supraclavicular, and axillary nodes normal. Neuro: CNs 2-12 intact. DTRs 2+/4 in biceps, brachioradialis, patellars and achilles. Muscle strength 5/5 in upper and lower exremities. Fine resting tremor bilaterally both hands cerebellar function normal. Romberg negative.  No pronator drift.   Gait normal.    Lab Results  Component Value Date   HGBA1C 5.7 10/29/2014   HGBA1C 5.8 07/26/2014   HGBA1C 5.9 04/25/2014    Lab Results  Component Value Date   CREATININE 0.85 10/29/2014   CREATININE 0.81 07/26/2014   CREATININE 0.71 04/25/2014    Lab Results  Component Value Date   WBC 5.3 08/08/2012   HGB 13.5 08/08/2012   HCT 41.2 08/08/2012   PLT 216.0 08/08/2012   GLUCOSE 104* 10/29/2014   CHOL 265* 10/29/2014   TRIG 304.0* 10/29/2014   HDL 54.90 10/29/2014   LDLDIRECT 186.0 10/29/2014   LDLCALC 167* 05/26/2013   ALT 29 10/29/2014   AST 22 10/29/2014   NA 137 10/29/2014   K 4.4 10/29/2014   CL 99 10/29/2014   CREATININE 0.85 10/29/2014   BUN 20 10/29/2014   CO2 27 10/29/2014   TSH 2.04 10/29/2014   HGBA1C 5.7 10/29/2014   MICROALBUR 0.8 10/29/2014    Dg Bone Density  08/15/2014   EXAM: DUAL X-RAY ABSORPTIOMETRY (DXA) FOR BONE MINERAL DENSITY  IMPRESSION: Dear Dr  Deborra Medina, Your patient Brenda Floyd completed a BMD test on 08/15/2014 using the [place] (analysis version: 14.10) manufactured by [employer]. The following summarizes the results of our evaluation. PATIENT BIOGRAPHICAL: Name: BICH, MCHANEY Patient ID: [id number] Birth Date: 01-07-1941 Height: 64.0 in. Gender: Female Exam Date: 08/15/2014 Weight: 252.6 lbs. Indications: Advanced Age, Caucasian, Family History of Fracture, Family Hx of Osteoporosis, Height Loss, History of Fracture (Adult), Hysterectomy, Osteoarthritis, POSTmenopausal Fractures: Spine, Tib/Fib Treatments: 81 MG ASPIRIN, multivitamin, synthroid, Vitamin D  ASSESSMENT: The BMD measured at Femur Neck Right is 0.733 g/cm2 with a T-score of -2.2. This patient is considered osteopenic according to [facility]) criteria.  Site Region Measured Measured WHO Young Adult BMD  Date       Age      Classification T-score DualFemur Neck Right 08/15/2014 [age] Osteopenia -2.2 0.733 g/cm2  AP Spine L2-L4 08/15/2014 76.0 Osteopenia -1.9 0.981 g/cm2  World Health Organization [facility]) criteria for post-menopausal, Caucasian Women: Normal:       T-score at or above -1 SD Osteopenia:   T-score between -1 and -2.5 SD Osteoporosis: T-score at or below -2.5 SD L1 was excluded due to  compression fracture. RECOMMENDATIONS:  National Osteoporosis Foundation recommends that FDA-approved medical therapies be considered in postemenopausal women and men age 70 or older with a:  1. Hip or vertebral (clinical or morphometric) fracture. 2. T-score of < -2.5at the spine or hip. 3. Ten-year fracture probability by FRAX of 3% or greater for hip fracture or 20% or greater for major osteoporotic fracture.  All treatment decisions require clinical judgment and consideration of individual patient factors, including patient preferences, co-morbidities, previous drug use, risk factors not captured in the FRAX model (e.g. falls, vitamin D  deficiency, increased bone turnover, interval significant decline in bone density) and possible under - or over-estimation of fracture risk by FRAX.  All patients should ensure an adequate intake of dietary calcium (1200 mg/d) and vitamin D (800 IU daily) unless contraindicated. FOLLOW-UP: People with diagnosed cases of osteoporosis or at high risk for fracture should have regular bone mineral density tests. For patients eligible for Medicare, routine testing is allowed once every 2 years. The testing frequency can be increased to one year for patients who have rapidly progressing disease, those who are receiving or discontinuing medical therapy to restore bone mass, or have additional risk factors. I have reviewed this report, and agree with the above findings.  [facility] Radiology Dear Dr Deborra Medina,  Your patient JANI MORONTA completed a FRAX assessment on 08/15/2014 using the [place] (analysis version: 14.10) manufactured by [employer]. The following summarizes the results of our evaluation.  PATIENT BIOGRAPHICAL: Name: OPALINE, REYBURN Patient ID: [id number] Birth Date: 1940-11-12 Height:    64.0 in. Gender:     Female    [age]       Weight:    252.6 lbs. Ethnicity:  White                            Exam Date: 08/15/2014  FRAX* RESULTS:  (version: 3.5) 10-year Probability of Fracture1 Major Osteoporotic Fracture2 Hip Fracture 19.1% 4.7% Population: [place] (Caucasian) Risk Factors: History of Fracture (Adult)  Based on Femur (Right) Neck BMD  1 -The 10-year probability of fracture may be lower than reported if the patient has received treatment. 2 -Major Osteoporotic Fracture: Clinical Spine, Forearm, Hip or Shoulder  *FRAX is a Materials engineer of the [employer] for Metabolic Bone Disease, a [place] (WHO) [facility].  ASSESSMENT: The probability of a major osteoporotic fracture is 19.1 %within the next ten  years.  The probability of a hip fracture is 4.7 %within the next ten years.   Electronically Signed   By: David  Martinique M.D.   On: 08/15/2014 10:59   Mm Digital Screening Bilateral  08/15/2014   CLINICAL DATA:  Screening.  EXAM: DIGITAL SCREENING BILATERAL MAMMOGRAM WITH CAD  COMPARISON:  Previous exam(s).  ACR Breast Density Category c: The breast tissue is heterogeneously dense, which may obscure small masses.  FINDINGS: There are no findings suspicious for  malignancy. Images were processed with CAD.  IMPRESSION: No mammographic evidence of malignancy. A result letter of this screening mammogram will be mailed directly to the patient.  RECOMMENDATION: Screening mammogram in one year. (Code:SM-B-01Y)  BI-RADS CATEGORY  1: Negative.   Electronically Signed   By: Lovey Newcomer M.D.   On: 08/15/2014 11:10    Assessment & Plan:   Problem List Items Addressed This Visit      Unprioritized   Diabetes mellitus type 2 in obese     well-controlled on dose of Lantus .  hemoglobin A1c has been consistently at or  less than 6.0 . Patient is up-to-date on eye exams and foot exam is normal today. Patient ihas no evidence of  microalbuminuria Patient is tolerating aspirin and red yeast rice  for CAD risk reduction and is already on on ACE/ARB .  Lab Results  Component Value Date   HGBA1C 5.7 10/29/2014   Lab Results  Component Value Date   MICROALBUR 0.8 10/29/2014              Essential hypertension    Well controlled on current regimen. Renal function stable, no changes today.  Lab Results  Component Value Date   NA 137 10/29/2014   K 4.4 10/29/2014   CL 99 10/29/2014   CO2 27 10/29/2014   Lab Results  Component Value Date   CREATININE 0.85 10/29/2014         Obesity (BMI 30-39.9)    I have addressed  BMI and recommended a low glycemic index diet utilizing smaller more frequent meals to increase metabolism.  I have also recommended that patient start exercising with a goal of 30  minutes of aerobic exercise a minimum of 5 days per week.       Hyperlipidemia with target LDL less than 70    She is tolerating current statin therapy.   Liver enzymes are normal , no changes today.  Lab Results  Component Value Date   CHOL 265* 10/29/2014   HDL 54.90 10/29/2014   LDLCALC 167* 05/26/2013   LDLDIRECT 186.0 10/29/2014   TRIG 304.0* 10/29/2014   CHOLHDL 5 10/29/2014   Lab Results  Component Value Date   ALT 29 10/29/2014   AST 22 10/29/2014   ALKPHOS 60 10/29/2014   BILITOT 0.5 10/29/2014           Osteopenia determined by x-ray    Treatment options discussed i.  Evista chose given concurrent FH of BRCA       Hypothyroidism due to acquired atrophy of thyroid   Relevant Orders   TSH    Other Visit Diagnoses    Diabetes mellitus without complication    -  Primary    Relevant Orders    Comprehensive metabolic panel (Completed)    Hemoglobin A1c (Completed)    LDL cholesterol, direct (Completed)    Lipid panel (Completed)    Microalbumin / creatinine urine ratio (Completed)       I am having Ms. Umphlett start on raloxifene. I am also having her maintain her Vitamin D3, diphenhydrAMINE, acetaminophen, aspirin, glucose blood, CYSTEX, Insulin Glargine, Red Yeast Rice, atorvastatin, co-enzyme Q-10, TURMERIC PO, cyanocobalamin, Syringe (Disposable), SYRINGE 3CC/25GX1", traMADol, amoxicillin, fexofenadine, magnesium gluconate, levothyroxine, meloxicam, lisinopril, metFORMIN, and PARoxetine.  Meds ordered this encounter  Medications  . raloxifene (EVISTA) 60 MG tablet    Sig: Take 1 tablet (60 mg total) by mouth daily.    Dispense:  90 tablet    Refill:  1    There are no discontinued medications.  Follow-up: Return in about 3 months (around 01/28/2015) for WELLNESS TOO 45 MIN, follow up diabetes.   Crecencio Mc, MD

## 2014-10-30 ENCOUNTER — Encounter: Payer: Self-pay | Admitting: Internal Medicine

## 2014-10-31 NOTE — Assessment & Plan Note (Signed)
I have addressed  BMI and recommended a low glycemic index diet utilizing smaller more frequent meals to increase metabolism.  I have also recommended that patient start exercising with a goal of 30 minutes of aerobic exercise a minimum of 5 days per week.  

## 2014-10-31 NOTE — Assessment & Plan Note (Signed)
well-controlled on dose of Lantus .  hemoglobin A1c has been consistently at or  less than 6.0 . Patient is up-to-date on eye exams and foot exam is normal today. Patient ihas no evidence of  microalbuminuria Patient is tolerating aspirin and red yeast rice  for CAD risk reduction and is already on on ACE/ARB .  Lab Results  Component Value Date   HGBA1C 5.7 10/29/2014   Lab Results  Component Value Date   MICROALBUR 0.8 10/29/2014

## 2014-10-31 NOTE — Assessment & Plan Note (Addendum)
Treatment options discussed i.  Evista chose given concurrent FH of BRCA

## 2014-10-31 NOTE — Assessment & Plan Note (Signed)
She is tolerating current statin therapy.   Liver enzymes are normal , no changes today.  Lab Results  Component Value Date   CHOL 265* 10/29/2014   HDL 54.90 10/29/2014   LDLCALC 167* 05/26/2013   LDLDIRECT 186.0 10/29/2014   TRIG 304.0* 10/29/2014   CHOLHDL 5 10/29/2014   Lab Results  Component Value Date   ALT 29 10/29/2014   AST 22 10/29/2014   ALKPHOS 60 10/29/2014   BILITOT 0.5 10/29/2014

## 2014-10-31 NOTE — Assessment & Plan Note (Signed)
Well controlled on current regimen. Renal function stable, no changes today.  Lab Results  Component Value Date   NA 137 10/29/2014   K 4.4 10/29/2014   CL 99 10/29/2014   CO2 27 10/29/2014   Lab Results  Component Value Date   CREATININE 0.85 10/29/2014

## 2014-12-03 ENCOUNTER — Other Ambulatory Visit: Payer: Self-pay | Admitting: Internal Medicine

## 2014-12-06 ENCOUNTER — Other Ambulatory Visit: Payer: Self-pay | Admitting: Internal Medicine

## 2014-12-11 ENCOUNTER — Encounter: Payer: Self-pay | Admitting: Internal Medicine

## 2014-12-25 LAB — HM DIABETES EYE EXAM

## 2015-01-07 ENCOUNTER — Other Ambulatory Visit: Payer: Self-pay | Admitting: Internal Medicine

## 2015-01-09 NOTE — Telephone Encounter (Signed)
Ok to refill,  Refill sent  

## 2015-01-15 ENCOUNTER — Other Ambulatory Visit: Payer: Self-pay | Admitting: Internal Medicine

## 2015-01-29 ENCOUNTER — Ambulatory Visit (INDEPENDENT_AMBULATORY_CARE_PROVIDER_SITE_OTHER): Payer: Medicare Other | Admitting: Internal Medicine

## 2015-01-29 ENCOUNTER — Encounter: Payer: Self-pay | Admitting: Internal Medicine

## 2015-01-29 VITALS — BP 120/82 | HR 77 | Temp 97.8°F | Resp 18 | Ht 65.0 in | Wt 255.1 lb

## 2015-01-29 DIAGNOSIS — E785 Hyperlipidemia, unspecified: Secondary | ICD-10-CM

## 2015-01-29 DIAGNOSIS — E669 Obesity, unspecified: Secondary | ICD-10-CM

## 2015-01-29 DIAGNOSIS — E119 Type 2 diabetes mellitus without complications: Secondary | ICD-10-CM | POA: Diagnosis not present

## 2015-01-29 DIAGNOSIS — Z0000 Encounter for general adult medical examination without abnormal findings: Secondary | ICD-10-CM | POA: Diagnosis not present

## 2015-01-29 DIAGNOSIS — E1169 Type 2 diabetes mellitus with other specified complication: Secondary | ICD-10-CM

## 2015-01-29 LAB — COMPREHENSIVE METABOLIC PANEL
ALT: 32 U/L (ref 0–35)
AST: 24 U/L (ref 0–37)
Albumin: 4.1 g/dL (ref 3.5–5.2)
Alkaline Phosphatase: 49 U/L (ref 39–117)
BUN: 21 mg/dL (ref 6–23)
CHLORIDE: 103 meq/L (ref 96–112)
CO2: 29 mEq/L (ref 19–32)
CREATININE: 0.86 mg/dL (ref 0.40–1.20)
Calcium: 9.9 mg/dL (ref 8.4–10.5)
GFR: 68.1 mL/min (ref >60.00)
GLUCOSE: 87 mg/dL (ref 70–99)
Potassium: 4.8 mEq/L (ref 3.5–5.1)
SODIUM: 140 meq/L (ref 135–145)
Total Bilirubin: 0.4 mg/dL (ref 0.2–1.2)
Total Protein: 6.8 g/dL (ref 6.0–8.3)

## 2015-01-29 LAB — LIPID PANEL
Cholesterol: 243 mg/dL — ABNORMAL HIGH (ref 0–200)
HDL: 57.1 mg/dL (ref >39.00)
NonHDL: 185.98
Total CHOL/HDL Ratio: 4
Triglycerides: 223 mg/dL — ABNORMAL HIGH (ref 0.0–149.0)
VLDL: 44.6 mg/dL — ABNORMAL HIGH (ref 0.0–40.0)

## 2015-01-29 LAB — LDL CHOLESTEROL, DIRECT: Direct LDL: 151 mg/dL

## 2015-01-29 LAB — HEMOGLOBIN A1C: Hgb A1c MFr Bld: 5.6 % (ref 4.6–6.5)

## 2015-01-29 MED ORDER — TRAMADOL HCL 50 MG PO TABS: ORAL_TABLET | ORAL | Status: DC

## 2015-01-29 NOTE — Patient Instructions (Addendum)
Increase your tramadol to up to 4 times daily for back pain .  Talk to [place] about delivering your feed to avoid ack injury  Menopause is a normal process in which your reproductive ability comes to an end. This process happens gradually over a span of months to years, usually between the ages of 59 and 23. Menopause is complete when you have missed 12 consecutive menstrual periods. It is important to talk with your health care provider about some of the most common conditions that affect postmenopausal women, such as heart disease, cancer, and bone loss (osteoporosis). Adopting a healthy lifestyle and getting preventive care can help to promote your health and wellness. Those actions can also lower your chances of developing some of these common conditions. WHAT SHOULD I KNOW ABOUT MENOPAUSE? During menopause, you may experience a number of symptoms, such as:  Moderate-to-severe hot flashes.  Night sweats.  Decrease in sex drive.  Mood swings.  Headaches.  Tiredness.  Irritability.  Memory problems.  Insomnia. Choosing to treat or not to treat menopausal changes is an individual decision that you make with your health care provider. WHAT SHOULD I KNOW ABOUT HORMONE REPLACEMENT THERAPY AND SUPPLEMENTS? Hormone therapy products are effective for treating symptoms that are associated with menopause, such as hot flashes and night sweats. Hormone replacement carries certain risks, especially as you become older. If you are thinking about using estrogen or estrogen with progestin treatments, discuss the benefits and risks with your health care provider. WHAT SHOULD I KNOW ABOUT HEART DISEASE AND STROKE? Heart disease, heart attack, and stroke become more likely as you age. This may be due, in part, to the hormonal changes that your body experiences during menopause. These can affect how your body processes dietary fats, triglycerides, and cholesterol. Heart attack and stroke are both  medical emergencies. There are many things that you can do to help prevent heart disease and stroke:  Have your blood pressure checked at least every 1-2 years. High blood pressure causes heart disease and increases the risk of stroke.  If you are 19-13 years old, ask your health care provider if you should take aspirin to prevent a heart attack or a stroke.  Do not use any tobacco products, including cigarettes, chewing tobacco, or electronic cigarettes. If you need help quitting, ask your health care provider.  It is important to eat a healthy diet and maintain a healthy weight.  Be sure to include plenty of vegetables, fruits, low-fat dairy products, and lean protein.  Avoid eating foods that are high in solid fats, added sugars, or salt (sodium).  Get regular exercise. This is one of the most important things that you can do for your health.  Try to exercise for at least 150 minutes each week. The type of exercise that you do should increase your heart rate and make you sweat. This is known as moderate-intensity exercise.  Try to do strengthening exercises at least twice each week. Do these in addition to the moderate-intensity exercise.  Know your numbers.Ask your health care provider to check your cholesterol and your blood glucose. Continue to have your blood tested as directed by your health care provider. WHAT SHOULD I KNOW ABOUT CANCER SCREENING? There are several types of cancer. Take the following steps to reduce your risk and to catch any cancer development as early as possible. Breast Cancer  Practice breast self-awareness.  This means understanding how your breasts normally appear and feel.  It also means doing regular breast  self-exams. Let your health care provider know about any changes, no matter how small.  If you are 63 or older, have a clinician do a breast exam (clinical breast exam or CBE) every year. Depending on your age, family history, and medical history,  it may be recommended that you also have a yearly breast X-ray (mammogram).  If you have a family history of breast cancer, talk with your health care provider about genetic screening.  If you are at high risk for breast cancer, talk with your health care provider about having an MRI and a mammogram every year.  Breast cancer (BRCA) gene test is recommended for women who have family members with BRCA-related cancers. Results of the assessment will determine the need for genetic counseling and BRCA1 and for BRCA2 testing. BRCA-related cancers include these types:  Breast. This occurs in males or females.  Ovarian.  Tubal. This may also be called fallopian tube cancer.  Cancer of the abdominal or pelvic lining (peritoneal cancer).  Prostate.  Pancreatic. Cervical, Uterine, and Ovarian Cancer Your health care provider may recommend that you be screened regularly for cancer of the pelvic organs. These include your ovaries, uterus, and vagina. This screening involves a pelvic exam, which includes checking for microscopic changes to the surface of your cervix (Pap test).  For women ages 21-65, health care providers may recommend a pelvic exam and a Pap test every three years. For women ages 43-65, they may recommend the Pap test and pelvic exam, combined with testing for human papilloma virus (HPV), every five years. Some types of HPV increase your risk of cervical cancer. Testing for HPV may also be done on women of any age who have unclear Pap test results.  Other health care providers may not recommend any screening for nonpregnant women who are considered low risk for pelvic cancer and have no symptoms. Ask your health care provider if a screening pelvic exam is right for you.  If you have had past treatment for cervical cancer or a condition that could lead to cancer, you need Pap tests and screening for cancer for at least 20 years after your treatment. If Pap tests have been discontinued  for you, your risk factors (such as having a new sexual partner) need to be reassessed to determine if you should start having screenings again. Some women have medical problems that increase the chance of getting cervical cancer. In these cases, your health care provider may recommend that you have screening and Pap tests more often.  If you have a family history of uterine cancer or ovarian cancer, talk with your health care provider about genetic screening.  If you have vaginal bleeding after reaching menopause, tell your health care provider.  There are currently no reliable tests available to screen for ovarian cancer. Lung Cancer Lung cancer screening is recommended for adults 62-32 years old who are at high risk for lung cancer because of a history of smoking. A yearly low-dose CT scan of the lungs is recommended if you:  Currently smoke.  Have a history of at least 30 pack-years of smoking and you currently smoke or have quit within the past 15 years. A pack-year is smoking an average of one pack of cigarettes per day for one year. Yearly screening should:  Continue until it has been 15 years since you quit.  Stop if you develop a health problem that would prevent you from having lung cancer treatment. Colorectal Cancer  This type of cancer can  be detected and can often be prevented.  Routine colorectal cancer screening usually begins at age 35 and continues through age 16.  If you have risk factors for colon cancer, your health care provider may recommend that you be screened at an earlier age.  If you have a family history of colorectal cancer, talk with your health care provider about genetic screening.  Your health care provider may also recommend using home test kits to check for hidden blood in your stool.  A small camera at the end of a tube can be used to examine your colon directly (sigmoidoscopy or colonoscopy). This is done to check for the earliest forms of  colorectal cancer.  Direct examination of the colon should be repeated every 5-10 years until age 67. However, if early forms of precancerous polyps or small growths are found or if you have a family history or genetic risk for colorectal cancer, you may need to be screened more often. Skin Cancer  Check your skin from head to toe regularly.  Monitor any moles. Be sure to tell your health care provider:  About any new moles or changes in moles, especially if there is a change in a mole's shape or color.  If you have a mole that is larger than the size of a pencil eraser.  If any of your family members has a history of skin cancer, especially at a young age, talk with your health care provider about genetic screening.  Always use sunscreen. Apply sunscreen liberally and repeatedly throughout the day.  Whenever you are outside, protect yourself by wearing long sleeves, pants, a wide-brimmed hat, and sunglasses. WHAT SHOULD I KNOW ABOUT OSTEOPOROSIS? Osteoporosis is a condition in which bone destruction happens more quickly than new bone creation. After menopause, you may be at an increased risk for osteoporosis. To help prevent osteoporosis or the bone fractures that can happen because of osteoporosis, the following is recommended:  If you are 85-74 years old, get at least 1,000 mg of calcium and at least 600 mg of vitamin D per day.  If you are older than age 45 but younger than age 64, get at least 1,200 mg of calcium and at least 600 mg of vitamin D per day.  If you are older than age 97, get at least 1,200 mg of calcium and at least 800 mg of vitamin D per day. Smoking and excessive alcohol intake increase the risk of osteoporosis. Eat foods that are rich in calcium and vitamin D, and do weight-bearing exercises several times each week as directed by your health care provider. WHAT SHOULD I KNOW ABOUT HOW MENOPAUSE AFFECTS [place]? Depression may occur at any age, but it is  more common as you become older. Common symptoms of depression include:  Low or sad mood.  Changes in sleep patterns.  Changes in appetite or eating patterns.  Feeling an overall lack of motivation or enjoyment of activities that you previously enjoyed.  Frequent crying spells. Talk with your health care provider if you think that you are experiencing depression. WHAT SHOULD I KNOW ABOUT IMMUNIZATIONS? It is important that you get and maintain your immunizations. These include:  Tetanus, diphtheria, and pertussis (Tdap) booster vaccine.  Influenza every year before the flu season begins.  Pneumonia vaccine.  Shingles vaccine. Your health care provider may also recommend other immunizations.   This information is not intended to replace advice given to you by your health care provider. Make sure you discuss  any questions you have with your health care provider.   Document Released: 03/20/2005 Document Revised: 02/16/2014 Document Reviewed: 09/28/2013 Elsevier Interactive Patient Education [employer].

## 2015-01-29 NOTE — Assessment & Plan Note (Addendum)
Managed with red yeast rice . Based on current lipid profile, the risk of clinically significant CAD is 22% over the next 10 years, using the Framingham risk calculator. The [employer] recommends starting patients aged 76 or higher on moderate intensity statin therapy for LDL between 70-189 and 10 yr risk of CAD > 7.5% ;  and high intensity therapy for anyone with LDL > 190. . Does not want to resume lipitor due to concern about potential side effects .  Taking an aspirin.   Lab Results  Component Value Date   CHOL 243* 01/29/2015   HDL 57.10 01/29/2015   LDLCALC 167* 05/26/2013   LDLDIRECT 151.0 01/29/2015   TRIG 223.0* 01/29/2015   CHOLHDL 4 01/29/2015

## 2015-01-29 NOTE — Progress Notes (Signed)
Patient ID: Brenda Floyd, female    DOB: 1940-04-16  Age: 74 y.o. MRN: [id number]  The patient is here for annual Medicare wellness examination and management of other chronic and acute problems.   Has living will,  Sister has POA DEXA and mammogram done in July  . Next colonoscopy due Oct 2017  [place],  [place]   The risk factors are reflected in the social history.  The roster of all physicians providing medical care to patient - is listed in the Snapshot section of the chart.  Activities of daily living:  The patient is 100% independent in all ADLs: dressing, toileting, feeding as well as independent mobility  Home safety : The patient has smoke detectors in the home. They wear seatbelts.  There are no firearms at home. There is no violence in the home.   There is no risks for hepatitis, STDs or HIV. There is no   history of blood transfusion. They have no travel history to infectious disease endemic areas of the world.  The patient has seen their dentist in the last six month. They have seen their eye doctor in the last year. They admit to slight hearing difficulty with regard to whispered voices and some television programs.  They have deferred audiologic testing in the last year.  They do not  have excessive sun exposure. Discussed the need for sun protection: hats, long sleeves and use of sunscreen if there is significant sun exposure.   Diet: the importance of a healthy diet is discussed. They do have a healthy diet.  The benefits of regular aerobic exercise were discussed. She walks 4 times per week ,  20 minutes.   Depression screen: there are no signs or vegative symptoms of depression- irritability, change in appetite, anhedonia, sadness/tearfullness.  Cognitive assessment: the patient manages all their financial and personal affairs and is actively engaged. They could relate day,date,year and events; recalled 2/3 objects at 3 minutes; performed clock-face test  normally.  The following portions of the patient's history were reviewed and updated as appropriate: allergies, current medications, past family history, past medical history,  past surgical history, past social history  and problem list.  Visual acuity was not assessed per patient preference since she has regular follow up with her ophthalmologist. Hearing and body mass index were assessed and reviewed.     diabetic eye exam done Nov 2015 at [facility],  caratact left eye,  No surgery yet,  No retinopathy.  During the course of the visit the patient was educated and counseled about appropriate screening and preventive services including : fall prevention , diabetes screening, nutrition counseling, colorectal cancer screening, and recommended immunizations.    CC: The primary encounter diagnosis was Diabetes mellitus without complication ([place]). Diagnoses of Hyperlipidemia with target LDL less than 70, Diabetes mellitus type 2 in obese ([place]), Obesity (BMI 30-39.9), and Encounter for preventive health examination were also pertinent to this visit.  Had a tooth pulled recently  Taking 1000 IUS Vit D  Using 26 units Lantus.  Low GI diet .  No lows   Left shoulder pain since fall in yard, could not get up without assistance, right knee felt dislocated patella.  Chiropractor has been massaging deltoid  With improvement but concerned about a rotatotr tear.  Discussed left shoulder MRI     Back pain aggravated by lifting 50 lb sacks of farm animal and dog food.  Dry needling by [facility] chiropractric helps a little  History Nadea has  a past medical history of Colon polyps (2012); Diverticulosis of sigmoid colon (July 2012); Fracture closed, femur, shaft ([place]) (remote); Diabetes mellitus; Hypertension; Thyroid disease; and Cancer ([place]).   She has past surgical history that includes Joint replacement; Abdominal hysterectomy; and Breast cyst aspiration (Left).   Her family history includes Breast  cancer in her cousin and maternal aunt; Breast cancer (age of onset: 8) in her mother; Cancer in her father and mother; Diabetes in her sister; Hypertension in her sister.She reports that she quit smoking about 24 years ago. She has never used smokeless tobacco. She reports that she does not drink alcohol or use illicit drugs.  Outpatient Prescriptions Prior to Visit  Medication Sig Dispense Refill  . acetaminophen (TYLENOL) 325 MG tablet Take 650 mg by mouth as needed.      Marland Kitchen amoxicillin (AMOXIL) 500 MG capsule TAKE FOUR CAPSULES BY MOUTH ONCE DAILY. TAKE 2000 MG ONE HOUR BEFORE DENTAL PROCEDURES. 4 capsule 0  . aspirin 81 MG tablet Take 1 tablet (81 mg total) by mouth daily. 30 tablet 11  . atorvastatin (LIPITOR) 10 MG tablet Take 1 tablet (10 mg total) by mouth daily. 90 tablet 3  . Cholecalciferol (VITAMIN D3) 5000 UNITS CAPS Take 1 tablet by mouth daily.      Marland Kitchen co-enzyme Q-10 30 MG capsule Take 1 capsule (30 mg total) by mouth daily. 90 capsule 3  . cyanocobalamin (,VITAMIN B-12,) 1000 MCG/ML injection Inject 1 ml daily x 4, then weekly x 3,  Then monthly 10 mL 2  . diphenhydrAMINE (BENADRYL) 25 mg capsule Take 25 mg by mouth as needed.      . fexofenadine (ALLEGRA) 180 MG tablet Take 180 mg by mouth daily.    Marland Kitchen glucose blood (FREESTYLE LITE) test strip Use to check blood sugars once daily,  Twice if needed   DM 250.00 102 each 3  . Insulin Glargine (LANTUS SOLOSTAR) 100 UNIT/ML Solostar Pen INJECT 26 UNITS UNDER THE SKIN DAILY    . levothyroxine (SYNTHROID, LEVOTHROID) 75 MCG tablet TAKE 1 TABLET DAILY FOR HYPOTHYROIDISM 90 tablet 2  . lisinopril (PRINIVIL,ZESTRIL) 20 MG tablet TAKE ONE TABLET BY MOUTH ONCE DAILY 90 tablet 1  . magnesium gluconate (MAGONATE) 500 MG tablet Take 500 mg by mouth daily.    . meloxicam (MOBIC) 15 MG tablet TAKE ONE TABLET BY MOUTH ONCE DAILY 90 tablet 0  . metFORMIN (GLUCOPHAGE-XR) 750 MG 24 hr tablet TAKE 1 TABLET THREE TIMES A DAY 270 tablet 1  . Misc Natural  Products (CYSTEX) LIQD Take 15 mLs by mouth daily.    Marland Kitchen PARoxetine (PAXIL) 20 MG tablet TAKE 1 TABLET DAILY 90 tablet 1  . raloxifene (EVISTA) 60 MG tablet Take 1 tablet (60 mg total) by mouth daily. 90 tablet 1  . Red Yeast Rice 600 MG CAPS Take 1 capsule by mouth 2 (two) times daily.    . Syringe, Disposable, 1 ML MISC For use with b12 serum 25 each 2  . Syringe/Needle, Disp, (SYRINGE 3CC/25GX1") 25G X 1" 3 ML MISC Use as directed 50 each 0  . TURMERIC PO Take by mouth daily.    . traMADol (ULTRAM) 50 MG tablet TAKE ONE TABLET BY MOUTH 4 TIMES DAILY AS NEEDED FOR KNEE PAIN 120 tablet 3   No facility-administered medications prior to visit.    Review of Systems  Objective:  BP 120/82 mmHg  Pulse 77  Temp(Src) 97.8 F (36.6 C) (Oral)  Resp 18  Ht 5\' 5"  (1.651 m)  Wt 255 lb 2 oz (115.724 kg)  BMI 42.46 kg/m2  SpO2 95%  Physical Exam    Assessment & Plan:   Problem List Items Addressed This Visit    Diabetes mellitus type 2 in obese ([place])     well-controlled on 26 units of Lantus .  hemoglobin A1c has been consistently at or  less than 6.0 . Patient is up-to-date on eye exams and foot exam is normal today. Patient ihas no evidence of  microalbuminuria Patient is tolerating aspirin and red yeast rice  for CAD risk reduction and is already on on ACE/ARB .  Lab Results  Component Value Date   HGBA1C 5.6 01/29/2015   Lab Results  Component Value Date   MICROALBUR 0.8 10/29/2014                Obesity (BMI 30-39.9)    I have addressed  BMI and recommended a low glycemic index diet utilizing smaller more frequent meals to increase metabolism.  I have also recommended that patient start exercising with a goal of 30 minutes of aerobic exercise a minimum of 5 days per week.         Hyperlipidemia with target LDL less than 70    Managed with red yeast rice . Based on current lipid profile, the risk of clinically significant CAD is 22% over the next 10 years, using the  [place] risk calculator. The [employer] recommends starting patients aged 45 or higher on moderate intensity statin therapy for LDL between 70-189 and 10 yr risk of CAD > 7.5% ;  and high intensity therapy for anyone with LDL > 190. . Does not want to resume lipitor due to concern about potential side effects .  Taking an aspirin.   Lab Results  Component Value Date   CHOL 243* 01/29/2015   HDL 57.10 01/29/2015   LDLCALC 167* 05/26/2013   LDLDIRECT 151.0 01/29/2015   TRIG 223.0* 01/29/2015   CHOLHDL 4 01/29/2015         Encounter for preventive health examination    Annual comprehensive preventive exam was done as well as an evaluation and management of chronic conditions .  During the course of the visit the patient was educated and counseled about appropriate screening and preventive services including :  diabetes screening, lipid analysis with projected  10 year  risk for CAD, , nutrition counseling, colorectal cancer screening, and recommended immunizations.  Printed recommendations for health maintenance screenings was given.        Other Visit Diagnoses    Diabetes mellitus without complication ([place])    -  Primary    Relevant Orders    Hemoglobin A1c (Completed)    LDL cholesterol, direct (Completed)    Lipid panel (Completed)    Comprehensive metabolic panel (Completed)       I am having Ms. Brar maintain her Vitamin D3, diphenhydrAMINE, acetaminophen, aspirin, glucose blood, CYSTEX, Insulin Glargine, Red Yeast Rice, atorvastatin, co-enzyme Q-10, TURMERIC PO, cyanocobalamin, Syringe (Disposable), SYRINGE 3CC/25GX1", fexofenadine, magnesium gluconate, metFORMIN, PARoxetine, raloxifene, levothyroxine, meloxicam, amoxicillin, lisinopril, and traMADol.  Meds ordered this encounter  Medications  . traMADol (ULTRAM) 50 MG tablet    Sig: TAKE ONE TABLET BY MOUTH 4 TIMES DAILY AS NEEDED FOR KNEE PAIN    Dispense:  120 tablet    Refill:  5    Medications  Discontinued During This Encounter  Medication Reason  . traMADol (ULTRAM) 50 MG tablet Reorder    Follow-up: Return in  about 3 months (around 04/29/2015) for follow up diabetes.   Crecencio Mc, MD

## 2015-01-29 NOTE — Progress Notes (Signed)
Pre-visit discussion using our clinic review tool. No additional management support is needed unless otherwise documented below in the visit note.  

## 2015-01-31 DIAGNOSIS — Z0000 Encounter for general adult medical examination without abnormal findings: Secondary | ICD-10-CM | POA: Insufficient documentation

## 2015-01-31 NOTE — Assessment & Plan Note (Signed)
I have addressed  BMI and recommended a low glycemic index diet utilizing smaller more frequent meals to increase metabolism.  I have also recommended that patient start exercising with a goal of 30 minutes of aerobic exercise a minimum of 5 days per week.  

## 2015-01-31 NOTE — Assessment & Plan Note (Signed)
well-controlled on 26 units of Lantus .  hemoglobin A1c has been consistently at or  less than 6.0 . Patient is up-to-date on eye exams and foot exam is normal today. Patient ihas no evidence of  microalbuminuria Patient is tolerating aspirin and red yeast rice  for CAD risk reduction and is already on on ACE/ARB .  Lab Results  Component Value Date   HGBA1C 5.6 01/29/2015   Lab Results  Component Value Date   MICROALBUR 0.8 10/29/2014

## 2015-01-31 NOTE — Assessment & Plan Note (Signed)
Annual comprehensive preventive exam was done as well as an evaluation and management of chronic conditions .  During the course of the visit the patient was educated and counseled about appropriate screening and preventive services including :  diabetes screening, lipid analysis with projected  10 year  risk for CAD  , , nutrition counseling, colorectal cancer screening, and recommended immunizations.  Printed recommendations for health maintenance screenings was given.   

## 2015-02-03 ENCOUNTER — Encounter: Payer: Self-pay | Admitting: Internal Medicine

## 2015-03-17 ENCOUNTER — Other Ambulatory Visit: Payer: Self-pay | Admitting: Internal Medicine

## 2015-03-18 NOTE — Telephone Encounter (Signed)
Please advise.  Ok to fill? 

## 2015-03-18 NOTE — Telephone Encounter (Signed)
Ok to refill,  Refill sent  

## 2015-03-21 ENCOUNTER — Other Ambulatory Visit: Payer: Self-pay | Admitting: Internal Medicine

## 2015-03-27 ENCOUNTER — Other Ambulatory Visit: Payer: Self-pay | Admitting: Internal Medicine

## 2015-03-27 DIAGNOSIS — E538 Deficiency of other specified B group vitamins: Secondary | ICD-10-CM

## 2015-03-27 NOTE — Telephone Encounter (Signed)
Patient is wanting a refill for medication for her monthly injection. Please advise?

## 2015-03-28 MED ORDER — CYANOCOBALAMIN 1000 MCG/ML IJ SOLN: INTRAMUSCULAR | Status: DC

## 2015-03-28 NOTE — Telephone Encounter (Signed)
Refill sent.

## 2015-04-07 ENCOUNTER — Other Ambulatory Visit: Payer: Self-pay | Admitting: Internal Medicine

## 2015-04-29 ENCOUNTER — Encounter: Payer: Self-pay | Admitting: Internal Medicine

## 2015-04-29 ENCOUNTER — Ambulatory Visit (INDEPENDENT_AMBULATORY_CARE_PROVIDER_SITE_OTHER): Payer: Medicare Other | Admitting: Internal Medicine

## 2015-04-29 VITALS — BP 120/80 | HR 84 | Temp 97.4°F | Resp 12 | Ht 65.0 in | Wt 250.4 lb

## 2015-04-29 DIAGNOSIS — E669 Obesity, unspecified: Secondary | ICD-10-CM | POA: Diagnosis not present

## 2015-04-29 DIAGNOSIS — E1169 Type 2 diabetes mellitus with other specified complication: Secondary | ICD-10-CM

## 2015-04-29 DIAGNOSIS — M25579 Pain in unspecified ankle and joints of unspecified foot: Secondary | ICD-10-CM | POA: Diagnosis not present

## 2015-04-29 DIAGNOSIS — T50905A Adverse effect of unspecified drugs, medicaments and biological substances, initial encounter: Secondary | ICD-10-CM

## 2015-04-29 DIAGNOSIS — E119 Type 2 diabetes mellitus without complications: Secondary | ICD-10-CM

## 2015-04-29 DIAGNOSIS — E785 Hyperlipidemia, unspecified: Secondary | ICD-10-CM | POA: Diagnosis not present

## 2015-04-29 DIAGNOSIS — E538 Deficiency of other specified B group vitamins: Secondary | ICD-10-CM

## 2015-04-29 DIAGNOSIS — M791 Myalgia, unspecified site: Secondary | ICD-10-CM

## 2015-04-29 DIAGNOSIS — E038 Other specified hypothyroidism: Secondary | ICD-10-CM

## 2015-04-29 DIAGNOSIS — E034 Atrophy of thyroid (acquired): Secondary | ICD-10-CM

## 2015-04-29 DIAGNOSIS — I10 Essential (primary) hypertension: Secondary | ICD-10-CM

## 2015-04-29 DIAGNOSIS — T887XXA Unspecified adverse effect of drug or medicament, initial encounter: Secondary | ICD-10-CM

## 2015-04-29 DIAGNOSIS — E559 Vitamin D deficiency, unspecified: Secondary | ICD-10-CM

## 2015-04-29 MED ORDER — MONTELUKAST SODIUM 10 MG PO TABS: 10.0000 mg | ORAL_TABLET | Freq: Every day | ORAL | Status: DC

## 2015-04-29 MED ORDER — CYANOCOBALAMIN 1000 MCG/ML IJ SOLN: INTRAMUSCULAR | Status: DC

## 2015-04-29 NOTE — Progress Notes (Signed)
Pre-visit discussion using our clinic review tool. No additional management support is needed unless otherwise documented below in the visit note.  

## 2015-04-29 NOTE — Patient Instructions (Signed)
Suspend the Evista.  If the myalgias resolve,  We will start the PA for Prolia  Return after tomorrow for your fasting labs   Your mammogram is due in July,  colonoscopy is apparently  overdue   Thank you for the eggs!!!

## 2015-04-29 NOTE — Progress Notes (Signed)
Subjective:  Patient ID: Brenda Floyd, female    DOB: 20-Feb-1940  Age: 75 y.o. MRN: [id number]  CC: The primary encounter diagnosis was Diabetes mellitus type 2 in obese ([place]). Diagnoses of Hyperlipidemia with target LDL less than 70, Vitamin B12 deficiency (non anemic), Pain in joint, ankle and foot, unspecified laterality, Vitamin D deficiency, Myalgia, Essential hypertension, Medication adverse effect, initial encounter, and Hypothyroidism due to acquired atrophy of thyroid were also pertinent to this visit.  HPI KAMORI FEESER presents for follow up on type 2 DM ,  Obesity,  Hypertension    She feels generally well, but is not exercising due to joint pain . checking blood sugars once daily at variable times.  BS have been under 130 fasting and < 150 post prandially.  Denies any recent hypoglyemic events.  Taking her medications as directed. Following a carbohydrate modified diet 6 days per week. Denies numbness, burning and tingling of extremities. Appetite is good.     Hs lost 5 lbs since last visit , using 22 units of Lantus, daily.   One low sugar caused by overindulgence at Christmas   Has been having Myalgias,  Left shoulder hurts to  sleep on  Triceps  muscle hurts same arm and both hips,  knees and ankles.  Has gotten worse since starting Evista. And allergic rhinitis    Taking a probiotic. With lactobacillus. (available on [place] )  Having a BM  Once  Daily  No  diarrhea       Allergies acting up despite taking maximal  dose of Allegra  Foot exam done,  Flat feet.  Eye exam no retinopathy right catraract  thas progressed slightly,  Can correct  20/45   Lab Results  Component Value Date   HGBA1C 5.6 01/29/2015     Outpatient Prescriptions Prior to Visit  Medication Sig Dispense Refill  . acetaminophen (TYLENOL) 325 MG tablet Take 650 mg by mouth as needed.      Marland Kitchen amoxicillin (AMOXIL) 500 MG capsule TAKE FOUR CAPSULES BY MOUTH ONE HOUR BEFORE APPOINTMENT 4  capsule 0  . aspirin 81 MG tablet Take 1 tablet (81 mg total) by mouth daily. 30 tablet 11  . Cholecalciferol (VITAMIN D3) 5000 UNITS CAPS Take 1 tablet by mouth daily.      Marland Kitchen co-enzyme Q-10 30 MG capsule Take 1 capsule (30 mg total) by mouth daily. 90 capsule 3  . fexofenadine (ALLEGRA) 180 MG tablet Take 180 mg by mouth daily.    Marland Kitchen glucose blood (FREESTYLE LITE) test strip Use to check blood sugars once daily,  Twice if needed   DM 250.00 102 each 3  . Insulin Glargine (LANTUS SOLOSTAR) 100 UNIT/ML Solostar Pen INJECT 22 UNITS UNDER THE SKIN DAILY    . levothyroxine (SYNTHROID, LEVOTHROID) 75 MCG tablet TAKE 1 TABLET DAILY FOR HYPOTHYROIDISM 90 tablet 2  . lisinopril (PRINIVIL,ZESTRIL) 20 MG tablet TAKE ONE TABLET BY MOUTH ONCE DAILY 90 tablet 1  . magnesium gluconate (MAGONATE) 500 MG tablet Take 500 mg by mouth daily.    . meloxicam (MOBIC) 15 MG tablet TAKE ONE TABLET BY MOUTH ONCE DAILY 90 tablet 0  . metFORMIN (GLUCOPHAGE-XR) 750 MG 24 hr tablet TAKE 1 TABLET THREE TIMES A DAY 270 tablet 0  . PARoxetine (PAXIL) 20 MG tablet TAKE 1 TABLET DAILY 90 tablet 1  . raloxifene (EVISTA) 60 MG tablet TAKE 1 TABLET DAILY 90 tablet 1  . Syringe, Disposable, 1 ML MISC For use with b12  serum 25 each 2  . Syringe/Needle, Disp, (SYRINGE 3CC/25GX1") 25G X 1" 3 ML MISC Use as directed 50 each 0  . traMADol (ULTRAM) 50 MG tablet TAKE ONE TABLET BY MOUTH 4 TIMES DAILY AS NEEDED FOR KNEE PAIN 120 tablet 5  . TURMERIC PO Take by mouth daily.    . cyanocobalamin (,VITAMIN B-12,) 1000 MCG/ML injection monthly 10 mL 2  . Misc Natural Products (CYSTEX) LIQD Take 15 mLs by mouth daily. Reported on 04/29/2015    . atorvastatin (LIPITOR) 10 MG tablet Take 1 tablet (10 mg total) by mouth daily. (Patient not taking: Reported on 04/29/2015) 90 tablet 3  . diphenhydrAMINE (BENADRYL) 25 mg capsule Take 25 mg by mouth as needed. Reported on 04/29/2015    . Red Yeast Rice 600 MG CAPS Take 1 capsule by mouth 2 (two) times  daily. Reported on 04/29/2015     No facility-administered medications prior to visit.    Review of Systems;  Patient denies headache, fevers, malaise, unintentional weight loss, skin rash, eye pain, sinus congestion and sinus pain, sore throat, dysphagia,  hemoptysis , cough, dyspnea, wheezing, chest pain, palpitations, orthopnea, edema, abdominal pain, nausea, melena, diarrhea, constipation, flank pain, dysuria, hematuria, urinary  Frequency, nocturia, numbness, tingling, seizures,  Focal weakness, Loss of consciousness,  Tremor, insomnia, depression, anxiety, and suicidal ideation.      Objective:  BP 120/80 mmHg  Pulse 84  Temp(Src) 97.4 F (36.3 C) (Oral)  Resp 12  Ht 5\' 5"  (1.651 m)  Wt 250 lb 6 oz (113.569 kg)  BMI 41.66 kg/m2  SpO2 97%  BP Readings from Last 3 Encounters:  04/29/15 120/80  01/29/15 120/82  10/29/14 128/80    Wt Readings from Last 3 Encounters:  04/29/15 250 lb 6 oz (113.569 kg)  01/29/15 255 lb 2 oz (115.724 kg)  10/29/14 254 lb 8 oz (115.44 kg)    General appearance: alert, cooperative and appears stated age Ears: normal TM's and external ear canals both ears Throat: lips, mucosa, and tongue normal; teeth and gums normal Neck: no adenopathy, no carotid bruit, supple, symmetrical, trachea midline and thyroid not enlarged, symmetric, no tenderness/mass/nodules Back: symmetric, no curvature. ROM normal. No CVA tenderness. Lungs: clear to auscultation bilaterally Heart: regular rate and rhythm, S1, S2 normal, no murmur, click, rub or gallop Abdomen: soft, non-tender; bowel sounds normal; no masses,  no organomegaly Pulses: 2+ and symmetric Skin: Skin color, texture, turgor normal. No rashes or lesions Lymph nodes: Cervical, supraclavicular, and axillary nodes normal.  Lab Results  Component Value Date   HGBA1C 5.6 01/29/2015   HGBA1C 5.7 10/29/2014   HGBA1C 5.8 07/26/2014    Lab Results  Component Value Date   CREATININE 0.86 01/29/2015    CREATININE 0.85 10/29/2014   CREATININE 0.81 07/26/2014    Lab Results  Component Value Date   WBC 5.3 08/08/2012   HGB 13.5 08/08/2012   HCT 41.2 08/08/2012   PLT 216.0 08/08/2012   GLUCOSE 87 01/29/2015   CHOL 243* 01/29/2015   TRIG 223.0* 01/29/2015   HDL 57.10 01/29/2015   LDLDIRECT 151.0 01/29/2015   LDLCALC 167* 05/26/2013   ALT 32 01/29/2015   AST 24 01/29/2015   NA 140 01/29/2015   K 4.8 01/29/2015   CL 103 01/29/2015   CREATININE 0.86 01/29/2015   BUN 21 01/29/2015   CO2 29 01/29/2015   TSH 2.04 10/29/2014   HGBA1C 5.6 01/29/2015   MICROALBUR 0.8 10/29/2014    Dg Bone Density  08/15/2014  EXAM: DUAL X-RAY ABSORPTIOMETRY (DXA) FOR BONE MINERAL DENSITY IMPRESSION: Dear Dr Deborra Medina, Your patient MONTEEN MELIS completed a BMD test on 08/15/2014 using the [place] (analysis version: 14.10) manufactured by [employer]. The following summarizes the results of our evaluation. PATIENT BIOGRAPHICAL: Name: DANNYELLE, GROVES Patient ID: [id number] Birth Date: Oct 05, 1940 Height: 64.0 in. Gender: Female Exam Date: 08/15/2014 Weight: 252.6 lbs. Indications: Advanced Age, Caucasian, Family History of Fracture, Family Hx of Osteoporosis, Height Loss, History of Fracture (Adult), Hysterectomy, Osteoarthritis, POSTmenopausal Fractures: Spine, Tib/Fib Treatments: 81 MG ASPIRIN, multivitamin, synthroid, Vitamin D ASSESSMENT: The BMD measured at Femur Neck Right is 0.733 g/cm2 with a T-score of -2.2. This patient is considered osteopenic according to [facility]) criteria. Site Region Measured Measured WHO Young Adult BMD Date       Age      Classification T-score DualFemur Neck Right 08/15/2014 76.0 Osteopenia -2.2 0.733 g/cm2 AP Spine L2-L4 08/15/2014 76.0 Osteopenia -1.9 0.981 g/cm2 World Health Organization [facility]) criteria for post-menopausal, Caucasian Women: Normal:       T-score at or above -1 SD Osteopenia:   T-score between -1 and -2.5 SD  Osteoporosis: T-score at or below -2.5 SD L1 was excluded due to  compression fracture. RECOMMENDATIONS: National Osteoporosis Foundation recommends that FDA-approved medical therapies be considered in postemenopausal women and men age 7 or older with a: 1. Hip or vertebral (clinical or morphometric) fracture. 2. T-score of < -2.5at the spine or hip. 3. Ten-year fracture probability by FRAX of 3% or greater for hip fracture or 20% or greater for major osteoporotic fracture. All treatment decisions require clinical judgment and consideration of individual patient factors, including patient preferences, co-morbidities, previous drug use, risk factors not captured in the FRAX model (e.g. falls, vitamin D deficiency, increased bone turnover, interval significant decline in bone density) and possible under - or over-estimation of fracture risk by FRAX. All patients should ensure an adequate intake of dietary calcium (1200 mg/d) and vitamin D (800 IU daily) unless contraindicated. FOLLOW-UP: People with diagnosed cases of osteoporosis or at high risk for fracture should have regular bone mineral density tests. For patients eligible for Medicare, routine testing is allowed once every 2 years. The testing frequency can be increased to one year for patients who have rapidly progressing disease, those who are receiving or discontinuing medical therapy to restore bone mass, or have additional risk factors. I have reviewed this report, and agree with the above findings. [facility]iology Dear Dr Deborra Medina, Your patient MALAAK EBELING completed a FRAX assessment on 08/15/2014 using the [place] (analysis version: 14.10) manufactured by [employer]. The following summarizes the results of our evaluation. PATIENT BIOGRAPHICAL: Name: HINATA, HORACE Patient ID: [id number] Birth Date: 09-30-1940 Height:    64.0 in. Gender:     Female    [age]       Weight:    252.6 lbs. Ethnicity:   White                            Exam Date: 08/15/2014 FRAX* RESULTS:  (version: 3.5) 10-year Probability of Fracture1 Major Osteoporotic Fracture2 Hip Fracture 19.1% 4.7% Population: [place] (Caucasian) Risk Factors: History of Fracture (Adult) Based on Femur (Right) Neck BMD 1 -The 10-year probability of fracture may be lower than reported if the patient has received treatment. 2 -Major Osteoporotic Fracture: Clinical Spine, Forearm, Hip  or Shoulder *FRAX is a Materials engineer of the [employer] for Metabolic Bone Disease, a [place] (WHO) [facility]. ASSESSMENT: The probability of a major osteoporotic fracture is 19.1 %within the next ten years. The probability of a hip fracture is 4.7 %within the next ten years. Electronically Signed   By: David  Martinique M.D.   On: 08/15/2014 10:59   Mm Digital Screening Bilateral  08/15/2014  CLINICAL DATA:  Screening. EXAM: DIGITAL SCREENING BILATERAL MAMMOGRAM WITH CAD COMPARISON:  Previous exam(s). ACR Breast Density Category c: The breast tissue is heterogeneously dense, which may obscure small masses. FINDINGS: There are no findings suspicious for malignancy. Images were processed with CAD. IMPRESSION: No mammographic evidence of malignancy. A result letter of this screening mammogram will be mailed directly to the patient. RECOMMENDATION: Screening mammogram in one year. (Code:SM-B-01Y) BI-RADS CATEGORY  1: Negative. Electronically Signed   By: Lovey Newcomer M.D.   On: 08/15/2014 11:10    Assessment & Plan:   Problem List Items Addressed This Visit    Diabetes mellitus type 2 in obese ([place]) - Primary     well-controlled on 22 units of Lantus .  hemoglobin A1c has been consistently at or  less than 6.0 . Patient is up-to-date on eye exams and foot exam is normal today except for pes planus. . Patient ihas no evidence of  microalbuminuria Patient is tolerating aspirin and red yeast rice  for CAD risk reduction and is  already on on ACE/ARB .  Lab Results  Component Value Date   HGBA1C 5.6 01/29/2015   Lab Results  Component Value Date   MICROALBUR 0.8 10/29/2014                  Relevant Orders   Comprehensive metabolic panel   Hemoglobin A1c   Lipid panel   Microalbumin / creatinine urine ratio   LDL cholesterol, direct   Essential hypertension    Well controlled on current regimen. Renal function stable, no changes today.  Lab Results  Component Value Date   CREATININE 0.86 01/29/2015   Lab Results  Component Value Date   NA 140 01/29/2015   K 4.8 01/29/2015   CL 103 01/29/2015   CO2 29 01/29/2015         Hypothyroidism due to acquired atrophy of thyroid    Thyroid function is WNL on current dose.  No current changes needed.   Lab Results  Component Value Date   TSH 2.04 10/29/2014          Medication adverse effect    Suspending Evista to see if myalgias resolve.  CK pending       Hyperlipidemia with target LDL less than 70   Relevant Orders   LDL cholesterol, direct   Pain in joint, ankle and foot   Relevant Orders   Sedimentation rate   CK   Vitamin B12 deficiency (non anemic)   Relevant Medications   cyanocobalamin (,VITAMIN B-12,) 1000 MCG/ML injection    Other Visit Diagnoses    Vitamin D deficiency        Relevant Orders    VITAMIN D 25 Hydroxy (Vit-D Deficiency, Fractures)    Myalgia        Relevant Orders    TSH       I have discontinued Ms. Repsher's diphenhydrAMINE, Red Yeast Rice, and atorvastatin. I have also changed her cyanocobalamin. Additionally, I am having her start on montelukast. Lastly, I am having her  maintain her Vitamin D3, acetaminophen, aspirin, glucose blood, CYSTEX, Insulin Glargine, co-enzyme Q-10, TURMERIC PO, Syringe (Disposable), SYRINGE 3CC/25GX1", fexofenadine, magnesium gluconate, PARoxetine, levothyroxine, meloxicam, lisinopril, traMADol, amoxicillin, metFORMIN, and raloxifene.  Meds ordered this encounter    Medications  . cyanocobalamin (,VITAMIN B-12,) 1000 MCG/ML injection    Sig: 1 ml injected monthly    Dispense:  10 mL    Refill:  2    Pharmacy stop giving patient 1 ml bottlles!!!  10 ml ordered  . montelukast (SINGULAIR) 10 MG tablet    Sig: Take 1 tablet (10 mg total) by mouth at bedtime.    Dispense:  30 tablet    Refill:  3    Medications Discontinued During This Encounter  Medication Reason  . atorvastatin (LIPITOR) 10 MG tablet   . Red Yeast Rice 600 MG CAPS   . diphenhydrAMINE (BENADRYL) 25 mg capsule   . cyanocobalamin (,VITAMIN B-12,) 1000 MCG/ML injection Reorder   A total of 25 minutes of face to face time was spent with patient more than half of which was spent in counselling and coordination of care    Follow-up: Return in about 4 months (around 08/29/2015) for follow up diabetes, NOT CPE.   Crecencio Mc, MD

## 2015-04-30 DIAGNOSIS — T50905A Adverse effect of unspecified drugs, medicaments and biological substances, initial encounter: Secondary | ICD-10-CM | POA: Insufficient documentation

## 2015-04-30 NOTE — Assessment & Plan Note (Signed)
Well controlled on current regimen. Renal function stable, no changes today.  Lab Results  Component Value Date   CREATININE 0.86 01/29/2015   Lab Results  Component Value Date   NA 140 01/29/2015   K 4.8 01/29/2015   CL 103 01/29/2015   CO2 29 01/29/2015

## 2015-04-30 NOTE — Assessment & Plan Note (Signed)
Thyroid function is WNL on current dose.  No current changes needed.   Lab Results  Component Value Date   TSH 2.04 10/29/2014

## 2015-04-30 NOTE — Assessment & Plan Note (Signed)
Suspending Evista to see if myalgias resolve.  CK pending

## 2015-04-30 NOTE — Assessment & Plan Note (Signed)
well-controlled on 22 units of Lantus .  hemoglobin A1c has been consistently at or  less than 6.0 . Patient is up-to-date on eye exams and foot exam is normal today except for pes planus. . Patient ihas no evidence of  microalbuminuria Patient is tolerating aspirin and red yeast rice  for CAD risk reduction and is already on on ACE/ARB .  Lab Results  Component Value Date   HGBA1C 5.6 01/29/2015   Lab Results  Component Value Date   MICROALBUR 0.8 10/29/2014

## 2015-05-09 ENCOUNTER — Telehealth: Payer: Self-pay

## 2015-05-09 ENCOUNTER — Other Ambulatory Visit (INDEPENDENT_AMBULATORY_CARE_PROVIDER_SITE_OTHER): Payer: Medicare Other

## 2015-05-09 DIAGNOSIS — E119 Type 2 diabetes mellitus without complications: Secondary | ICD-10-CM | POA: Diagnosis not present

## 2015-05-09 DIAGNOSIS — M791 Myalgia, unspecified site: Secondary | ICD-10-CM

## 2015-05-09 DIAGNOSIS — E559 Vitamin D deficiency, unspecified: Secondary | ICD-10-CM

## 2015-05-09 DIAGNOSIS — M25579 Pain in unspecified ankle and joints of unspecified foot: Secondary | ICD-10-CM | POA: Diagnosis not present

## 2015-05-09 DIAGNOSIS — E785 Hyperlipidemia, unspecified: Secondary | ICD-10-CM

## 2015-05-09 DIAGNOSIS — E669 Obesity, unspecified: Secondary | ICD-10-CM

## 2015-05-09 DIAGNOSIS — E1169 Type 2 diabetes mellitus with other specified complication: Secondary | ICD-10-CM

## 2015-05-09 LAB — TSH: TSH: 2.36 u[IU]/mL (ref 0.35–4.50)

## 2015-05-09 LAB — COMPREHENSIVE METABOLIC PANEL
ALK PHOS: 49 U/L (ref 39–117)
ALT: 27 U/L (ref 0–35)
AST: 22 U/L (ref 0–37)
Albumin: 4.2 g/dL (ref 3.5–5.2)
BILIRUBIN TOTAL: 0.5 mg/dL (ref 0.2–1.2)
BUN: 17 mg/dL (ref 6–23)
CALCIUM: 9.3 mg/dL (ref 8.4–10.5)
CO2: 28 mEq/L (ref 19–32)
CREATININE: 0.78 mg/dL (ref 0.40–1.20)
Chloride: 102 mEq/L (ref 96–112)
GFR: 76.16 mL/min (ref >60.00)
GLUCOSE: 89 mg/dL (ref 70–99)
Potassium: 4.9 mEq/L (ref 3.5–5.1)
Sodium: 140 mEq/L (ref 135–145)
TOTAL PROTEIN: 6.7 g/dL (ref 6.0–8.3)

## 2015-05-09 LAB — CK: Total CK: 42 U/L (ref 7–177)

## 2015-05-09 LAB — MICROALBUMIN / CREATININE URINE RATIO
CREATININE, U: 77.5 mg/dL
MICROALB UR: 0.7 mg/dL (ref 0.0–1.9)
MICROALB/CREAT RATIO: 0.9 mg/g (ref 0.0–30.0)

## 2015-05-09 LAB — LDL CHOLESTEROL, DIRECT: Direct LDL: 150 mg/dL

## 2015-05-09 LAB — VITAMIN D 25 HYDROXY (VIT D DEFICIENCY, FRACTURES): VITD: 159.28 ng/mL — AB (ref 30.00–100.00)

## 2015-05-09 LAB — LIPID PANEL
CHOL/HDL RATIO: 4
Cholesterol: 249 mg/dL — ABNORMAL HIGH (ref 0–200)
HDL: 59.6 mg/dL (ref >39.00)
NONHDL: 189.31
TRIGLYCERIDES: 234 mg/dL — AB (ref 0.0–149.0)
VLDL: 46.8 mg/dL — ABNORMAL HIGH (ref 0.0–40.0)

## 2015-05-09 LAB — SEDIMENTATION RATE: SED RATE: 9 mm/h (ref 0–22)

## 2015-05-09 LAB — HEMOGLOBIN A1C: Hgb A1c MFr Bld: 5.9 % (ref 4.6–6.5)

## 2015-05-09 NOTE — Telephone Encounter (Signed)
Looks like she is taking a Vit D supplement 5000units daily. Please have her stop taking this and any other vit D supplements. Then follow up with Dr. Derrel Nip.

## 2015-05-09 NOTE — Telephone Encounter (Signed)
Hope with [facility]: CRITICAL  Vit D - high 159.28  Forwarding to PCP and other providers due to PCP absence.  Please advise if I can be of assistance.

## 2015-05-09 NOTE — Telephone Encounter (Signed)
Already addressed

## 2015-05-09 NOTE — Telephone Encounter (Signed)
Pt given recommendation and a follow up appt has been scheduled for 05/22/15.

## 2015-05-13 ENCOUNTER — Encounter: Payer: Self-pay | Admitting: Internal Medicine

## 2015-05-22 ENCOUNTER — Encounter: Payer: Self-pay | Admitting: Internal Medicine

## 2015-05-22 ENCOUNTER — Ambulatory Visit (INDEPENDENT_AMBULATORY_CARE_PROVIDER_SITE_OTHER): Payer: Medicare Other | Admitting: Internal Medicine

## 2015-05-22 VITALS — BP 140/80 | HR 91 | Temp 97.7°F | Resp 12 | Ht 65.0 in | Wt 250.0 lb

## 2015-05-22 DIAGNOSIS — R29818 Other symptoms and signs involving the nervous system: Secondary | ICD-10-CM | POA: Diagnosis not present

## 2015-05-22 DIAGNOSIS — R2689 Other abnormalities of gait and mobility: Secondary | ICD-10-CM

## 2015-05-22 DIAGNOSIS — E669 Obesity, unspecified: Secondary | ICD-10-CM | POA: Diagnosis not present

## 2015-05-22 DIAGNOSIS — T452X1A Poisoning by vitamins, accidental (unintentional), initial encounter: Secondary | ICD-10-CM | POA: Diagnosis not present

## 2015-05-22 MED ORDER — INSULIN GLARGINE 100 UNIT/ML SOLOSTAR PEN: PEN_INJECTOR | SUBCUTANEOUS | Status: DC

## 2015-05-22 MED ORDER — MONTELUKAST SODIUM 10 MG PO TABS: 10.0000 mg | ORAL_TABLET | Freq: Every day | ORAL | Status: DC

## 2015-05-22 NOTE — Progress Notes (Signed)
Pre-visit discussion using our clinic review tool. No additional management support is needed unless otherwise documented below in the visit note.  

## 2015-05-22 NOTE — Progress Notes (Signed)
Subjective:  Patient ID: Brenda Floyd, female    DOB: 08/08/1940  Age: 75 y.o. MRN: [id number]  CC: The primary encounter diagnosis was Vitamin D toxicity, accidental or unintentional, initial encounter. Diagnoses of Loss of balance and Obesity (BMI 30-39.9) were also pertinent to this visit.  HPI Brenda Floyd presents for follow up on hypervitaminisis D due to incorrect use of medication.  She had been taking  50,000 units  Daily of Vit D3, which she had purchased on [employer] .   Reviewed the symptoms of Vit d toxicity:  Confusion polyuria, polydipsia  anorexia and muscle weakness?    muscle weakness reported at last visit, which patient had  attributed to Evista  Had an episode of loss of balance after sqatting for a prolonged period of time . Her chiropractor was concerned about her circulation    Outpatient Prescriptions Prior to Visit  Medication Sig Dispense Refill  . acetaminophen (TYLENOL) 325 MG tablet Take 650 mg by mouth as needed.      Marland Kitchen aspirin 81 MG tablet Take 1 tablet (81 mg total) by mouth daily. 30 tablet 11  . co-enzyme Q-10 30 MG capsule Take 1 capsule (30 mg total) by mouth daily. 90 capsule 3  . cyanocobalamin (,VITAMIN B-12,) 1000 MCG/ML injection 1 ml injected monthly 10 mL 2  . fexofenadine (ALLEGRA) 180 MG tablet Take 180 mg by mouth daily.    Marland Kitchen glucose blood (FREESTYLE LITE) test strip Use to check blood sugars once daily,  Twice if needed   DM 250.00 102 each 3  . levothyroxine (SYNTHROID, LEVOTHROID) 75 MCG tablet TAKE 1 TABLET DAILY FOR HYPOTHYROIDISM 90 tablet 2  . lisinopril (PRINIVIL,ZESTRIL) 20 MG tablet TAKE ONE TABLET BY MOUTH ONCE DAILY 90 tablet 1  . magnesium gluconate (MAGONATE) 500 MG tablet Take 500 mg by mouth daily.    . meloxicam (MOBIC) 15 MG tablet TAKE ONE TABLET BY MOUTH ONCE DAILY 90 tablet 0  . metFORMIN (GLUCOPHAGE-XR) 750 MG 24 hr tablet TAKE 1 TABLET THREE TIMES A DAY 270 tablet 0  . PARoxetine (PAXIL) 20 MG tablet TAKE 1  TABLET DAILY 90 tablet 1  . Syringe, Disposable, 1 ML MISC For use with b12 serum 25 each 2  . Syringe/Needle, Disp, (SYRINGE 3CC/25GX1") 25G X 1" 3 ML MISC Use as directed 50 each 0  . traMADol (ULTRAM) 50 MG tablet TAKE ONE TABLET BY MOUTH 4 TIMES DAILY AS NEEDED FOR KNEE PAIN 120 tablet 5  . TURMERIC PO Take by mouth daily.    . Insulin Glargine (LANTUS SOLOSTAR) 100 UNIT/ML Solostar Pen INJECT 22 UNITS UNDER THE SKIN DAILY    . montelukast (SINGULAIR) 10 MG tablet Take 1 tablet (10 mg total) by mouth at bedtime. 30 tablet 3  . amoxicillin (AMOXIL) 500 MG capsule TAKE FOUR CAPSULES BY MOUTH ONE HOUR BEFORE APPOINTMENT (Patient not taking: Reported on 05/22/2015) 4 capsule 0  . Cholecalciferol (VITAMIN D3) 5000 UNITS CAPS Take 1 tablet by mouth daily. Reported on 05/22/2015    . Misc Natural Products (CYSTEX) LIQD Take 15 mLs by mouth daily. Reported on 05/22/2015    . raloxifene (EVISTA) 60 MG tablet TAKE 1 TABLET DAILY (Patient not taking: Reported on 05/22/2015) 90 tablet 1   No facility-administered medications prior to visit.    Review of Systems;  Patient denies headache, fevers, malaise, unintentional weight loss, skin rash, eye pain, sinus congestion and sinus pain, sore throat, dysphagia,  hemoptysis , cough, dyspnea, wheezing, chest  pain, palpitations, orthopnea, edema, abdominal pain, nausea, melena, diarrhea, constipation, flank pain, dysuria, hematuria, urinary  Frequency, nocturia, numbness, tingling, seizures,  Focal weakness, Loss of consciousness,  Tremor, insomnia, depression, anxiety, and suicidal ideation.      Objective:  BP 140/80 mmHg  Pulse 91  Temp(Src) 97.7 F (36.5 C) (Oral)  Resp 12  Ht 5\' 5"  (1.651 m)  Wt 250 lb (113.399 kg)  BMI 41.60 kg/m2  SpO2 97%  BP Readings from Last 3 Encounters:  05/22/15 140/80  04/29/15 120/80  01/29/15 120/82    Wt Readings from Last 3 Encounters:  05/22/15 250 lb (113.399 kg)  04/29/15 250 lb 6 oz (113.569 kg)  01/29/15  255 lb 2 oz (115.724 kg)    General appearance: alert, cooperative and appears stated age Ears: normal TM's and external ear canals both ears Throat: lips, mucosa, and tongue normal; teeth and gums normal Neck: no adenopathy, no carotid bruit, supple, symmetrical, trachea midline and thyroid not enlarged, symmetric, no tenderness/mass/nodules Back: symmetric, no curvature. ROM normal. No CVA tenderness. Lungs: clear to auscultation bilaterally Heart: regular rate and rhythm, S1, S2 normal, no murmur, click, rub or gallop Abdomen: soft, non-tender; bowel sounds normal; no masses,  no organomegaly Pulses: 2+ and symmetric, cap refill  <  3 sec Skin: Skin color, texture, turgor normal. No rashes or lesions Lymph nodes: Cervical, supraclavicular, and axillary nodes normal. MS:  gluts weak,  Cannot stand on one leg.trouble risng fro mseated position hands free Neuro: CNs 2-12 intact. DTRs 2+/4 in biceps, brachioradialis, patellars and achilles. Muscle strength 4+/5 in upper and lower exremities. Fine resting tremor bilaterally both hands cerebellar function normal. Romberg negative.  No pronator drift.   Gait normal. :   Lab Results  Component Value Date   HGBA1C 5.9 05/09/2015   HGBA1C 5.6 01/29/2015   HGBA1C 5.7 10/29/2014    Lab Results  Component Value Date   CREATININE 0.78 05/09/2015   CREATININE 0.86 01/29/2015   CREATININE 0.85 10/29/2014    Lab Results  Component Value Date   WBC 5.3 08/08/2012   HGB 13.5 08/08/2012   HCT 41.2 08/08/2012   PLT 216.0 08/08/2012   GLUCOSE 89 05/09/2015   CHOL 249* 05/09/2015   TRIG 234.0* 05/09/2015   HDL 59.60 05/09/2015   LDLDIRECT 150.0 05/09/2015   LDLCALC 167* 05/26/2013   ALT 27 05/09/2015   AST 22 05/09/2015   NA 140 05/09/2015   K 4.9 05/09/2015   CL 102 05/09/2015   CREATININE 0.78 05/09/2015   BUN 17 05/09/2015   CO2 28 05/09/2015   TSH 2.36 05/09/2015   HGBA1C 5.9 05/09/2015   MICROALBUR 0.7 05/09/2015    Dg Bone  Density  08/15/2014  EXAM: DUAL X-RAY ABSORPTIOMETRY (DXA) FOR BONE MINERAL DENSITY IMPRESSION: Dear Dr Deborra Medina, Your patient Brenda Floyd completed a BMD test on 08/15/2014 using the [place] (analysis version: 14.10) manufactured by [employer]. The following summarizes the results of our evaluation. PATIENT BIOGRAPHICAL: Name: JENISSE, SEIBOLD Patient ID: [id number] Birth Date: 1940-09-28 Height: 64.0 in. Gender: Female Exam Date: 08/15/2014 Weight: 252.6 lbs. Indications: Advanced Age, Caucasian, Family History of Fracture, Family Hx of Osteoporosis, Height Loss, History of Fracture (Adult), Hysterectomy, Osteoarthritis, POSTmenopausal Fractures: Spine, Tib/Fib Treatments: 81 MG ASPIRIN, multivitamin, synthroid, Vitamin D ASSESSMENT: The BMD measured at Femur Neck Right is 0.733 g/cm2 with a T-score of -2.2. This patient is considered osteopenic according to [facility]) criteria. Site Region Measured Measured WHO  Young Adult BMD Date       Age      Classification T-score DualFemur Neck Right 08/15/2014 [age] Osteopenia -2.2 0.733 g/cm2 AP Spine L2-L4 08/15/2014 76.0 Osteopenia -1.9 0.981 g/cm2 World Health Organization [facility]) criteria for post-menopausal, Caucasian Women: Normal:       T-score at or above -1 SD Osteopenia:   T-score between -1 and -2.5 SD Osteoporosis: T-score at or below -2.5 SD L1 was excluded due to  compression fracture. RECOMMENDATIONS: National Osteoporosis Foundation recommends that FDA-approved medical therapies be considered in postemenopausal women and men age 69 or older with a: 1. Hip or vertebral (clinical or morphometric) fracture. 2. T-score of < -2.5at the spine or hip. 3. Ten-year fracture probability by FRAX of 3% or greater for hip fracture or 20% or greater for major osteoporotic fracture. All treatment decisions require clinical judgment and consideration of individual patient factors, including patient preferences,  co-morbidities, previous drug use, risk factors not captured in the FRAX model (e.g. falls, vitamin D deficiency, increased bone turnover, interval significant decline in bone density) and possible under - or over-estimation of fracture risk by FRAX. All patients should ensure an adequate intake of dietary calcium (1200 mg/d) and vitamin D (800 IU daily) unless contraindicated. FOLLOW-UP: People with diagnosed cases of osteoporosis or at high risk for fracture should have regular bone mineral density tests. For patients eligible for Medicare, routine testing is allowed once every 2 years. The testing frequency can be increased to one year for patients who have rapidly progressing disease, those who are receiving or discontinuing medical therapy to restore bone mass, or have additional risk factors. I have reviewed this report, and agree with the above findings. [facility] Dear Dr Deborra Medina, Your patient LATERIA PLAUT completed a FRAX assessment on 08/15/2014 using the [place] (analysis version: 14.10) manufactured by [employer]. The following summarizes the results of our evaluation. PATIENT BIOGRAPHICAL: Name: IZZABELL, LUDOLPH Patient ID: [id number] Birth Date: 11/04/1940 Height:    64.0 in. Gender:     Female    [age]       Weight:    252.6 lbs. Ethnicity:  White                            Exam Date: 08/15/2014 FRAX* RESULTS:  (version: 3.5) 10-year Probability of Fracture1 Major Osteoporotic Fracture2 Hip Fracture 19.1% 4.7% Population: [place] (Caucasian) Risk Factors: History of Fracture (Adult) Based on Femur (Right) Neck BMD 1 -The 10-year probability of fracture may be lower than reported if the patient has received treatment. 2 -Major Osteoporotic Fracture: Clinical Spine, Forearm, Hip or Shoulder *FRAX is a Materials engineer of the [employer] for Metabolic Bone Disease, a [place] (WHO) [facility].  ASSESSMENT: The probability of a major osteoporotic fracture is 19.1 %within the next ten years. The probability of a hip fracture is 4.7 %within the next ten years. Electronically Signed   By: David  Martinique M.D.   On: 08/15/2014 10:59   Mm Digital Screening Bilateral  08/15/2014  CLINICAL DATA:  Screening. EXAM: DIGITAL SCREENING BILATERAL MAMMOGRAM WITH CAD COMPARISON:  Previous exam(s). ACR Breast Density Category c: The breast tissue is heterogeneously dense, which may obscure small masses. FINDINGS: There are no findings suspicious for malignancy. Images were processed with CAD. IMPRESSION: No mammographic evidence of malignancy. A result letter of this screening mammogram  will be mailed directly to the patient. RECOMMENDATION: Screening mammogram in one year. (Code:SM-B-01Y) BI-RADS CATEGORY  1: Negative. Electronically Signed   By: Lovey Newcomer M.D.   On: 08/15/2014 11:10    Assessment & Plan:   Problem List Items Addressed This Visit    Obesity (BMI 30-39.9)    I have addressed  BMI and recommended a low glycemic index diet utilizing smaller more frequent meals to increase metabolism.  I have also recommended that patient start exercising with a goal of 30 minutes of aerobic exercise a minimum of 5 days per week.       Relevant Medications   Insulin Glargine (LANTUS SOLOSTAR) 100 UNIT/ML Solostar Pen   Vitamin D toxicity - Primary    Level still high but lower.  Med suspended at last visit. phosphoru s level as normal      Relevant Orders   VITAMIN D 25 Hydroxy (Vit-D Deficiency, Fractures) (Completed)   Phosphorus (Completed)   Loss of balance    neurologicexam is normal.  Weak glut muscles ,  referral to PT discussed but deferred.       Relevant Orders   Ambulatory referral to Physical Therapy     A total of 25 minutes of face to face time was spent with patient more than half of which was spent in counselling about the above mentioned conditions  and coordination of care  I  have discontinued Ms. Forstner's CYSTEX and raloxifene. I am also having her maintain her Vitamin D3, acetaminophen, aspirin, glucose blood, co-enzyme Q-10, TURMERIC PO, Syringe (Disposable), SYRINGE 3CC/25GX1", fexofenadine, magnesium gluconate, PARoxetine, levothyroxine, meloxicam, lisinopril, traMADol, amoxicillin, metFORMIN, cyanocobalamin, montelukast, and Insulin Glargine.  Meds ordered this encounter  Medications  . montelukast (SINGULAIR) 10 MG tablet    Sig: Take 1 tablet (10 mg total) by mouth at bedtime.    Dispense:  30 tablet    Refill:  3  . Insulin Glargine (LANTUS SOLOSTAR) 100 UNIT/ML Solostar Pen    Sig: INJECT 22 UNITS UNDER THE SKIN DAILY    Dispense:  15 mL    Refill:  6    Medications Discontinued During This Encounter  Medication Reason  . Misc Natural Products (CYSTEX) LIQD Patient Preference  . raloxifene (EVISTA) 60 MG tablet Patient Preference  . montelukast (SINGULAIR) 10 MG tablet Reorder  . Insulin Glargine (LANTUS SOLOSTAR) 100 UNIT/ML Solostar Pen Reorder    Follow-up: No Follow-up on file.   Crecencio Mc, MD

## 2015-05-22 NOTE — Patient Instructions (Signed)
    To make a low carb chip :  Take the [employer] Lav[place] or Pita bread,  Or the [place] Low carb whole wheat tortilla   Place on metal cookie sheet  Brush with olive oil  Sprinkle garlic powder (NOT garlic salt), grated parmesan cheese, mediterranean seasoning , or all of them?  Bake at 225 or 250 for 90 minutes

## 2015-05-23 ENCOUNTER — Encounter: Payer: Self-pay | Admitting: Internal Medicine

## 2015-05-23 ENCOUNTER — Telehealth: Payer: Self-pay

## 2015-05-23 LAB — PHOSPHORUS: Phosphorus: 3.4 mg/dL (ref 2.3–4.6)

## 2015-05-23 LAB — VITAMIN D 25 HYDROXY (VIT D DEFICIENCY, FRACTURES): VITD: 138.35 ng/mL (ref 30.00–100.00)

## 2015-05-23 NOTE — Telephone Encounter (Signed)
STAT Results called to the office, Artis Delay from the Lab  Vitamin D 138.35  Please advise. Thanks

## 2015-05-25 DIAGNOSIS — R2689 Other abnormalities of gait and mobility: Secondary | ICD-10-CM | POA: Insufficient documentation

## 2015-05-25 DIAGNOSIS — T452X1A Poisoning by vitamins, accidental (unintentional), initial encounter: Secondary | ICD-10-CM | POA: Insufficient documentation

## 2015-05-25 NOTE — Assessment & Plan Note (Addendum)
neurologicexam is normal.  Weak glut muscles ,  referral to PT discussed but deferred.

## 2015-05-25 NOTE — Assessment & Plan Note (Signed)
I have addressed  BMI and recommended a low glycemic index diet utilizing smaller more frequent meals to increase metabolism.  I have also recommended that patient start exercising with a goal of 30 minutes of aerobic exercise a minimum of 5 days per week.  

## 2015-05-25 NOTE — Assessment & Plan Note (Signed)
Level still high but lower.  Med suspended at last visit. phosphoru s level as normal

## 2015-05-30 ENCOUNTER — Encounter: Payer: Self-pay | Admitting: Physical Therapy

## 2015-05-30 ENCOUNTER — Ambulatory Visit: Payer: Medicare Other | Attending: Internal Medicine | Admitting: Physical Therapy

## 2015-05-30 DIAGNOSIS — M6281 Muscle weakness (generalized): Secondary | ICD-10-CM | POA: Diagnosis not present

## 2015-05-30 DIAGNOSIS — R2689 Other abnormalities of gait and mobility: Secondary | ICD-10-CM | POA: Diagnosis not present

## 2015-05-30 DIAGNOSIS — R293 Abnormal posture: Secondary | ICD-10-CM | POA: Insufficient documentation

## 2015-05-30 NOTE — Therapy (Signed)
[facility] [address] [place], [place], [address] Phone: [phone]   Fax:  [phone]  Physical Therapy Evaluation  Patient Details  Name: Brenda Floyd MRN: [id number] Date of Birth: 22-Jul-1940 Referring Provider: Derrel Nip  Encounter Date: 05/30/2015      PT End of Session - 05/30/15 2053    Visit Number 1   Number of Visits 8   Date for PT Re-Evaluation 06/27/15   Authorization - Visit Number 1   Authorization - Number of Visits 10   PT Start Time [id number]   PT Stop Time 1521   PT Time Calculation (min) 49 min   Equipment Utilized During Treatment Gait belt   Activity Tolerance Patient tolerated treatment well   Behavior During Therapy [facility] for tasks assessed/performed      Past Medical History  Diagnosis Date  . Colon polyps 2012    [place], followup due 2015  . Diverticulosis of sigmoid colon July 2012    by colonoscopy  . Fracture closed, femur, shaft ([place]) remote    s/p screws and rod repair  . Diabetes mellitus   . Hypertension   . Thyroid disease   . Cancer ([place])     skin ca    Past Surgical History  Procedure Laterality Date  . Joint replacement      bilateral knee, 2011  . Abdominal hysterectomy    . Breast cyst aspiration Left     neg    There were no vitals filed for this visit.       Subjective Assessment - 05/30/15 2046    Subjective Pt reports having increased falls d/t her L LE getting caught on an object at her feet causing her to fall FWD.  Pt has fallen 4-5x within the past year, and is able to get up herself.  Pt denies use of an AD, but owns a 4WW, FWW, and SPC.  Pt is becoming fearful of failling, and utilizes increased caution when ambulating on uneven ground.  Pt reports no broken bones as a result of her falls.  Pt is retired and enjoys spending her free time working with her chickens and reading on her kindle.  Pt states having difficulty getting out of/into her bath tub.     Limitations Walking   Patient Stated Goals To no longer be fearful of falling.    Currently in Pain? No/denies         OBJECTIVE: TherEx: Pt educated on and performed 15-30x exercises within her HEP including standing hip abd with isometric hold, standing marching with isometric hold, and FWD step ups.    Pt requires skilled PT services to improve safety with gait, improve gross LE strength, and improve dynamic/static balance.  Pt tolerated rx well today as evidenced by remaining an active participant throughout treatment and agreeing to participate within her HEP.        PT Education - 05/30/15 2052    Education provided Yes   Education Details Pt educated on HEP including standing hip abd and marching, and FWD step ups.     Person(s) Educated Patient   Methods Explanation;Demonstration;Tactile cues;Verbal cues;Handout   Comprehension Verbalized understanding;Returned demonstration;Verbal cues required             PT Long Term Goals - 05/30/15 2101    PT LONG TERM GOAL #1   Title Pt will report no falls for 1 consecutive month of treatment to improve pt's safety with ADLs.  Baseline Pt has fallen 4-5x within past year on 2015-06-13   Time 4   Period Weeks   Status New   PT LONG TERM GOAL #2   Title Pt will demonstrate B LE strength with MMT at 4+/5 without pain to improve pt's strength for function.    Baseline R: hip flex 4/5, knee ext 4/5, knee flex 4/5; L: hip flex 4-/5 with pain, knee ext 4/5, knee flex 4/5; B: hip abd 4-/5, hip add 4-/5 on 13-Jun-2015   Time 4   Period Weeks   Status New   PT LONG TERM GOAL #3   Title Pt will score 45/80 on LEFS self-perceived questionnaire to improve her LE function.   Baseline 36/80 on 06/13/15   Time 4   Period Weeks   Status New   PT LONG TERM GOAL #4   Title Pt will score 55/56 on berg balance test to improve pt's safety with static and dynamic balance.    Baseline 53/56 on 06/13/15   Time 4   Period Weeks   Status  New               Plan - 06-13-15 2054    Clinical Impression Statement Pt is a 75 y.o. female who presents to the clinic after increased falls within recent time.  Pt demonstrates decreased strength (R: hip flex 4/5, knee ext 4/5, knee flex 4/5; L: hip flex 4-/5 with pain, knee ext 4/5, knee flex 4/5; B: hip abd 4-/5, hip add 4-/5).  Pt  demonstrates decreased balance when performing berg balance test by scoring a 53/56.  Pt has increased swelling on B lateral ankles near malleoli, and minimal pes cavus.  Pt ambulates safety within closed environment and demonstrates B lateral trunk lean.  Pt presents with a difference in leg length with her L LE longer than the R (L: 89.5 cm; R: 89 cm).   Pt scored 36/80 on LEFS self-perceived questionnaire.  Pt. will benefit from skilled PT services to increase LE muscle strength to improve gait/balance/ prevent falls.    Rehab Potential Good   PT Frequency 2x / week   PT Duration 4 weeks   PT Treatment/Interventions Gait training;Stair training;Functional mobility training;Therapeutic activities;Therapeutic exercise;Balance training;Neuromuscular re-education;Patient/family education   PT Next Visit Plan Assess HEP.  Introduce strengthening exercises and balance training.   PT Home Exercise Plan Pt given standing abd and marching with increase isometric holds; and forward step ups.    Consulted and Agree with Plan of Care Patient      Patient will benefit from skilled therapeutic intervention in order to improve the following deficits and impairments:  Abnormal gait, Decreased activity tolerance, Decreased balance, Decreased endurance, Decreased strength, Difficulty walking, Improper body mechanics, Postural dysfunction, Obesity  Visit Diagnosis: Muscle weakness (generalized)  Other abnormalities of gait and mobility  Abnormal posture      G-Codes - 06/13/15 1631    Functional Assessment Tool Used LEFS/ Berg/ muscle weakness/ gait difficulty/  balance   Mobility: Walking and Moving Around Current Status [id number]) At least 20 percent but less than 40 percent impaired, limited or restricted   Mobility: Walking and Moving Around Goal Status [id number]) At least 1 percent but less than 20 percent impaired, limited or restricted       Problem List Patient Active Problem List   Diagnosis Date Noted  . Vitamin D toxicity 05/25/2015  . Loss of balance 05/25/2015  . Medication adverse effect 04/30/2015  . Encounter for preventive health  examination 01/31/2015  . Osteopenia determined by x-ray 08/19/2014  . Pain in joint, ankle and foot 07/28/2014  . Lumbago 07/26/2014  . Hypothyroidism due to acquired atrophy of thyroid 04/28/2014  . Depression with anxiety 04/28/2014  . Vitamin B12 deficiency (non anemic) 10/22/2013  . Statin intolerance 02/26/2013  . S/P TKR (total knee replacement) 02/25/2013  . Fecal incontinence alternating with constipation 11/08/2012  . Sacroiliac joint disease 02/01/2011  . Hyperlipidemia with target LDL less than 70 11/01/2010  . Diabetes mellitus type 2 in obese ([place])   . Essential hypertension   . Obesity (BMI 30-39.9)   . Diverticulosis of sigmoid colon    Pura Spice, PT, DPT # [phone]   05/31/2015, 4:32 PM  [facility] [address] [place], [place], [address] Phone: [phone]   Fax:  [phone]  Name: Brenda Floyd MRN: [id number] Date of Birth: 11-Apr-1940

## 2015-05-31 ENCOUNTER — Other Ambulatory Visit: Payer: Self-pay | Admitting: Internal Medicine

## 2015-06-03 ENCOUNTER — Ambulatory Visit: Payer: Medicare Other | Admitting: Physical Therapy

## 2015-06-03 ENCOUNTER — Encounter: Payer: Self-pay | Admitting: Physical Therapy

## 2015-06-03 DIAGNOSIS — M6281 Muscle weakness (generalized): Secondary | ICD-10-CM

## 2015-06-03 DIAGNOSIS — R293 Abnormal posture: Secondary | ICD-10-CM | POA: Diagnosis not present

## 2015-06-03 DIAGNOSIS — R2689 Other abnormalities of gait and mobility: Secondary | ICD-10-CM

## 2015-06-04 NOTE — Therapy (Signed)
[facility] [address] [place], [place], [address] Phone: [phone]   Fax:  [phone]  Physical Therapy Treatment  Patient Details  Name: Brenda Floyd MRN: [id number] Date of Birth: 1940-02-26 Referring Provider: Derrel Nip  Encounter Date: 06/03/2015      PT End of Session - 06/04/15 0736    Visit Number 2   Number of Visits 8   Date for PT Re-Evaluation 06/27/15   Authorization - Visit Number 2   Authorization - Number of Visits 10   PT Start Time [id number]   PT Stop Time 1519   PT Time Calculation (min) 48 min   Equipment Utilized During Treatment Gait belt   Activity Tolerance Patient tolerated treatment well   Behavior During Therapy [facility] for tasks assessed/performed      Past Medical History  Diagnosis Date  . Colon polyps 2012    [facility]er, followup due 2015  . Diverticulosis of sigmoid colon July 2012    by colonoscopy  . Fracture closed, femur, shaft ([place]) remote    s/p screws and rod repair  . Diabetes mellitus   . Hypertension   . Thyroid disease   . Cancer ([place])     skin ca    Past Surgical History  Procedure Laterality Date  . Joint replacement      bilateral knee, 2011  . Abdominal hysterectomy    . Breast cyst aspiration Left     neg    There were no vitals filed for this visit.      Subjective Assessment - 06/04/15 0730    Subjective Pt reports that she is doing well today.  Pt states compliance with her HEP, but that her standing hip abd hurts her R hip.     Limitations Walking   Patient Stated Goals To no longer be fearful of falling.    Currently in Pain? No/denies      OBJECTIVE: Manual:  12 minutes soft tissue to posterolateral R hip with pt lying on L side with knees bent and a pillow b/w her knees.  Ther Ex: 10 min SCIFIT at level 5.5 resistance. 20-30x bridging.  20-30x hooklying B clams with red theraband.  20-30x alternating LE hooklying kickouts.  30x squats on total gym.  20-30x  squats on total gym with hip add bal squeeze.  Reviewed HEP.     Pt requires skilled PT services to increase gross LE strength, improve balance, and improve static/dynamic posture.  Pt tolerated treatment well today as evidenced by demonstrating complance with her assigned HEP, and reporting no falls since initial evaluation.        PT Education - 06/04/15 0733    Education provided Yes   Education Details Pt educated on her possible ishial bursitis and that she should improve her amount of mobility to decrease irritation of her hip.    Person(s) Educated Patient   Methods Explanation   Comprehension Verbalized understanding             PT Long Term Goals - 05/30/15 2101    PT LONG TERM GOAL #1   Title Pt will report no falls for 1 consecutive month of treatment to improve pt's safety with ADLs.    Baseline Pt has fallen 4-5x within past year on 05/30/2015   Time 4   Period Weeks   Status New   PT LONG TERM GOAL #2   Title Pt will demonstrate B LE strength with MMT at 4+/5 without  pain to improve pt's strength for function.    Baseline R: hip flex 4/5, knee ext 4/5, knee flex 4/5; L: hip flex 4-/5 with pain, knee ext 4/5, knee flex 4/5; B: hip abd 4-/5, hip add 4-/5 on 05/30/2015   Time 4   Period Weeks   Status New   PT LONG TERM GOAL #3   Title Pt will score 45/80 on LEFS self-perceived questionnaire to improve her LE function.   Baseline 36/80 on 05/30/2015   Time 4   Period Weeks   Status New   PT LONG TERM GOAL #4   Title Pt will score 55/56 on berg balance test to improve pt's safety with static and dynamic balance.    Baseline 53/56 on 05/30/2015   Time 4   Period Weeks   Status New            Plan - 06/04/15 0737    Clinical Impression Statement Pt presents with c/o pain in her R hip that is severely tender to touch at ishial tuberosity, and pt has tenderness over region of posterolateral R hip.  Pt required increased rest breaks throughout treatment d/t   decreased cardiovascular health.    Rehab Potential Good   PT Frequency 2x / week   PT Duration 4 weeks   PT Treatment/Interventions Gait training;Stair training;Functional mobility training;Therapeutic activities;Therapeutic exercise;Balance training;Neuromuscular re-education;Patient/family education   PT Next Visit Plan Continue to introduce strengthening and balance exercises.    PT Home Exercise Plan Pt given standing abd and marching with increase isometric holds; and forward step ups.    Consulted and Agree with Plan of Care Patient      Patient will benefit from skilled therapeutic intervention in order to improve the following deficits and impairments:  Abnormal gait, Decreased activity tolerance, Decreased balance, Decreased endurance, Decreased strength, Difficulty walking, Improper body mechanics, Postural dysfunction, Obesity  Visit Diagnosis: Muscle weakness (generalized)  Other abnormalities of gait and mobility  Abnormal posture     Problem List Patient Active Problem List   Diagnosis Date Noted  . Vitamin D toxicity 05/25/2015  . Loss of balance 05/25/2015  . Medication adverse effect 04/30/2015  . Encounter for preventive health examination 01/31/2015  . Osteopenia determined by x-ray 08/19/2014  . Pain in joint, ankle and foot 07/28/2014  . Lumbago 07/26/2014  . Hypothyroidism due to acquired atrophy of thyroid 04/28/2014  . Depression with anxiety 04/28/2014  . Vitamin B12 deficiency (non anemic) 10/22/2013  . Statin intolerance 02/26/2013  . S/P TKR (total knee replacement) 02/25/2013  . Fecal incontinence alternating with constipation 11/08/2012  . Sacroiliac joint disease 02/01/2011  . Hyperlipidemia with target LDL less than 70 11/01/2010  . Diabetes mellitus type 2 in obese ([place])   . Essential hypertension   . Obesity (BMI 30-39.9)   . Diverticulosis of sigmoid colon    Pura Spice, PT, DPT # [id number] Hermelinda Dellen, SPT  06/04/2015, 12:44  PM  [facility] [address] [place], [place], [address] Phone: [phone]   Fax:  [phone]  Name: Brenda Floyd MRN: [id number] Date of Birth: 1940/11/14

## 2015-06-05 ENCOUNTER — Other Ambulatory Visit: Payer: Self-pay

## 2015-06-05 ENCOUNTER — Ambulatory Visit: Payer: Medicare Other | Admitting: Physical Therapy

## 2015-06-05 ENCOUNTER — Encounter: Payer: Self-pay | Admitting: Physical Therapy

## 2015-06-05 DIAGNOSIS — R2689 Other abnormalities of gait and mobility: Secondary | ICD-10-CM | POA: Diagnosis not present

## 2015-06-05 DIAGNOSIS — M6281 Muscle weakness (generalized): Secondary | ICD-10-CM | POA: Diagnosis not present

## 2015-06-05 DIAGNOSIS — R293 Abnormal posture: Secondary | ICD-10-CM

## 2015-06-05 MED ORDER — INSULIN GLARGINE 100 UNIT/ML SOLOSTAR PEN: PEN_INJECTOR | SUBCUTANEOUS | Status: DC

## 2015-06-05 NOTE — Telephone Encounter (Signed)
Refill request from [facility] for lantus.  Refill sent.

## 2015-06-06 NOTE — Therapy (Signed)
[facility] [address] [place], [place], [address] Phone: [phone]   Fax:  [phone]  Physical Therapy Treatment  Patient Details  Name: Brenda Floyd MRN: [id number] Date of Birth: [birth year] Referring Provider: Derrel Nip  Encounter Date: 06/05/2015      PT End of Session - 06/05/15 1546    Visit Number 3   Number of Visits 8   Date for PT Re-Evaluation 06/27/15   Authorization - Visit Number 3   Authorization - Number of Visits 10   PT Start Time [id number]   PT Stop Time 1519   PT Time Calculation (min) 47 min   Equipment Utilized During Treatment Gait belt   Activity Tolerance Patient tolerated treatment well   Behavior During Therapy [facility] for tasks assessed/performed      Past Medical History  Diagnosis Date  . Colon polyps 2012    [facility], followup due 2015  . Diverticulosis of sigmoid colon July 2012    by colonoscopy  . Fracture closed, femur, shaft ([place]) remote    s/p screws and rod repair  . Diabetes mellitus   . Hypertension   . Thyroid disease   . Cancer ([place])     skin ca    Past Surgical History  Procedure Laterality Date  . Joint replacement      bilateral knee, 2011  . Abdominal hysterectomy    . Breast cyst aspiration Left     neg    There were no vitals filed for this visit.      Subjective Assessment - 06/05/15 1545    Subjective Pt reports that she is well today, and spent the day with her chickens.  Pt states that yesterday she did not do anything, and has been bathing in biofreeze.     Limitations Walking   Patient Stated Goals To no longer be fearful of falling.    Currently in Pain? No/denies     OBJECTIVE: TherEx: 10 min SCIFIT at level 5.5 resistance.  2x15-20 bridging with v.c. To complete full motion.  2x10-15 B SLR.  2-3x floor transfers at blue mat with minax1.  20-30x length of parallel bars hip abd walking with yellow theraband around ankles with v.c. To take bigger steps  and to keep toes pointed forward.  HEP progression (handouts).     Pt requires skilled PT services to increase B LE strength, improve safety with ambulation, and improve static/dynamic balance.  Pt tolerated treatment well, but had 1 LOB when stepping off of the SCIFIT d/t an object on her left that resulted in maxax1 to correct.       PT Education - 06/05/15 1546    Education provided Yes   Education Details Educated on decreased peripheral vision on her L eye.    Person(s) Educated Patient   Methods Explanation   Comprehension Verbalized understanding             PT Long Term Goals - 05/30/15 2101    PT LONG TERM GOAL #1   Title Pt will report no falls for 1 consecutive month of treatment to improve pt's safety with ADLs.    Baseline Pt has fallen 4-5x within past year on 05/30/2015   Time 4   Period Weeks   Status New   PT LONG TERM GOAL #2   Title Pt will demonstrate B LE strength with MMT at 4+/5 without pain to improve pt's strength for function.    Baseline R: hip flex  4/5, knee ext 4/5, knee flex 4/5; L: hip flex 4-/5 with pain, knee ext 4/5, knee flex 4/5; B: hip abd 4-/5, hip add 4-/5 on 05/30/2015   Time 4   Period Weeks   Status New   PT LONG TERM GOAL #3   Title Pt will score 45/80 on LEFS self-perceived questionnaire to improve her LE function.   Baseline 36/80 on 05/30/2015   Time 4   Period Weeks   Status New   PT LONG TERM GOAL #4   Title Pt will score 55/56 on berg balance test to improve pt's safety with static and dynamic balance.    Baseline 53/56 on 05/30/2015   Time 4   Period Weeks   Status New            Plan - 06/05/15 1547    Clinical Impression Statement Pt had 1 LOB when getting off walking around the SCIFIT bike that required a maxax1 to correct.  Pt demonstrates decreased peripheral vision on her L side, but intact CN3, 4, and 6.  This may be causing pt's consistent falls to the L d/t objects on the ground.   Rehab Potential Good    PT Frequency 2x / week   PT Duration 4 weeks   PT Treatment/Interventions Gait training;Stair training;Functional mobility training;Therapeutic activities;Therapeutic exercise;Balance training;Neuromuscular re-education;Patient/family education   PT Next Visit Plan Continue to introduce strengthening and balance exercises.    PT Home Exercise Plan Pt given standing abd and marching with increase isometric holds; and forward step ups.    Consulted and Agree with Plan of Care Patient      Patient will benefit from skilled therapeutic intervention in order to improve the following deficits and impairments:  Abnormal gait, Decreased activity tolerance, Decreased balance, Decreased endurance, Decreased strength, Difficulty walking, Improper body mechanics, Postural dysfunction, Obesity  Visit Diagnosis: Muscle weakness (generalized)  Other abnormalities of gait and mobility  Abnormal posture     Problem List Patient Active Problem List   Diagnosis Date Noted  . Vitamin D toxicity 05/25/2015  . Loss of balance 05/25/2015  . Medication adverse effect 04/30/2015  . Encounter for preventive health examination 01/31/2015  . Osteopenia determined by x-ray 08/19/2014  . Pain in joint, ankle and foot 07/28/2014  . Lumbago 07/26/2014  . Hypothyroidism due to acquired atrophy of thyroid 04/28/2014  . Depression with anxiety 04/28/2014  . Vitamin B12 deficiency (non anemic) 10/22/2013  . Statin intolerance 02/26/2013  . S/P TKR (total knee replacement) 02/25/2013  . Fecal incontinence alternating with constipation 11/08/2012  . Sacroiliac joint disease 02/01/2011  . Hyperlipidemia with target LDL less than 70 11/01/2010  . Diabetes mellitus type 2 in obese ([place])   . Essential hypertension   . Obesity (BMI 30-39.9)   . Diverticulosis of sigmoid colon    Pura Spice, PT, DPT # [id number] Hermelinda Dellen, SPT   06/06/2015, 4:07 PM  [place] [facility] [address] [place], [place], [address] Phone: [phone]   Fax:  [phone]  Name: LYLAH HEGSTAD MRN: [id number] Date of Birth: 1940-06-06

## 2015-06-08 ENCOUNTER — Other Ambulatory Visit: Payer: Self-pay | Admitting: Internal Medicine

## 2015-06-11 ENCOUNTER — Ambulatory Visit: Payer: Medicare Other | Attending: Internal Medicine | Admitting: Physical Therapy

## 2015-06-11 ENCOUNTER — Encounter: Payer: Self-pay | Admitting: Physical Therapy

## 2015-06-11 DIAGNOSIS — M6281 Muscle weakness (generalized): Secondary | ICD-10-CM | POA: Insufficient documentation

## 2015-06-11 DIAGNOSIS — R2689 Other abnormalities of gait and mobility: Secondary | ICD-10-CM | POA: Diagnosis not present

## 2015-06-11 DIAGNOSIS — R293 Abnormal posture: Secondary | ICD-10-CM | POA: Diagnosis not present

## 2015-06-11 NOTE — Therapy (Addendum)
[facility] [address] [place], [place], [address] Phone: [phone]   Fax:  [phone]  Physical Therapy Treatment  Patient Details  Name: NOEMY PELLERIN MRN: [id number] Date of Birth: May 12, 1940 Referring Provider: Derrel Nip  Encounter Date: 06/11/2015    Past Medical History  Diagnosis Date  . Colon polyps 2012    [facility]er, followup due 2015  . Diverticulosis of sigmoid colon July 2012    by colonoscopy  . Fracture closed, femur, shaft ([place]) remote    s/p screws and rod repair  . Diabetes mellitus   . Hypertension   . Thyroid disease   . Cancer ([place])     skin ca    Past Surgical History  Procedure Laterality Date  . Joint replacement      bilateral knee, 2011  . Abdominal hysterectomy    . Breast cyst aspiration Left     neg    There were no vitals filed for this visit.   Pt. reports no knee/LE complaints at this time and states she hasn't fallen recently.  Pt. discussed her ability to tall kneel on ground and return to standing with L leg up (difficulty keeping leg in midline).       OBJECTIVE: TherEx: Nustep L7 10 min. B LE (warm-up/ no charge/ consistent cadence with B LE). Standing 2.5# hip and knee ex. (marching/ lateral steps/ knee flexion/ seated LAQ).  2.5# step ups/ downs on 6 inch step 20x2 each.  Supine SLR/ trunk rotn./ hip flexion 10x2 each.  Neuro mm.:  Functional reach with elevated table/cones.  Cone taps in //-bars L/R 10x.  Tandem stance and gait (forward and backwards).  Walking with alt. UE/LE marching (cuing for upright posture with mirror feedback).  Transfers from chair to mat table with no UE assist.    Pt requires skilled PT services to increase B LE strength, improve safety with ambulation, and improve static/dynamic balance. Decrease rest breaks required with standing/ LE resisted ex. Program.    Pt. requires verbal cuing for posture correction and to turn head to L to assess  surroundings.  Pt. had no LOB today and fewer seated rest breaks during standing ther.ex./ balance tasks.  Pt. will continue to benefit from LE resisted ex. program with progression to higher level balance tasks.        PT Long Term Goals - 05/30/15 2101    PT LONG TERM GOAL #1   Title Pt will report no falls for 1 consecutive month of treatment to improve pt's safety with ADLs.    Baseline Pt has fallen 4-5x within past year on 05/30/2015   Time 4   Period Weeks   Status New   PT LONG TERM GOAL #2   Title Pt will demonstrate B LE strength with MMT at 4+/5 without pain to improve pt's strength for function.    Baseline R: hip flex 4/5, knee ext 4/5, knee flex 4/5; L: hip flex 4-/5 with pain, knee ext 4/5, knee flex 4/5; B: hip abd 4-/5, hip add 4-/5 on 05/30/2015   Time 4   Period Weeks   Status New   PT LONG TERM GOAL #3   Title Pt will score 45/80 on LEFS self-perceived questionnaire to improve her LE function.   Baseline 36/80 on 05/30/2015   Time 4   Period Weeks   Status New   PT LONG TERM GOAL #4   Title Pt will score 55/56 on berg balance test to  improve pt's safety with static and dynamic balance.    Baseline 53/56 on 05/30/2015   Time 4   Period Weeks   Status New       Patient will benefit from skilled therapeutic intervention in order to improve the following deficits and impairments:  Abnormal gait, Decreased activity tolerance, Decreased balance, Decreased endurance, Decreased strength, Difficulty walking, Improper body mechanics, Postural dysfunction, Obesity  Visit Diagnosis: Muscle weakness (generalized)  Other abnormalities of gait and mobility  Abnormal posture     Problem List Patient Active Problem List   Diagnosis Date Noted  . Vitamin D toxicity 05/25/2015  . Loss of balance 05/25/2015  . Medication adverse effect 04/30/2015  . Encounter for preventive health examination 01/31/2015  . Osteopenia determined by x-ray 08/19/2014  . Pain in  joint, ankle and foot 07/28/2014  . Lumbago 07/26/2014  . Hypothyroidism due to acquired atrophy of thyroid 04/28/2014  . Depression with anxiety 04/28/2014  . Vitamin B12 deficiency (non anemic) 10/22/2013  . Statin intolerance 02/26/2013  . S/P TKR (total knee replacement) 02/25/2013  . Fecal incontinence alternating with constipation 11/08/2012  . Sacroiliac joint disease 02/01/2011  . Hyperlipidemia with target LDL less than 70 11/01/2010  . Diabetes mellitus type 2 in obese ([place])   . Essential hypertension   . Obesity (BMI 30-39.9)   . Diverticulosis of sigmoid colon    Pura Spice, PT, DPT # [id number]   06/18/2015, 1:35 PM  [place] [facility] [address] [place], [place], [address] Phone: [phone]   Fax:  [phone]  Name: JNYA WEBRE MRN: [id number] Date of Birth: [birth year]

## 2015-06-11 NOTE — Telephone Encounter (Signed)
Pt requesting a refill. Last OV 04/29/15,ast filled 01/09/15 #90. Ok to refill?

## 2015-06-12 ENCOUNTER — Telehealth: Payer: Self-pay

## 2015-06-12 MED ORDER — MELOXICAM 15 MG PO TABS: 15.0000 mg | ORAL_TABLET | Freq: Every day | ORAL | Status: DC

## 2015-06-12 NOTE — Telephone Encounter (Signed)
refilled 

## 2015-06-12 NOTE — Telephone Encounter (Signed)
Meloxicam refill result.  Last filled 03/06/15, last seen 04/30/15.  Please advise.

## 2015-06-13 ENCOUNTER — Ambulatory Visit: Payer: Medicare Other | Admitting: Physical Therapy

## 2015-06-13 DIAGNOSIS — R293 Abnormal posture: Secondary | ICD-10-CM | POA: Diagnosis not present

## 2015-06-13 DIAGNOSIS — R2689 Other abnormalities of gait and mobility: Secondary | ICD-10-CM

## 2015-06-13 DIAGNOSIS — M6281 Muscle weakness (generalized): Secondary | ICD-10-CM

## 2015-06-14 NOTE — Therapy (Addendum)
[facility] [address] [place], [place], [address] Phone: [phone]   Fax:  [phone]  Physical Therapy Treatment  Patient Details  Name: Brenda Floyd MRN: [id number] Date of Birth: 1940-12-17 Referring Provider: Derrel Nip  Encounter Date: 06/13/2015    Past Medical History  Diagnosis Date  . Colon polyps 2012    [facility]er, followup due 2015  . Diverticulosis of sigmoid colon July 2012    by colonoscopy  . Fracture closed, femur, shaft ([place]) remote    s/p screws and rod repair  . Diabetes mellitus   . Hypertension   . Thyroid disease   . Cancer ([place])     skin ca    Past Surgical History  Procedure Laterality Date  . Joint replacement      bilateral knee, 2011  . Abdominal hysterectomy    . Breast cyst aspiration Left     neg    There were no vitals filed for this visit.      Subjective Assessment - 06/18/15 1339    Subjective Pt. states she is really dizzy today.  Pt. states she thinks the excess honeysuckle outside is causing her dizziness.     Limitations Walking   Patient Stated Goals To no longer be fearful of falling.    Currently in Pain? No/denies      OBJECTIVE: TherEx: Nustep L7 10 min. B LE (warm-up/ no charge/ consistent cadence with B LE)- no increase c/o dizziness. Standing 2.5# hip and knee ex. (marching/ lateral steps/ knee flexion/ seated LAQ). 2.5# ankle wts. With walking in clinic with exaggerated hip flexion/ stride length to improve LE strengthening with gait.  Recip. Step ups/ downs with UE assist and cuing to correct posture 10x2. Neuro mm.: Alt. UE/LE in seated position progressing to standing in //-bars for safety (mirror feedback). Cone taps (heel and toe) in //-bars L/R 10x. Tandem stance and gait (forward and backwards). Obstacle course ([employer]/ maneuvering in tight spaces/ cones/ turning).   Pt requires skilled PT services to increase B LE strength, improve safety  with ambulation, and improve static/dynamic balance. Dizziness t/o tx. limiting progression of balance tasks.   Pt. requires verbal cuing for posture correction and to turn head to L to assess surroundings. Pt. had no LOB today and fewer seated rest breaks during standing ther.ex./ balance tasks. Pt. will continue to benefit from LE resisted ex. program with progression to higher level balance tasks.        PT Long Term Goals - 05/30/15 2101    PT LONG TERM GOAL #1   Title Pt will report no falls for 1 consecutive month of treatment to improve pt's safety with ADLs.    Baseline Pt has fallen 4-5x within past year on 05/30/2015   Time 4   Period Weeks   Status New   PT LONG TERM GOAL #2   Title Pt will demonstrate B LE strength with MMT at 4+/5 without pain to improve pt's strength for function.    Baseline R: hip flex 4/5, knee ext 4/5, knee flex 4/5; L: hip flex 4-/5 with pain, knee ext 4/5, knee flex 4/5; B: hip abd 4-/5, hip add 4-/5 on 05/30/2015   Time 4   Period Weeks   Status New   PT LONG TERM GOAL #3   Title Pt will score 45/80 on LEFS self-perceived questionnaire to improve her LE function.   Baseline 36/80 on 05/30/2015   Time 4   Period Weeks  Status New   PT LONG TERM GOAL #4   Title Pt will score 55/56 on berg balance test to improve pt's safety with static and dynamic balance.    Baseline 53/56 on 05/30/2015   Time 4   Period Weeks   Status New               Plan - 06/18/15 1341    Clinical Impression Statement No LOB during balance tasks in clinic today.  Pt. had several episodes of dizziness during standing/ balance tasks which required short standing/ seated rest breaks.  Limited heel strike to toe off during stance phase of gait and cuing to increase upright posture/ head position.     Rehab Potential Good   PT Frequency 2x / week   PT Duration 4 weeks   PT Treatment/Interventions Gait training;Stair training;Functional mobility  training;Therapeutic activities;Therapeutic exercise;Balance training;Neuromuscular re-education;Patient/family education   PT Next Visit Plan Continue to introduce strengthening and balance exercises.  Discuss HEP   PT Home Exercise Plan Pt given standing abd and marching with increase isometric holds; and forward step ups.    Consulted and Agree with Plan of Care Patient      Patient will benefit from skilled therapeutic intervention in order to improve the following deficits and impairments:  Abnormal gait, Decreased activity tolerance, Decreased balance, Decreased endurance, Decreased strength, Difficulty walking, Improper body mechanics, Postural dysfunction, Obesity  Visit Diagnosis: Muscle weakness (generalized)  Other abnormalities of gait and mobility  Abnormal posture     Problem List Patient Active Problem List   Diagnosis Date Noted  . Vitamin D toxicity 05/25/2015  . Loss of balance 05/25/2015  . Medication adverse effect 04/30/2015  . Encounter for preventive health examination 01/31/2015  . Osteopenia determined by x-ray 08/19/2014  . Pain in joint, ankle and foot 07/28/2014  . Lumbago 07/26/2014  . Hypothyroidism due to acquired atrophy of thyroid 04/28/2014  . Depression with anxiety 04/28/2014  . Vitamin B12 deficiency (non anemic) 10/22/2013  . Statin intolerance 02/26/2013  . S/P TKR (total knee replacement) 02/25/2013  . Fecal incontinence alternating with constipation 11/08/2012  . Sacroiliac joint disease 02/01/2011  . Hyperlipidemia with target LDL less than 70 11/01/2010  . Diabetes mellitus type 2 in obese ([place])   . Essential hypertension   . Obesity (BMI 30-39.9)   . Diverticulosis of sigmoid colon    Pura Spice, PT, DPT # [id number]   06/18/2015, 1:45 PM  [place] [facility] [address] [place], [place], [address] Phone: [phone]   Fax:  [phone]  Name: HAYLEA CHAVARRIA MRN: [id number] Date  of Birth: 24-Jul-1940

## 2015-06-18 ENCOUNTER — Encounter: Payer: Self-pay | Admitting: Physical Therapy

## 2015-06-18 ENCOUNTER — Ambulatory Visit: Payer: Medicare Other | Admitting: Physical Therapy

## 2015-06-18 DIAGNOSIS — R293 Abnormal posture: Secondary | ICD-10-CM | POA: Diagnosis not present

## 2015-06-18 DIAGNOSIS — R2689 Other abnormalities of gait and mobility: Secondary | ICD-10-CM

## 2015-06-18 DIAGNOSIS — M6281 Muscle weakness (generalized): Secondary | ICD-10-CM

## 2015-06-18 NOTE — Therapy (Signed)
[facility] [address] [facility] [address] [place], [place], [address] Phone: [phone]   Fax:  [phone]  Physical Therapy Treatment  Patient Details  Name: Brenda Floyd MRN: [id number] Date of Birth: [birth year] Referring Provider: Derrel Nip  Encounter Date: 06/18/2015      PT End of Session - 06/18/15 1359    Visit Number 6   Number of Visits 8   Date for PT Re-Evaluation 06/27/15   Authorization - Visit Number 6   Authorization - Number of Visits 10   PT Start Time [id number]   PT Stop Time 1435   PT Time Calculation (min) 48 min   Equipment Utilized During Treatment Gait belt   Activity Tolerance Patient tolerated treatment well;Patient limited by fatigue   Behavior During Therapy [facility] for tasks assessed/performed      Past Medical History  Diagnosis Date  . Colon polyps 2012    [facility], followup due 2015  . Diverticulosis of sigmoid colon July 2012    by colonoscopy  . Fracture closed, femur, shaft ([place]) remote    s/p screws and rod repair  . Diabetes mellitus   . Hypertension   . Thyroid disease   . Cancer ([place])     skin ca    Past Surgical History  Procedure Laterality Date  . Joint replacement      bilateral knee, 2011  . Abdominal hysterectomy    . Breast cyst aspiration Left     neg    There were no vitals filed for this visit.      Subjective Assessment - 06/18/15 1353    Subjective Pt. states her dizziness is much better and thinks it may have been a result of blood sugar, not allergies.  Pt. states blood glucose was 96 prior to PT today.     Limitations Walking   Patient Stated Goals To no longer be fearful of falling.    Currently in Pain? No/denies        OBJECTIVE: TherEx: Nustep L7 10 min. B LE (warm-up/ no charge/ consistent cadence with B LE)- no increase c/o dizziness. Standing 2.5# hip and knee ex. (marching/ lateral steps/ knee flexion/ seated LAQ). 2.5# ankle wts. With walking in clinic with  exaggerated hip flexion/ stride length to improve LE strengthening with gait. Recip. Step ups/ downs with UE assist and cuing to correct posture 10x2. Neuro mm.: Alt. UE/LE in seated position progressing to standing in //-bars for safety (mirror feedback). Cone taps (heel and toe) in //-bars L/R 10x. Tandem stance and gait (forward and backwards). Obstacle course (Airex/ maneuvering in tight spaces/ cones/ turning).   Pt requires skilled PT services to increase B LE strength, improve safety with ambulation, and improve static/dynamic balance.           PT Long Term Goals - 05/30/15 2101    PT LONG TERM GOAL #1   Title Pt will report no falls for 1 consecutive month of treatment to improve pt's safety with ADLs.    Baseline Pt has fallen 4-5x within past year on 05/30/2015   Time 4   Period Weeks   Status New   PT LONG TERM GOAL #2   Title Pt will demonstrate B LE strength with MMT at 4+/5 without pain to improve pt's strength for function.    Baseline R: hip flex 4/5, knee ext 4/5, knee flex 4/5; L: hip flex 4-/5 with pain, knee ext 4/5, knee flex 4/5; B: hip abd 4-/5, hip  add 4-/5 on 05/30/2015   Time 4   Period Weeks   Status New   PT LONG TERM GOAL #3   Title Pt will score 45/80 on LEFS self-perceived questionnaire to improve her LE function.   Baseline 36/80 on 05/30/2015   Time 4   Period Weeks   Status New   PT LONG TERM GOAL #4   Title Pt will score 55/56 on berg balance test to improve pt's safety with static and dynamic balance.    Baseline 53/56 on 05/30/2015   Time 4   Period Weeks   Status New               Plan - 06/18/15 1341    Clinical Impression Statement No LOB during balance tasks in clinic today.  Pt. had several episodes of dizziness during standing/ balance tasks which required short standing/ seated rest breaks.  Limited heel strike to toe off during stance phase of gait and cuing to increase upright posture/ head position.      Rehab Potential Good   PT Frequency 2x / week   PT Duration 4 weeks   PT Treatment/Interventions Gait training;Stair training;Functional mobility training;Therapeutic activities;Therapeutic exercise;Balance training;Neuromuscular re-education;Patient/family education   PT Next Visit Plan Continue to introduce strengthening and balance exercises.  Discuss HEP   PT Home Exercise Plan Pt given standing abd and marching with increase isometric holds; and forward step ups.    Consulted and Agree with Plan of Care Patient      Patient will benefit from skilled therapeutic intervention in order to improve the following deficits and impairments:     Visit Diagnosis: Muscle weakness (generalized)  Other abnormalities of gait and mobility  Abnormal posture     Problem List Patient Active Problem List   Diagnosis Date Noted  . Vitamin D toxicity 05/25/2015  . Loss of balance 05/25/2015  . Medication adverse effect 04/30/2015  . Encounter for preventive health examination 01/31/2015  . Osteopenia determined by x-ray 08/19/2014  . Pain in joint, ankle and foot 07/28/2014  . Lumbago 07/26/2014  . Hypothyroidism due to acquired atrophy of thyroid 04/28/2014  . Depression with anxiety 04/28/2014  . Vitamin B12 deficiency (non anemic) 10/22/2013  . Statin intolerance 02/26/2013  . S/P TKR (total knee replacement) 02/25/2013  . Fecal incontinence alternating with constipation 11/08/2012  . Sacroiliac joint disease 02/01/2011  . Hyperlipidemia with target LDL less than 70 11/01/2010  . Diabetes mellitus type 2 in obese ([place])   . Essential hypertension   . Obesity (BMI 30-39.9)   . Diverticulosis of sigmoid colon    Pura Spice, PT, DPT # [id number]   06/19/2015, 7:08 PM  [facility] [address] [place], [place], [address] Phone: [phone]   Fax:  [phone]  Name: Brenda Floyd MRN: [id number] Date of Birth: Jul 10, 1940

## 2015-06-20 ENCOUNTER — Ambulatory Visit: Payer: Medicare Other | Admitting: Physical Therapy

## 2015-06-20 DIAGNOSIS — M6281 Muscle weakness (generalized): Secondary | ICD-10-CM

## 2015-06-20 DIAGNOSIS — R293 Abnormal posture: Secondary | ICD-10-CM | POA: Diagnosis not present

## 2015-06-20 DIAGNOSIS — R2689 Other abnormalities of gait and mobility: Secondary | ICD-10-CM | POA: Diagnosis not present

## 2015-06-21 NOTE — Therapy (Signed)
[facility] [address] [place], [place], [address] Phone: [phone]   Fax:  [phone]  Physical Therapy Treatment  Patient Details  Name: Brenda Floyd MRN: [id number] Date of Birth: 08-18-1940 Referring Provider: Derrel Nip  Encounter Date: 06/20/2015      PT End of Session - 06/21/15 0846    Visit Number 7   Number of Visits 8   Date for PT Re-Evaluation 06/27/15   Authorization - Visit Number 7   Authorization - Number of Visits 10   PT Start Time [id number]   PT Stop Time 1419   PT Time Calculation (min) 52 min   Equipment Utilized During Treatment Gait belt   Activity Tolerance Patient tolerated treatment well;Patient limited by fatigue   Behavior During Therapy [facility] for tasks assessed/performed      Past Medical History  Diagnosis Date  . Colon polyps 2012    [facility], followup due 2015  . Diverticulosis of sigmoid colon July 2012    by colonoscopy  . Fracture closed, femur, shaft ([place]) remote    s/p screws and rod repair  . Diabetes mellitus   . Hypertension   . Thyroid disease   . Cancer ([place])     skin ca    Past Surgical History  Procedure Laterality Date  . Joint replacement      bilateral knee, 2011  . Abdominal hysterectomy    . Breast cyst aspiration Left     neg    There were no vitals filed for this visit.      Subjective Assessment - 06/21/15 0837    Subjective Pt. feeling better today.  Pt. states that PT/ exercise has been really helping her.  No new complaints.    Patient Stated Goals To no longer be fearful of falling.    Currently in Pain? No/denies            PT Long Term Goals - 05/30/15 2101    PT LONG TERM GOAL #1   Title Pt will report no falls for 1 consecutive month of treatment to improve pt's safety with ADLs.    Baseline Pt has fallen 4-5x within past year on 05/30/2015   Time 4   Period Weeks   Status New   PT LONG TERM GOAL #2   Title Pt will demonstrate B LE  strength with MMT at 4+/5 without pain to improve pt's strength for function.    Baseline R: hip flex 4/5, knee ext 4/5, knee flex 4/5; L: hip flex 4-/5 with pain, knee ext 4/5, knee flex 4/5; B: hip abd 4-/5, hip add 4-/5 on 05/30/2015   Time 4   Period Weeks   Status New   PT LONG TERM GOAL #3   Title Pt will score 45/80 on LEFS self-perceived questionnaire to improve her LE function.   Baseline 36/80 on 05/30/2015   Time 4   Period Weeks   Status New   PT LONG TERM GOAL #4   Title Pt will score 55/56 on berg balance test to improve pt's safety with static and dynamic balance.    Baseline 53/56 on 05/30/2015   Time 4   Period Weeks   Status New             Patient will benefit from skilled therapeutic intervention in order to improve the following deficits and impairments:     Visit Diagnosis: Muscle weakness (generalized)  Other abnormalities of gait and mobility  Abnormal  posture     Problem List Patient Active Problem List   Diagnosis Date Noted  . Vitamin D toxicity 05/25/2015  . Loss of balance 05/25/2015  . Medication adverse effect 04/30/2015  . Encounter for preventive health examination 01/31/2015  . Osteopenia determined by x-ray 08/19/2014  . Pain in joint, ankle and foot 07/28/2014  . Lumbago 07/26/2014  . Hypothyroidism due to acquired atrophy of thyroid 04/28/2014  . Depression with anxiety 04/28/2014  . Vitamin B12 deficiency (non anemic) 10/22/2013  . Statin intolerance 02/26/2013  . S/P TKR (total knee replacement) 02/25/2013  . Fecal incontinence alternating with constipation 11/08/2012  . Sacroiliac joint disease 02/01/2011  . Hyperlipidemia with target LDL less than 70 11/01/2010  . Diabetes mellitus type 2 in obese ([place])   . Essential hypertension   . Obesity (BMI 30-39.9)   . Diverticulosis of sigmoid colon    Pura Spice, PT, DPT # [phone]   06/21/2015, 8:48 AM   [facility] [address] [place], [place], [address] Phone: [phone]   Fax:  [phone]  Name: Brenda Floyd MRN: [id number] Date of Birth: 1940-08-13

## 2015-06-25 ENCOUNTER — Ambulatory Visit: Payer: Medicare Other | Admitting: Physical Therapy

## 2015-06-25 ENCOUNTER — Encounter: Payer: Self-pay | Admitting: Physical Therapy

## 2015-06-25 DIAGNOSIS — M6281 Muscle weakness (generalized): Secondary | ICD-10-CM | POA: Diagnosis not present

## 2015-06-25 DIAGNOSIS — R2689 Other abnormalities of gait and mobility: Secondary | ICD-10-CM

## 2015-06-25 DIAGNOSIS — R293 Abnormal posture: Secondary | ICD-10-CM | POA: Diagnosis not present

## 2015-06-25 NOTE — Therapy (Signed)
[facility] [address] [place], [place], [address] Phone: [phone]   Fax:  [phone]  Physical Therapy Treatment  Patient Details  Name: Brenda Floyd MRN: [id number] Date of Birth: [birth year] Referring Provider: Derrel Nip  Encounter Date: 06/25/2015      PT End of Session - 06/25/15 1446    Visit Number 8   Number of Visits 8   Date for PT Re-Evaluation 06/27/15   Authorization - Visit Number 8   Authorization - Number of Visits 10   PT Start Time [id number]   PT Stop Time 1525   PT Time Calculation (min) 56 min   Activity Tolerance Patient tolerated treatment well;Patient limited by fatigue   Behavior During Therapy [facility] for tasks assessed/performed      Past Medical History  Diagnosis Date  . Colon polyps 2012    [facility], followup due 2015  . Diverticulosis of sigmoid colon July 2012    by colonoscopy  . Fracture closed, femur, shaft ([place]) remote    s/p screws and rod repair  . Diabetes mellitus   . Hypertension   . Thyroid disease   . Cancer ([place])     skin ca    Past Surgical History  Procedure Laterality Date  . Joint replacement      bilateral knee, 2011  . Abdominal hysterectomy    . Breast cyst aspiration Left     neg    There were no vitals filed for this visit.       Pt. States she is doing better since starting PT.    Tandem f/b.  Step ups/ overs.     OBJECTIVE: TherEx: Nustep L7 10 min. B LE (warm-up/ no charge/ consistent cadence with B LE).  Seated RTB hip abd./ knee flexion/ marching 30x each.  Supine red bolster bridging 10x2/  Standing 2.5# hip and knee ex. (marching/ lateral steps/ knee flexion/ seated LAQ). 2 Recip. Step ups/ downs with UE assist and cuing to correct posture 10x2. Neuro mm.: Alt. UE/LE in seated position progressing to standing in //-bars for safety (mirror feedback). Cone taps (heel and toe) in //-bars L/R 10x. Tandem stance and gait (forward and  backwards).  Pt requires skilled PT services to increase B LE strength, improve safety with ambulation, and improve static/dynamic balance.           PT Long Term Goals - 05/30/15 2101    PT LONG TERM GOAL #1   Title Pt will report no falls for 1 consecutive month of treatment to improve pt's safety with ADLs.    Baseline Pt has fallen 4-5x within past year on 05/30/2015   Time 4   Period Weeks   Status New   PT LONG TERM GOAL #2   Title Pt will demonstrate B LE strength with MMT at 4+/5 without pain to improve pt's strength for function.    Baseline R: hip flex 4/5, knee ext 4/5, knee flex 4/5; L: hip flex 4-/5 with pain, knee ext 4/5, knee flex 4/5; B: hip abd 4-/5, hip add 4-/5 on 05/30/2015   Time 4   Period Weeks   Status New   PT LONG TERM GOAL #3   Title Pt will score 45/80 on LEFS self-perceived questionnaire to improve her LE function.   Baseline 36/80 on 05/30/2015   Time 4   Period Weeks   Status New   PT LONG TERM GOAL #4   Title Pt will score 55/56 on  berg balance test to improve pt's safety with static and dynamic balance.    Baseline 53/56 on 05/30/2015   Time 4   Period Weeks   Status New               Plan - 06/25/15 1448    Rehab Potential Good   PT Frequency 2x / week   PT Duration 4 weeks   PT Next Visit Plan Review GOALS.  Recert vs. Discharge.    PT Home Exercise Plan Pt given standing abd and marching with increase isometric holds; and forward step ups.       Patient will benefit from skilled therapeutic intervention in order to improve the following deficits and impairments:     Visit Diagnosis: Muscle weakness (generalized)  Other abnormalities of gait and mobility  Abnormal posture     Problem List Patient Active Problem List   Diagnosis Date Noted  . Vitamin D toxicity 05/25/2015  . Loss of balance 05/25/2015  . Medication adverse effect 04/30/2015  . Encounter for preventive health examination  01/31/2015  . Osteopenia determined by x-ray 08/19/2014  . Pain in joint, ankle and foot 07/28/2014  . Lumbago 07/26/2014  . Hypothyroidism due to acquired atrophy of thyroid 04/28/2014  . Depression with anxiety 04/28/2014  . Vitamin B12 deficiency (non anemic) 10/22/2013  . Statin intolerance 02/26/2013  . S/P TKR (total knee replacement) 02/25/2013  . Fecal incontinence alternating with constipation 11/08/2012  . Sacroiliac joint disease 02/01/2011  . Hyperlipidemia with target LDL less than 70 11/01/2010  . Diabetes mellitus type 2 in obese ([place])   . Essential hypertension   . Obesity (BMI 30-39.9)   . Diverticulosis of sigmoid colon    Pura Spice, PT, DPT # [phone]   06/26/2015, 7:13 PM  [facility] [address] [place], [place], [address] Phone: [phone]   Fax:  [phone]  Name: Brenda Floyd MRN: [id number] Date of Birth: [birth year]

## 2015-06-27 ENCOUNTER — Ambulatory Visit: Payer: Medicare Other | Admitting: Physical Therapy

## 2015-06-27 DIAGNOSIS — R2689 Other abnormalities of gait and mobility: Secondary | ICD-10-CM

## 2015-06-27 DIAGNOSIS — R293 Abnormal posture: Secondary | ICD-10-CM | POA: Diagnosis not present

## 2015-06-27 DIAGNOSIS — M6281 Muscle weakness (generalized): Secondary | ICD-10-CM | POA: Diagnosis not present

## 2015-06-27 NOTE — Therapy (Signed)
[facility] [address] [place], [place], [address] Phone: [phone]   Fax:  [phone]  Physical Therapy Treatment  Patient Details  Name: Brenda Floyd MRN: [id number] Date of Birth: 06/28/1940 Referring Provider: Derrel Nip  Encounter Date: 06/27/2015      PT End of Session - 06/28/15 1458    Visit Number 9   Number of Visits 8   Date for PT Re-Evaluation 06/27/15   Authorization - Visit Number 9   Authorization - Number of Visits 10   PT Start Time 2542   PT Stop Time 1430   PT Time Calculation (min) 53 min   Equipment Utilized During Treatment Gait belt   Activity Tolerance Patient tolerated treatment well;Patient limited by fatigue   Behavior During Therapy [facility] for tasks assessed/performed      Past Medical History  Diagnosis Date  . Colon polyps 2012    [facility], followup due 2015  . Diverticulosis of sigmoid colon July 2012    by colonoscopy  . Fracture closed, femur, shaft ([place]) remote    s/p screws and rod repair  . Diabetes mellitus   . Hypertension   . Thyroid disease   . Cancer ([place])     skin ca    Past Surgical History  Procedure Laterality Date  . Joint replacement      bilateral knee, 2011  . Abdominal hysterectomy    . Breast cyst aspiration Left     neg    There were no vitals filed for this visit.      Subjective Assessment - 06/28/15 1457    Subjective Pt. states she has really benefited from PT and is happy with her progress.  No falls reported.     Limitations Walking   Patient Stated Goals To no longer be fearful of falling.    Currently in Pain? No/denies           PT Long Term Goals - 06/27/15 1347    PT LONG TERM GOAL #1   Title Pt will report no falls for 1 consecutive month of treatment to improve pt's safety with ADLs.    Baseline No falls   Time 4   Period Weeks   Status Achieved   PT LONG TERM GOAL #2   Title Pt will demonstrate B LE strength with MMT at 4+/5  without pain to improve pt's strength for function.    Baseline B LE MMT grossly 5/5 MMT except B hip flexion/ abd. 4+/5 MMT.    Time 4   Period Weeks   Status Achieved   PT LONG TERM GOAL #3   Title Pt will score 45/80 on LEFS self-perceived questionnaire to improve her LE function.   Baseline 62 out of 80 on LEFS on 5/18   Time 4   Period Weeks   Status Achieved   PT LONG TERM GOAL #4   Title Pt will score 55/56 on berg balance test to improve pt's safety with static and dynamic balance.    Baseline 54/56 on 06/27/15   Time 4   Period Weeks   Status Partially Met            Plan - 06/28/15 1459    Clinical Impression Statement Pt. will continue with HEP at this time and instructed to contact PT by 07/15/15 if any further issues.  Pt. has progressed well towards all goals.     PT Frequency 2x / week   PT Duration  4 weeks   PT Treatment/Interventions Gait training;Stair training;Functional mobility training;Therapeutic activities;Therapeutic exercise;Balance training;Neuromuscular re-education;Patient/family education   PT Next Visit Plan Discharge   PT Home Exercise Plan Pt given standing abd and marching with increase isometric holds; and forward step ups.    Consulted and Agree with Plan of Care Patient      Patient will benefit from skilled therapeutic intervention in order to improve the following deficits and impairments:  Abnormal gait, Decreased activity tolerance, Decreased balance, Decreased endurance, Decreased strength, Difficulty walking, Improper body mechanics, Postural dysfunction, Obesity  Visit Diagnosis: Muscle weakness (generalized)  Other abnormalities of gait and mobility  Abnormal posture       G-Codes - Jul 10, 2015 1500    Functional Assessment Tool Used LEFS/ Berg/ muscle weakness/ gait difficulty/ balance   Functional Limitation Mobility: Walking and moving around   Mobility: Walking and Moving Around Current Status (G1829) At least 1 percent but  less than 20 percent impaired, limited or restricted   Mobility: Walking and Moving Around Goal Status [phone]) At least 1 percent but less than 20 percent impaired, limited or restricted   Mobility: Walking and Moving Around Discharge Status [phone]) At least 1 percent but less than 20 percent impaired, limited or restricted      Problem List Patient Active Problem List   Diagnosis Date Noted  . Vitamin D toxicity 05/25/2015  . Loss of balance 05/25/2015  . Medication adverse effect 04/30/2015  . Encounter for preventive health examination 01/31/2015  . Osteopenia determined by x-ray 08/19/2014  . Pain in joint, ankle and foot 07/28/2014  . Lumbago 07/26/2014  . Hypothyroidism due to acquired atrophy of thyroid 04/28/2014  . Depression with anxiety 04/28/2014  . Vitamin B12 deficiency (non anemic) 10/22/2013  . Statin intolerance 02/26/2013  . S/P TKR (total knee replacement) 02/25/2013  . Fecal incontinence alternating with constipation 11/08/2012  . Sacroiliac joint disease 02/01/2011  . Hyperlipidemia with target LDL less than 70 11/01/2010  . Diabetes mellitus type 2 in obese ([place])   . Essential hypertension   . Obesity (BMI 30-39.9)   . Diverticulosis of sigmoid colon    Pura Spice, PT, DPT # [phone]   06/28/2015, 3:02 PM  [facility] [address] [place], [place], [address] Phone: [phone]   Fax:  [phone]  Name: Brenda Floyd MRN: [id number] Date of Birth: [birth year]

## 2015-07-07 ENCOUNTER — Other Ambulatory Visit: Payer: Self-pay | Admitting: Internal Medicine

## 2015-07-09 ENCOUNTER — Other Ambulatory Visit: Payer: Self-pay | Admitting: Internal Medicine

## 2015-07-09 ENCOUNTER — Encounter: Payer: Self-pay | Admitting: Internal Medicine

## 2015-07-09 ENCOUNTER — Other Ambulatory Visit: Payer: Self-pay

## 2015-07-09 DIAGNOSIS — E538 Deficiency of other specified B group vitamins: Secondary | ICD-10-CM

## 2015-07-09 MED ORDER — MONTELUKAST SODIUM 10 MG PO TABS: 10.0000 mg | ORAL_TABLET | Freq: Every day | ORAL | Status: DC

## 2015-07-09 MED ORDER — CYANOCOBALAMIN 1000 MCG/ML IJ SOLN: INTRAMUSCULAR | Status: DC

## 2015-07-09 NOTE — Telephone Encounter (Signed)
Can refill ABX if prophylaxis?

## 2015-07-10 ENCOUNTER — Other Ambulatory Visit: Payer: Self-pay | Admitting: *Deleted

## 2015-07-10 ENCOUNTER — Encounter: Payer: Self-pay | Admitting: *Deleted

## 2015-07-10 MED ORDER — PAROXETINE HCL 20 MG PO TABS: 20.0000 mg | ORAL_TABLET | Freq: Every day | ORAL | Status: DC

## 2015-07-10 NOTE — Telephone Encounter (Signed)
yes

## 2015-07-11 NOTE — Telephone Encounter (Signed)
Yes, please send 2 weeks supply to local pharmacy and a 90 day to mail order

## 2015-07-16 ENCOUNTER — Other Ambulatory Visit: Payer: Self-pay | Admitting: Internal Medicine

## 2015-08-29 ENCOUNTER — Ambulatory Visit (INDEPENDENT_AMBULATORY_CARE_PROVIDER_SITE_OTHER): Payer: Medicare Other | Admitting: Internal Medicine

## 2015-08-29 ENCOUNTER — Encounter: Payer: Self-pay | Admitting: Internal Medicine

## 2015-08-29 VITALS — BP 118/74 | HR 84 | Temp 97.6°F | Resp 12 | Ht 65.0 in | Wt 251.8 lb

## 2015-08-29 DIAGNOSIS — E034 Atrophy of thyroid (acquired): Secondary | ICD-10-CM

## 2015-08-29 DIAGNOSIS — E1169 Type 2 diabetes mellitus with other specified complication: Secondary | ICD-10-CM

## 2015-08-29 DIAGNOSIS — E785 Hyperlipidemia, unspecified: Secondary | ICD-10-CM

## 2015-08-29 DIAGNOSIS — Z1239 Encounter for other screening for malignant neoplasm of breast: Secondary | ICD-10-CM

## 2015-08-29 DIAGNOSIS — E559 Vitamin D deficiency, unspecified: Secondary | ICD-10-CM

## 2015-08-29 DIAGNOSIS — E119 Type 2 diabetes mellitus without complications: Secondary | ICD-10-CM | POA: Diagnosis not present

## 2015-08-29 DIAGNOSIS — Z803 Family history of malignant neoplasm of breast: Secondary | ICD-10-CM

## 2015-08-29 DIAGNOSIS — T452X1A Poisoning by vitamins, accidental (unintentional), initial encounter: Secondary | ICD-10-CM

## 2015-08-29 DIAGNOSIS — E669 Obesity, unspecified: Secondary | ICD-10-CM

## 2015-08-29 DIAGNOSIS — E038 Other specified hypothyroidism: Secondary | ICD-10-CM

## 2015-08-29 DIAGNOSIS — I10 Essential (primary) hypertension: Secondary | ICD-10-CM | POA: Diagnosis not present

## 2015-08-29 LAB — COMPREHENSIVE METABOLIC PANEL
ALT: 31 U/L (ref 0–35)
AST: 23 U/L (ref 0–37)
Albumin: 4.1 g/dL (ref 3.5–5.2)
Alkaline Phosphatase: 55 U/L (ref 39–117)
BUN: 17 mg/dL (ref 6–23)
CO2: 29 meq/L (ref 19–32)
Calcium: 9.5 mg/dL (ref 8.4–10.5)
Chloride: 102 mEq/L (ref 96–112)
Creatinine, Ser: 0.86 mg/dL (ref 0.40–1.20)
GFR: 67.99 mL/min (ref >60.00)
GLUCOSE: 97 mg/dL (ref 70–99)
POTASSIUM: 4.9 meq/L (ref 3.5–5.1)
Sodium: 139 mEq/L (ref 135–145)
Total Bilirubin: 0.5 mg/dL (ref 0.2–1.2)
Total Protein: 7.1 g/dL (ref 6.0–8.3)

## 2015-08-29 LAB — LIPID PANEL
CHOL/HDL RATIO: 5
CHOLESTEROL: 255 mg/dL — AB (ref 0–200)
HDL: 56 mg/dL (ref >39.00)
NonHDL: 198.61
TRIGLYCERIDES: 289 mg/dL — AB (ref 0.0–149.0)
VLDL: 57.8 mg/dL — AB (ref 0.0–40.0)

## 2015-08-29 LAB — VITAMIN D 25 HYDROXY (VIT D DEFICIENCY, FRACTURES): VITD: 89.72 ng/mL (ref 30.00–100.00)

## 2015-08-29 LAB — LDL CHOLESTEROL, DIRECT: Direct LDL: 170 mg/dL

## 2015-08-29 LAB — HEMOGLOBIN A1C: Hgb A1c MFr Bld: 5.7 % (ref 4.6–6.5)

## 2015-08-29 MED ORDER — MONTELUKAST SODIUM 10 MG PO TABS: 10.0000 mg | ORAL_TABLET | Freq: Every day | ORAL | Status: DC

## 2015-08-29 NOTE — Progress Notes (Signed)
Pre-visit discussion using our clinic review tool. No additional management support is needed unless otherwise documented below in the visit note.  

## 2015-08-29 NOTE — Patient Instructions (Signed)
  NO MORE APPLE FRITTERS!!   You might want to try a premixed protein drink called Premier Protein shake in the morning.  It is less $$$ and very low sugar.    160 cal  30 g protein  1 g sugar 50% calcium needs   DELICIOUS!!1

## 2015-08-29 NOTE — Progress Notes (Signed)
Subjective:  Patient ID: Brenda Floyd, female    DOB: 07-Jan-1941  Age: 75 y.o. MRN: [id number]  CC: The primary encounter diagnosis was Screening for breast cancer. Diagnoses of Vitamin D deficiency, Hyperlipidemia associated with type 2 diabetes mellitus ([place]), Diabetes mellitus without complication ([place]), Diabetes mellitus type 2 in obese [facility]), Essential hypertension, Hypothyroidism due to acquired atrophy of thyroid, Obesity (BMI 30-39.9), Family history of breast cancer, and Vitamin D toxicity, accidental or unintentional, initial encounter were also pertinent to this visit.  HPI Brenda Floyd presents for  3 month follow up on diabetes.  Patient has no complaints today.  Patient is following a low glycemic index diet and taking all prescribed medications regularly without side effects.  Fasting sugars have been under less than 140 most of the time and post prandials have been under 160 except on rare occasions. Patient is exercising about 3 times per week and intentionally trying to lose weight .  Patient has had an eye exam in the last 12 months and checks feet regularly for signs of infection.  Patient does not walk barefoot outside,  And denies an numbness tingling or burning in feet. Patient is up to date on all recommended vaccinations Foot exam normal   Stopped vit d in late march (was taking 50K daily)  Eye exam in august  Sister diagnosed with BRCA last year,,  Patient wants a 3 d mammogram   Outpatient Prescriptions Prior to Visit  Medication Sig Dispense Refill  . acetaminophen (TYLENOL) 325 MG tablet Take 650 mg by mouth as needed.      Marland Kitchen amoxicillin (AMOXIL) 500 MG capsule TAKE 4 CAPSULES BY MOUTH ONE HOUR BEFORE APPOINTMENT 4 capsule 0  . aspirin 81 MG tablet Take 1 tablet (81 mg total) by mouth daily. 30 tablet 11  . co-enzyme Q-10 30 MG capsule Take 1 capsule (30 mg total) by mouth daily. 90 capsule 3  . cyanocobalamin (,VITAMIN B-12,) 1000 MCG/ML injection 1 ml  injected monthly 10 mL 2  . fexofenadine (ALLEGRA) 180 MG tablet Take 180 mg by mouth daily.    Marland Kitchen glucose blood (FREESTYLE LITE) test strip Use to check blood sugars once daily,  Twice if needed   DM 250.00 102 each 3  . Insulin Glargine (LANTUS SOLOSTAR) 100 UNIT/ML Solostar Pen INJECT 22 UNITS UNDER THE SKIN DAILY 15 mL 6  . levothyroxine (SYNTHROID, LEVOTHROID) 75 MCG tablet TAKE 1 TABLET DAILY FOR HYPOTHYROIDISM 90 tablet 2  . lisinopril (PRINIVIL,ZESTRIL) 20 MG tablet TAKE ONE TABLET BY MOUTH ONCE DAILY 90 tablet 0  . magnesium gluconate (MAGONATE) 500 MG tablet Take 500 mg by mouth daily.    . meloxicam (MOBIC) 15 MG tablet Take 1 tablet (15 mg total) by mouth daily. 90 tablet 1  . metFORMIN (GLUCOPHAGE-XR) 750 MG 24 hr tablet TAKE 1 TABLET THREE TIMES A DAY 270 tablet 1  . PARoxetine (PAXIL) 20 MG tablet Take 1 tablet (20 mg total) by mouth daily. 30 tablet 0  . Syringe, Disposable, 1 ML MISC For use with b12 serum 25 each 2  . Syringe/Needle, Disp, (SYRINGE 3CC/25GX1") 25G X 1" 3 ML MISC Use as directed 50 each 0  . traMADol (ULTRAM) 50 MG tablet TAKE ONE TABLET BY MOUTH 4 TIMES DAILY AS NEEDED FOR KNEE PAIN 120 tablet 5  . TURMERIC PO Take by mouth daily.    . montelukast (SINGULAIR) 10 MG tablet Take 1 tablet (10 mg total) by mouth at bedtime. 30 tablet  3  . Cholecalciferol (VITAMIN D3) 5000 UNITS CAPS Take 1 tablet by mouth daily. Reported on 08/29/2015     No facility-administered medications prior to visit.    Review of Systems;  Patient denies headache, fevers, malaise, unintentional weight loss, skin rash, eye pain, sinus congestion and sinus pain, sore throat, dysphagia,  hemoptysis , cough, dyspnea, wheezing, chest pain, palpitations, orthopnea, edema, abdominal pain, nausea, melena, diarrhea, constipation, flank pain, dysuria, hematuria, urinary  Frequency, nocturia, numbness, tingling, seizures,  Focal weakness, Loss of consciousness,  Tremor, insomnia, depression, anxiety,  and suicidal ideation.      Objective:  BP 118/74 mmHg  Pulse 84  Temp(Src) 97.6 F (36.4 C) (Oral)  Resp 12  Ht '5\' 5"'  (1.651 m)  Wt 251 lb 12 oz (114.193 kg)  BMI 41.89 kg/m2  SpO2 97%  BP Readings from Last 3 Encounters:  08/29/15 118/74  05/22/15 140/80  04/29/15 120/80    Wt Readings from Last 3 Encounters:  08/29/15 251 lb 12 oz (114.193 kg)  05/22/15 250 lb (113.399 kg)  04/29/15 250 lb 6 oz (113.569 kg)    General appearance: alert, cooperative and appears stated age Ears: normal TM's and external ear canals both ears Throat: lips, mucosa, and tongue normal; teeth and gums normal Neck: no adenopathy, no carotid bruit, supple, symmetrical, trachea midline and thyroid not enlarged, symmetric, no tenderness/mass/nodules Back: symmetric, no curvature. ROM normal. No CVA tenderness. Lungs: clear to auscultation bilaterally Heart: regular rate and rhythm, S1, S2 normal, no murmur, click, rub or gallop Abdomen: soft, non-tender; bowel sounds normal; no masses,  no organomegaly Pulses: 2+ and symmetric Skin: Skin color, texture, turgor normal. No rashes or lesions Lymph nodes: Cervical, supraclavicular, and axillary nodes normal.  Lab Results  Component Value Date   HGBA1C 5.7 08/29/2015   HGBA1C 5.9 05/09/2015   HGBA1C 5.6 01/29/2015    Lab Results  Component Value Date   CREATININE 0.86 08/29/2015   CREATININE 0.78 05/09/2015   CREATININE 0.86 01/29/2015    Lab Results  Component Value Date   WBC 5.3 08/08/2012   HGB 13.5 08/08/2012   HCT 41.2 08/08/2012   PLT 216.0 08/08/2012   GLUCOSE 97 08/29/2015   CHOL 255* 08/29/2015   TRIG 289.0* 08/29/2015   HDL 56.00 08/29/2015   LDLDIRECT 170.0 08/29/2015   LDLCALC 167* 05/26/2013   ALT 31 08/29/2015   AST 23 08/29/2015   NA 139 08/29/2015   K 4.9 08/29/2015   CL 102 08/29/2015   CREATININE 0.86 08/29/2015   BUN 17 08/29/2015   CO2 29 08/29/2015   TSH 2.36 05/09/2015   HGBA1C 5.7 08/29/2015    MICROALBUR 0.7 05/09/2015    Dg Bone Density  08/15/2014  EXAM: DUAL X-RAY ABSORPTIOMETRY (DXA) FOR BONE MINERAL DENSITY IMPRESSION: Dear Dr Deborra Medina, Your patient [name] completed a BMD test on 08/15/2014 using the [place] (analysis version: 14.10) manufactured by [employer]. The following summarizes the results of our evaluation. PATIENT BIOGRAPHICAL: Name: LUCIANNE, SMESTAD Patient ID: [id number] Birth Date: 30-Sep-1940 Height: 64.0 in. Gender: Female Exam Date: 08/15/2014 Weight: 252.6 lbs. Indications: Advanced Age, Caucasian, Family History of Fracture, Family Hx of Osteoporosis, Height Loss, History of Fracture (Adult), Hysterectomy, Osteoarthritis, POSTmenopausal Fractures: Spine, Tib/Fib Treatments: 81 MG ASPIRIN, multivitamin, synthroid, Vitamin D ASSESSMENT: The BMD measured at Femur Neck Right is 0.733 g/cm2 with a T-score of -2.2. This patient is considered osteopenic according to [facility]) criteria. Site Region Measured Measured WHO  Young Adult BMD Date       Age      Classification T-score DualFemur Neck Right 08/15/2014 [age] Osteopenia -2.2 0.733 g/cm2 AP Spine L2-L4 08/15/2014 76.0 Osteopenia -1.9 0.981 g/cm2 World Health Organization [facility]) criteria for post-menopausal, Caucasian Women: Normal:       T-score at or above -1 SD Osteopenia:   T-score between -1 and -2.5 SD Osteoporosis: T-score at or below -2.5 SD L1 was excluded due to  compression fracture. RECOMMENDATIONS: National Osteoporosis Foundation recommends that FDA-approved medical therapies be considered in postemenopausal women and men age 63 or older with a: 1. Hip or vertebral (clinical or morphometric) fracture. 2. T-score of < -2.5at the spine or hip. 3. Ten-year fracture probability by FRAX of 3% or greater for hip fracture or 20% or greater for major osteoporotic fracture. All treatment decisions require clinical judgment and consideration of individual patient  factors, including patient preferences, co-morbidities, previous drug use, risk factors not captured in the FRAX model (e.g. falls, vitamin D deficiency, increased bone turnover, interval significant decline in bone density) and possible under - or over-estimation of fracture risk by FRAX. All patients should ensure an adequate intake of dietary calcium (1200 mg/d) and vitamin D (800 IU daily) unless contraindicated. FOLLOW-UP: People with diagnosed cases of osteoporosis or at high risk for fracture should have regular bone mineral density tests. For patients eligible for Medicare, routine testing is allowed once every 2 years. The testing frequency can be increased to one year for patients who have rapidly progressing disease, those who are receiving or discontinuing medical therapy to restore bone mass, or have additional risk factors. I have reviewed this report, and agree with the above findings. [facility] Dear Dr Deborra Medina, Your patient AHNI BRADWELL completed a FRAX assessment on 08/15/2014 using the [place] (analysis version: 14.10) manufactured by [employer]. The following summarizes the results of our evaluation. PATIENT BIOGRAPHICAL: Name: VANGIE, HENTHORN Patient ID: [id number] Birth Date: 22-Dec-1940 Height:    64.0 in. Gender:     Female    [age]       Weight:    252.6 lbs. Ethnicity:  White                            Exam Date: 08/15/2014 FRAX* RESULTS:  (version: 3.5) 10-year Probability of Fracture1 Major Osteoporotic Fracture2 Hip Fracture 19.1% 4.7% Population: [place] (Caucasian) Risk Factors: History of Fracture (Adult) Based on Femur (Right) Neck BMD 1 -The 10-year probability of fracture may be lower than reported if the patient has received treatment. 2 -Major Osteoporotic Fracture: Clinical Spine, Forearm, Hip or Shoulder *FRAX is a Materials engineer of the [employer] for Metabolic Bone Disease, a [place] (WHO) [facility]. ASSESSMENT: The probability of a major osteoporotic fracture is 19.1 %within the next ten years. The probability of a hip fracture is 4.7 %within the next ten years. Electronically Signed   By: David  Martinique M.D.   On: 08/15/2014 10:59   Mm Digital Screening Bilateral  08/15/2014  CLINICAL DATA:  Screening. EXAM: DIGITAL SCREENING BILATERAL MAMMOGRAM WITH CAD COMPARISON:  Previous exam(s). ACR Breast Density Category c: The breast tissue is heterogeneously dense, which may obscure small masses. FINDINGS: There are no findings suspicious for malignancy. Images were processed with CAD. IMPRESSION: No mammographic evidence of malignancy. A result letter of this screening mammogram  will be mailed directly to the patient. RECOMMENDATION: Screening mammogram in one year. (Code:SM-B-01Y) BI-RADS CATEGORY  1: Negative. Electronically Signed   By: Lovey Newcomer M.D.   On: 08/15/2014 11:10    Assessment & Plan:   Problem List Items Addressed This Visit    Vitamin D toxicity    Secondary to supratherapuetic doses of Vit D ,  Rechecking  level today, which is now in normal range .  Continue to suspend supplements.       Family history of breast cancer    3d mammogram to be ordered for annual screen      Diabetes mellitus type 2 in obese ([place])     well-controlled on 22 units of Lantus .  hemoglobin A1c has been consistently at or  less than 6.0 . Patient is up-to-date on eye exams and foot exam is normal today except for pes planus. . Patient ihas no evidence of  microalbuminuria Patient is tolerating aspirin and red yeast rice  for CAD risk reduction and is already on on ACE/ARB .  Lab Results  Component Value Date   HGBA1C 5.7 08/29/2015   Lab Results  Component Value Date   MICROALBUR 0.7 05/09/2015                    Essential hypertension    Well controlled on current regimen. Renal function stable, no changes today.  Lab Results  Component  Value Date   CREATININE 0.86 08/29/2015   Lab Results  Component Value Date   NA 139 08/29/2015   K 4.9 08/29/2015   CL 102 08/29/2015   CO2 29 08/29/2015         Obesity (BMI 30-39.9)    I have addressed  BMI and recommended wt loss of 10% of body weigh over the next 6 months using a low glycemic index diet and regular exercise a minimum of 5 days per week.        Hypothyroidism due to acquired atrophy of thyroid    Thyroid function is WNL on current dose.  No current changes needed.   Lab Results  Component Value Date   TSH 2.36 05/09/2015             Other Visit Diagnoses    Screening for breast cancer    -  Primary    Relevant Orders    MM DIGITAL SCREENING BILATERAL    Vitamin D deficiency        Relevant Orders    VITAMIN D 25 Hydroxy (Vit-D Deficiency, Fractures) (Completed)    Hyperlipidemia associated with type 2 diabetes mellitus ([place])        Relevant Orders    LDL cholesterol, direct (Completed)    Lipid panel (Completed)    Diabetes mellitus without complication ([facility])        Relevant Orders    Comprehensive metabolic panel (Completed)    Hemoglobin A1c (Completed)       I am having Ms. Verge maintain her Vitamin D3, acetaminophen, aspirin, glucose blood, co-enzyme Q-10, TURMERIC PO, Syringe (Disposable), SYRINGE 3CC/25GX1", fexofenadine, magnesium gluconate, levothyroxine, traMADol, metFORMIN, Insulin Glargine, meloxicam, amoxicillin, cyanocobalamin, PARoxetine, lisinopril, and montelukast.  Meds ordered this encounter  Medications  . montelukast (SINGULAIR) 10 MG tablet    Sig: Take 1 tablet (10 mg total) by mouth at bedtime.    Dispense:  90 tablet    Refill:  3    Medications Discontinued During This Encounter  Medication Reason  .  montelukast (SINGULAIR) 10 MG tablet Reorder    Follow-up: Return in about 3 months (around 11/29/2015) for follow up diabetes.   Crecencio Mc, MD

## 2015-08-31 DIAGNOSIS — Z803 Family history of malignant neoplasm of breast: Secondary | ICD-10-CM | POA: Insufficient documentation

## 2015-08-31 NOTE — Assessment & Plan Note (Signed)
I have addressed  BMI and recommended wt loss of 10% of body weigh over the next 6 months using a low glycemic index diet and regular exercise a minimum of 5 days per week.   

## 2015-08-31 NOTE — Assessment & Plan Note (Signed)
Well controlled on current regimen. Renal function stable, no changes today.  Lab Results  Component Value Date   CREATININE 0.86 08/29/2015   Lab Results  Component Value Date   NA 139 08/29/2015   K 4.9 08/29/2015   CL 102 08/29/2015   CO2 29 08/29/2015

## 2015-08-31 NOTE — Assessment & Plan Note (Signed)
Thyroid function is WNL on current dose.  No current changes needed.   Lab Results  Component Value Date   TSH 2.36 05/09/2015

## 2015-08-31 NOTE — Assessment & Plan Note (Signed)
well-controlled on 22 units of Lantus .  hemoglobin A1c has been consistently at or  less than 6.0 . Patient is up-to-date on eye exams and foot exam is normal today except for pes planus. . Patient ihas no evidence of  microalbuminuria Patient is tolerating aspirin and red yeast rice  for CAD risk reduction and is already on on ACE/ARB .  Lab Results  Component Value Date   HGBA1C 5.7 08/29/2015   Lab Results  Component Value Date   MICROALBUR 0.7 05/09/2015

## 2015-09-01 ENCOUNTER — Encounter: Payer: Self-pay | Admitting: Internal Medicine

## 2015-09-01 NOTE — Assessment & Plan Note (Signed)
3d mammogram to be ordered for annual screen

## 2015-09-01 NOTE — Assessment & Plan Note (Addendum)
Secondary to supratherapuetic doses of Vit D ,  Rechecking  level today, which is now in normal range .  Continue to suspend supplements.

## 2015-09-15 ENCOUNTER — Other Ambulatory Visit: Payer: Self-pay | Admitting: Internal Medicine

## 2015-10-02 ENCOUNTER — Ambulatory Visit
Admission: RE | Admit: 2015-10-02 | Discharge: 2015-10-02 | Disposition: A | Discharge status: Home / Self Care | Payer: Medicare Other | Source: Ambulatory Visit | Attending: Internal Medicine | Admitting: Internal Medicine

## 2015-10-02 ENCOUNTER — Other Ambulatory Visit: Payer: Self-pay | Admitting: Internal Medicine

## 2015-10-02 DIAGNOSIS — Z1239 Encounter for other screening for malignant neoplasm of breast: Secondary | ICD-10-CM

## 2015-10-02 DIAGNOSIS — Z1231 Encounter for screening mammogram for malignant neoplasm of breast: Secondary | ICD-10-CM

## 2015-10-15 DIAGNOSIS — D122 Benign neoplasm of ascending colon: Secondary | ICD-10-CM | POA: Diagnosis not present

## 2015-10-15 DIAGNOSIS — K648 Other hemorrhoids: Secondary | ICD-10-CM | POA: Diagnosis not present

## 2015-10-15 DIAGNOSIS — D126 Benign neoplasm of colon, unspecified: Secondary | ICD-10-CM | POA: Diagnosis not present

## 2015-10-15 DIAGNOSIS — Z86010 Personal history of colonic polyps: Secondary | ICD-10-CM | POA: Diagnosis not present

## 2015-10-15 DIAGNOSIS — K640 First degree hemorrhoids: Secondary | ICD-10-CM | POA: Diagnosis not present

## 2015-10-15 DIAGNOSIS — D123 Benign neoplasm of transverse colon: Secondary | ICD-10-CM | POA: Diagnosis not present

## 2015-10-15 DIAGNOSIS — K644 Residual hemorrhoidal skin tags: Secondary | ICD-10-CM | POA: Diagnosis not present

## 2015-10-16 ENCOUNTER — Other Ambulatory Visit: Payer: Self-pay | Admitting: Internal Medicine

## 2015-10-30 ENCOUNTER — Other Ambulatory Visit: Payer: Self-pay | Admitting: Internal Medicine

## 2015-10-30 NOTE — Telephone Encounter (Signed)
Ok to refill tramadol last fill 12/16 for 120 with 4 refills, last OV 7/17.

## 2015-11-04 LAB — HM DIABETES EYE EXAM

## 2015-11-26 ENCOUNTER — Other Ambulatory Visit: Payer: Self-pay | Admitting: Internal Medicine

## 2015-12-02 ENCOUNTER — Encounter: Payer: Self-pay | Admitting: Internal Medicine

## 2015-12-02 ENCOUNTER — Ambulatory Visit (INDEPENDENT_AMBULATORY_CARE_PROVIDER_SITE_OTHER): Payer: Medicare Other

## 2015-12-02 ENCOUNTER — Ambulatory Visit (INDEPENDENT_AMBULATORY_CARE_PROVIDER_SITE_OTHER): Payer: Medicare Other | Admitting: Internal Medicine

## 2015-12-02 VITALS — BP 130/82 | HR 77 | Temp 98.0°F | Resp 12 | Ht 65.0 in | Wt 245.8 lb

## 2015-12-02 DIAGNOSIS — M25512 Pain in left shoulder: Secondary | ICD-10-CM | POA: Diagnosis not present

## 2015-12-02 DIAGNOSIS — G8929 Other chronic pain: Secondary | ICD-10-CM | POA: Diagnosis not present

## 2015-12-02 DIAGNOSIS — E669 Obesity, unspecified: Secondary | ICD-10-CM

## 2015-12-02 DIAGNOSIS — T887XXA Unspecified adverse effect of drug or medicament, initial encounter: Secondary | ICD-10-CM

## 2015-12-02 DIAGNOSIS — T50905A Adverse effect of unspecified drugs, medicaments and biological substances, initial encounter: Secondary | ICD-10-CM

## 2015-12-02 DIAGNOSIS — R197 Diarrhea, unspecified: Secondary | ICD-10-CM | POA: Diagnosis not present

## 2015-12-02 DIAGNOSIS — M25511 Pain in right shoulder: Secondary | ICD-10-CM

## 2015-12-02 DIAGNOSIS — E119 Type 2 diabetes mellitus without complications: Secondary | ICD-10-CM

## 2015-12-02 DIAGNOSIS — E1169 Type 2 diabetes mellitus with other specified complication: Secondary | ICD-10-CM

## 2015-12-02 DIAGNOSIS — E785 Hyperlipidemia, unspecified: Secondary | ICD-10-CM

## 2015-12-02 DIAGNOSIS — I10 Essential (primary) hypertension: Secondary | ICD-10-CM

## 2015-12-02 DIAGNOSIS — T452X1S Poisoning by vitamins, accidental (unintentional), sequela: Secondary | ICD-10-CM

## 2015-12-02 DIAGNOSIS — M19012 Primary osteoarthritis, left shoulder: Secondary | ICD-10-CM | POA: Diagnosis not present

## 2015-12-02 DIAGNOSIS — R2689 Other abnormalities of gait and mobility: Secondary | ICD-10-CM

## 2015-12-02 LAB — COMPREHENSIVE METABOLIC PANEL
ALBUMIN: 4.3 g/dL (ref 3.5–5.2)
ALT: 17 U/L (ref 0–35)
AST: 14 U/L (ref 0–37)
Alkaline Phosphatase: 54 U/L (ref 39–117)
BILIRUBIN TOTAL: 0.4 mg/dL (ref 0.2–1.2)
BUN: 21 mg/dL (ref 6–23)
CALCIUM: 9.6 mg/dL (ref 8.4–10.5)
CO2: 27 meq/L (ref 19–32)
CREATININE: 0.83 mg/dL (ref 0.40–1.20)
Chloride: 104 mEq/L (ref 96–112)
GFR: 70.79 mL/min (ref >60.00)
Glucose, Bld: 97 mg/dL (ref 70–99)
Potassium: 4.5 mEq/L (ref 3.5–5.1)
Sodium: 137 mEq/L (ref 135–145)
Total Protein: 7 g/dL (ref 6.0–8.3)

## 2015-12-02 LAB — HEMOGLOBIN A1C: HEMOGLOBIN A1C: 5.6 % (ref 4.6–6.5)

## 2015-12-02 NOTE — Progress Notes (Signed)
Subjective:  Patient ID: Brenda Floyd, female    DOB: [birth year]  Age: 75 y.o. MRN: [id number]  CC: There were no encounter diagnoses.  HPI Brenda Floyd presents for follow up on type 2 DM , obesity and DJD  Obesity:  Has modified diet and lost 6 lbs.  Walking more now that the right foot pain due to plantar wort  has resolved with otc therapy.  Recent fall:  She has finished PT  For lack of balance and improvement in Core strength .   Allergic rhinitis:  Singulair helped with the rhinitis but it caused insomnia. If she took it at night. Using Flonase in the am for seasonal rhinitis and allegra.   Left shoulder pain x several months , painful with ll ROM  Causing  Pain over deltoid region  And sometimes over the scaular region , using a massager . Already taking mobic for DJD knee.   Chronic diarrhea:  Reports Loose stools occurring post prandially after every .  Occurs several times daily for 2-3 days,  Then resolves for a week or so before recurring again.  No abdominal cramping, fevers,  Mucoid discharge r blood in stools.   Started before her last  colonoscopy  In July, which was normal except for a tubular adenoma,  Follow up 3 years advised. Has been taking a probiotic daily for several months .  Wears gloves and mask when cleaning out her chicken coops.   Declined flu vaccine      Outpatient Medications Prior to Visit  Medication Sig Dispense Refill  . acetaminophen (TYLENOL) 325 MG tablet Take 650 mg by mouth as needed.      Marland Kitchen amoxicillin (AMOXIL) 500 MG capsule TAKE 4 CAPSULES BY MOUTH ONE HOUR BEFORE APPOINTMENT 4 capsule 0  . aspirin 81 MG tablet Take 1 tablet (81 mg total) by mouth daily. 30 tablet 11  . co-enzyme Q-10 30 MG capsule Take 1 capsule (30 mg total) by mouth daily. 90 capsule 3  . cyanocobalamin (,VITAMIN B-12,) 1000 MCG/ML injection 1 ml injected monthly 10 mL 2  . fexofenadine (ALLEGRA) 180 MG tablet Take 180 mg by mouth daily.    Marland Kitchen glucose blood  (FREESTYLE LITE) test strip Use to check blood sugars once daily,  Twice if needed   DM 250.00 102 each 3  . Insulin Glargine (LANTUS SOLOSTAR) 100 UNIT/ML Solostar Pen INJECT 22 UNITS UNDER THE SKIN DAILY 15 mL 6  . levothyroxine (SYNTHROID, LEVOTHROID) 75 MCG tablet TAKE 1 TABLET DAILY FOR HYPOTHYROIDISM 90 tablet 1  . lisinopril (PRINIVIL,ZESTRIL) 20 MG tablet TAKE ONE TABLET BY MOUTH ONCE DAILY 90 tablet 0  . magnesium gluconate (MAGONATE) 500 MG tablet Take 500 mg by mouth daily.    . meloxicam (MOBIC) 15 MG tablet Take 1 tablet (15 mg total) by mouth daily. 90 tablet 1  . metFORMIN (GLUCOPHAGE-XR) 750 MG 24 hr tablet TAKE 1 TABLET THREE TIMES A DAY 270 tablet 1  . montelukast (SINGULAIR) 10 MG tablet Take 1 tablet (10 mg total) by mouth at bedtime. 90 tablet 3  . PARoxetine (PAXIL) 20 MG tablet Take 1 tablet (20 mg total) by mouth daily. 30 tablet 0  . Syringe, Disposable, 1 ML MISC For use with b12 serum 25 each 2  . Syringe/Needle, Disp, (SYRINGE 3CC/25GX1") 25G X 1" 3 ML MISC Use as directed 50 each 0  . traMADol (ULTRAM) 50 MG tablet TAKE ONE TABLET BY MOUTH 4 TIMES DAILY AS NEEDED FOR  KNEE PAIN 120 tablet 5  . TURMERIC PO Take by mouth daily.    . Cholecalciferol (VITAMIN D3) 5000 UNITS CAPS Take 1 tablet by mouth daily. Reported on 08/29/2015     No facility-administered medications prior to visit.     Review of Systems;  Patient denies headache, fevers, malaise, unintentional weight loss, skin rash, eye pain, sinus congestion and sinus pain, sore throat, dysphagia,  hemoptysis , cough, dyspnea, wheezing, chest pain, palpitations, orthopnea, edema, abdominal pain, nausea, melena, diarrhea, constipation, flank pain, dysuria, hematuria, urinary  Frequency, nocturia, numbness, tingling, seizures,  Focal weakness, Loss of consciousness,  Tremor, insomnia, depression, anxiety, and suicidal ideation.      Objective:  BP 130/82   Pulse 77   Temp 98 F (36.7 C) (Oral)   Resp 12   Ht  5\' 5"  (1.651 m)   Wt 245 lb 12 oz (111.5 kg)   SpO2 96%   BMI 40.89 kg/m   BP Readings from Last 3 Encounters:  12/02/15 130/82  08/29/15 118/74  05/22/15 140/80    Wt Readings from Last 3 Encounters:  12/02/15 245 lb 12 oz (111.5 kg)  08/29/15 251 lb 12 oz (114.2 kg)  05/22/15 250 lb (113.4 kg)    General appearance: alert, cooperative and appears stated age Ears: normal TM's and external ear canals both ears Throat: lips, mucosa, and tongue normal; teeth and gums normal Neck: no adenopathy, no carotid bruit, supple, symmetrical, trachea midline and thyroid not enlarged, symmetric, no tenderness/mass/nodules Back: symmetric, no curvature. ROM normal. No CVA tenderness. Lungs: clear to auscultation bilaterally Heart: regular rate and rhythm, S1, S2 normal, no murmur, click, rub or gallop Abdomen: soft, non-tender; bowel sounds normal; no masses,  no organomegaly Pulses: 2+ and symmetric Skin: Skin color, texture, turgor normal. No rashes or lesions Lymph nodes: Cervical, supraclavicular, and axillary nodes normal.  Lab Results  Component Value Date   HGBA1C 5.7 08/29/2015   HGBA1C 5.9 05/09/2015   HGBA1C 5.6 01/29/2015    Lab Results  Component Value Date   CREATININE 0.86 08/29/2015   CREATININE 0.78 05/09/2015   CREATININE 0.86 01/29/2015    Lab Results  Component Value Date   WBC 5.3 08/08/2012   HGB 13.5 08/08/2012   HCT 41.2 08/08/2012   PLT 216.0 08/08/2012   GLUCOSE 97 08/29/2015   CHOL 255 (H) 08/29/2015   TRIG 289.0 (H) 08/29/2015   HDL 56.00 08/29/2015   LDLDIRECT 170.0 08/29/2015   LDLCALC 167 (H) 05/26/2013   ALT 31 08/29/2015   AST 23 08/29/2015   NA 139 08/29/2015   K 4.9 08/29/2015   CL 102 08/29/2015   CREATININE 0.86 08/29/2015   BUN 17 08/29/2015   CO2 29 08/29/2015   TSH 2.36 05/09/2015   HGBA1C 5.7 08/29/2015   MICROALBUR 0.7 05/09/2015    Mm Screening Breast Tomo Bilateral  Result Date: 10/03/2015 CLINICAL DATA:  Screening.  EXAM: 2D DIGITAL SCREENING BILATERAL MAMMOGRAM WITH CAD AND ADJUNCT TOMO COMPARISON:  Previous exam(s). ACR Breast Density Category c: The breast tissue is heterogeneously dense, which may obscure small masses. FINDINGS: There are no findings suspicious for malignancy. Images were processed with CAD. IMPRESSION: No mammographic evidence of malignancy. A result letter of this screening mammogram will be mailed directly to the patient. RECOMMENDATION: Screening mammogram in one year. (Code:SM-B-01Y) BI-RADS CATEGORY  1: Negative. Electronically Signed   By: Ammie Ferrier M.D.   On: 10/03/2015 09:33    Assessment & Plan:   Problem List Items  Addressed This Visit    None    Visit Diagnoses   None.     I am having Ms. Sparrow maintain her Vitamin D3, acetaminophen, aspirin, glucose blood, co-enzyme Q-10, TURMERIC PO, Syringe (Disposable), SYRINGE 3CC/25GX1", fexofenadine, magnesium gluconate, Insulin Glargine, meloxicam, amoxicillin, cyanocobalamin, PARoxetine, montelukast, levothyroxine, lisinopril, traMADol, and metFORMIN.  No orders of the defined types were placed in this encounter.   There are no discontinued medications.  Follow-up: No Follow-up on file.   Crecencio Mc, MD

## 2015-12-02 NOTE — Patient Instructions (Signed)
Suspend your metformin to see if the diarrhea resolves  If not,  Collect a stool sample (liquid only) and return in the vials given to you today

## 2015-12-02 NOTE — Progress Notes (Signed)
Pre-visit discussion using our clinic review tool. No additional management support is needed unless otherwise documented below in the visit note.  

## 2015-12-03 ENCOUNTER — Encounter: Payer: Self-pay | Admitting: Internal Medicine

## 2015-12-03 DIAGNOSIS — E119 Type 2 diabetes mellitus without complications: Secondary | ICD-10-CM | POA: Insufficient documentation

## 2015-12-03 DIAGNOSIS — R197 Diarrhea, unspecified: Secondary | ICD-10-CM | POA: Insufficient documentation

## 2015-12-03 NOTE — Assessment & Plan Note (Signed)
improved with PT focusing on improving core strength,  No recent falls.

## 2015-12-03 NOTE — Assessment & Plan Note (Signed)
The diarrhea may be due to metformin .  Advised to suspend it for 2 weeks

## 2015-12-03 NOTE — Assessment & Plan Note (Signed)
Well controlled on current regimen. Renal function stable, no changes today.  Lab Results  Component Value Date   CREATININE 0.83 12/02/2015   Lab Results  Component Value Date   NA 137 12/02/2015   K 4.5 12/02/2015   CL 104 12/02/2015   CO2 27 12/02/2015

## 2015-12-03 NOTE — Assessment & Plan Note (Signed)
Secondary to supratherapuetic doses of Vit D ,  Repeat level was normal.  Will recheck with next lab sin 3 months

## 2015-12-03 NOTE — Assessment & Plan Note (Signed)
I have congratulated her in reduction of   BMI and encouraged  Continued weight loss with goal of 10% of body weight over the next 6 months using a low glycemic index diet and regular exercise a minimum of 5 days per week.     

## 2015-12-03 NOTE — Assessment & Plan Note (Signed)
well-controlled on 22 units of Lantusand metformin.   hemoglobin A1c has been consistently at or  less than 6.0 . Patient is up-to-date on eye exams and foot exam is normal today except for pes planus. . Patient ihas no evidence of  Microalbuminuria.  Patient is tolerating aspirin and red yeast rice  for CAD risk reduction and is already on on ACE/ARB .Advised to suspend metformin gi en recurrent episodes of diarrhea.   Lab Results  Component Value Date   HGBA1C 5.6 12/02/2015   Lab Results  Component Value Date   MICROALBUR 0.7 05/09/2015

## 2015-12-03 NOTE — Assessment & Plan Note (Signed)
chronic episodes,  recurrent.  Stool culture and testing ordered if the diarrhea is not resolved with suspension of metformin.   Consider small bowel overgrowth given history of DM.

## 2015-12-03 NOTE — Assessment & Plan Note (Signed)
Managed with red yeast rice . Based on current lipid profile, the risk of clinically significant CAD is 22% over the next 10 years, using the Framingham risk calculator. The [employer] recommends starting patients aged 77 or higher on moderate intensity statin therapy for LDL between 70-189 and 10 yr risk of CAD > 7.5% ;  and high intensity therapy for anyone with LDL > 190. . Does not want to resume lipitor due to concern about potential side effects .  Taking an aspirin.   Lab Results  Component Value Date   CHOL 255 (H) 08/29/2015   HDL 56.00 08/29/2015   LDLCALC 167 (H) 05/26/2013   LDLDIRECT 170.0 08/29/2015   TRIG 289.0 (H) 08/29/2015   CHOLHDL 5 08/29/2015

## 2015-12-04 ENCOUNTER — Other Ambulatory Visit: Payer: Self-pay | Admitting: Internal Medicine

## 2015-12-04 DIAGNOSIS — G8929 Other chronic pain: Secondary | ICD-10-CM

## 2015-12-04 DIAGNOSIS — M25512 Pain in left shoulder: Principal | ICD-10-CM

## 2015-12-05 ENCOUNTER — Other Ambulatory Visit: Payer: Medicare Other

## 2015-12-05 DIAGNOSIS — R197 Diarrhea, unspecified: Secondary | ICD-10-CM | POA: Diagnosis not present

## 2015-12-06 LAB — FECAL LACTOFERRIN, QUANT: Lactoferrin: NEGATIVE

## 2015-12-09 LAB — STOOL CULTURE

## 2015-12-10 ENCOUNTER — Encounter: Payer: Self-pay | Admitting: Internal Medicine

## 2015-12-10 ENCOUNTER — Telehealth: Payer: Self-pay | Admitting: Internal Medicine

## 2015-12-10 LAB — GIARDIA/CRYPTOSPORIDIUM (EIA)

## 2015-12-12 ENCOUNTER — Encounter: Payer: Self-pay | Admitting: Internal Medicine

## 2015-12-12 DIAGNOSIS — M19012 Primary osteoarthritis, left shoulder: Secondary | ICD-10-CM | POA: Diagnosis not present

## 2015-12-14 MED ORDER — "INSULIN SYRINGE/NEEDLE 28G X 1/2"" 1 ML MISC": 1.0000 | Freq: Every day | 0 refills | Status: DC

## 2015-12-14 MED ORDER — INSULIN REGULAR HUMAN 100 UNIT/ML IJ SOLN: 10.0000 [IU] | Freq: Three times a day (TID) | INTRAMUSCULAR | 0 refills | Status: DC

## 2015-12-16 ENCOUNTER — Telehealth: Payer: Self-pay | Admitting: Internal Medicine

## 2015-12-16 NOTE — Telephone Encounter (Signed)
I called pt and left a vm to call the office to sch AWV. thank you!

## 2015-12-18 ENCOUNTER — Encounter: Payer: Self-pay | Admitting: Physical Therapy

## 2015-12-18 ENCOUNTER — Ambulatory Visit: Payer: Medicare Other | Attending: Orthopedic Surgery | Admitting: Physical Therapy

## 2015-12-18 DIAGNOSIS — M25512 Pain in left shoulder: Secondary | ICD-10-CM | POA: Insufficient documentation

## 2015-12-18 DIAGNOSIS — R293 Abnormal posture: Secondary | ICD-10-CM | POA: Insufficient documentation

## 2015-12-18 DIAGNOSIS — M25612 Stiffness of left shoulder, not elsewhere classified: Secondary | ICD-10-CM

## 2015-12-18 DIAGNOSIS — M6281 Muscle weakness (generalized): Secondary | ICD-10-CM | POA: Diagnosis not present

## 2015-12-18 DIAGNOSIS — G8929 Other chronic pain: Secondary | ICD-10-CM

## 2015-12-19 NOTE — Therapy (Addendum)
[facility] [address] [place], [place], [address] Phone: [phone]   Fax:  [phone]  Physical Therapy Evaluation  Patient Details  Name: Brenda Floyd MRN: [id number] Date of Birth: 06-19-1940 Referring Provider: Reche Dixon, PA-C  Encounter Date: 12/18/2015  Visit: 1 of 8  Past Medical History:  Diagnosis Date  . Cancer ([place])    skin ca  . Colon polyps 2012   [facility], followup due 2015  . Diabetes mellitus   . Diverticulosis of sigmoid colon July 2012   by colonoscopy  . Fracture closed, femur, shaft ([place]) remote   s/p screws and rod repair  . Hypertension   . Thyroid disease     Past Surgical History:  Procedure Laterality Date  . ABDOMINAL HYSTERECTOMY    . BREAST CYST ASPIRATION Left    neg  . JOINT REPLACEMENT     bilateral knee, 2011    There were no vitals filed for this visit.           [facility] PT Assessment - 12/23/15 0001      Assessment   Medical Diagnosis L shoulder pain (chronic)   Referring Provider Reche Dixon, PA-C   Onset Date/Surgical Date 02/10/15   Prior Therapy yes, known well to PT clinic.        No c/o L shoulder pain at rest since injection.  2-3/10 with L shoulder flexion and >6/10 with L side sleeping.     See HEP.    Pt. is a pleasant 75 y/o female with chronic h/o L shoulder pain/ joint stiffness.  Pt. reports no pain at rest and increase pain with overhead reaching.  R shoulder AROM WNL.  L shoulder AROM (supine/ sitting):  flexion (132 deg./ 129 deg.), abd. (122 deg./92 deg.), ER (53 deg.), IR (80 deg.).    Grip strength: L (43#), R (54#).  Pt. presents with forward head/rounded sh. posture with tightness in B pec. minors.  Pt. has min. hypomobility noted in L shoulder capsule and several small trigger points noted in B UT region.  Pt. will benefit from skilled PT services to increase L shoulder ROM/ strength to improve pain-free mobility.          PT Education -  12/23/15 1202    Education provided Yes   Education Details L shoulder HEP  (pulley flexion and abd., Supine wand flexion and press-ups, standing B shoulder ER/scap. retraction with RTB).     Person(s) Educated Patient   Methods Explanation;Demonstration;Handout   Comprehension Verbalized understanding;Returned demonstration;Verbal cues required             PT Long Term Goals - 12/23/15 1210      PT LONG TERM GOAL #1   Title Pt. independent with HEP to increase L shoulder AROM to [facility] as compared to R shoulder to improve pain-free mobility.    Baseline R shoulder AROM WNL.  L shoulder AROM (supine/ sitting):  flexion (132 deg./ 129 deg.), abd. (122 deg./92 deg.), ER (53 deg.), IR (80 deg.).    Grip strength: L (43#), R (54#).     Time 4   Period Weeks   Status New     PT LONG TERM GOAL #2   Title Pt. will increase L shoulder/bicep strength to grossly 4/5 MMT to improve lifting/carrying with household chores.     Baseline 3/5 L shoulder flexion/ abd. (pain).    Time 4   Period Weeks   Status New  PT LONG TERM GOAL #3   Title Pt. will complete QuickDASH and score <20% to improve pain-free mobility with L shoulder/UE tasks.    Baseline TBD   Time 4   Period Weeks   Status New     PT LONG TERM GOAL #4   Title Pt. able to sleep on L side with no increase c/o L shoulder pain.     Baseline pain limited on L shoulder/side at night.    Time 4   Period Weeks   Status New             Patient will benefit from skilled therapeutic intervention in order to improve the following deficits and impairments:  Decreased activity tolerance, Decreased endurance, Decreased strength, Improper body mechanics, Postural dysfunction, Obesity, Hypomobility, Pain  Visit Diagnosis: Chronic left shoulder pain  Stiffness of left shoulder joint  Muscle weakness (generalized)  Abnormal posture     Problem List Patient Active Problem List   Diagnosis Date Noted  . Diarrhea in  adult patient 12/03/2015  . Chronic pain in left shoulder 12/02/2015  . Family history of breast cancer 08/31/2015  . Vitamin D toxicity 05/25/2015  . Loss of balance 05/25/2015  . Medication adverse effect 04/30/2015  . Encounter for preventive health examination 01/31/2015  . Osteopenia determined by x-ray 08/19/2014  . Pain in joint, ankle and foot 07/28/2014  . Lumbago 07/26/2014  . Hypothyroidism due to acquired atrophy of thyroid 04/28/2014  . Depression with anxiety 04/28/2014  . Vitamin B12 deficiency (non anemic) 10/22/2013  . Statin intolerance 02/26/2013  . S/P TKR (total knee replacement) 02/25/2013  . Sacroiliac joint disease 02/01/2011  . Hyperlipidemia with target LDL less than 70 11/01/2010  . Diabetes mellitus type 2 in obese ([place])   . Essential hypertension   . Obesity (BMI 30-39.9)   . Diverticulosis of sigmoid colon    Pura Spice, PT, DPT # [phone] 12/23/2015, 12:14 PM  [facility] [address] [place], [place], [address] Phone: [phone]   Fax:  [phone]  Name: DEKOTA ZWAHLEN MRN: [id number] Date of Birth: 05-27-1940

## 2015-12-22 ENCOUNTER — Other Ambulatory Visit: Payer: Self-pay | Admitting: Internal Medicine

## 2015-12-23 ENCOUNTER — Ambulatory Visit: Payer: Medicare Other | Admitting: Physical Therapy

## 2015-12-23 ENCOUNTER — Encounter: Payer: Self-pay | Admitting: Physical Therapy

## 2015-12-23 DIAGNOSIS — R293 Abnormal posture: Secondary | ICD-10-CM

## 2015-12-23 DIAGNOSIS — M25512 Pain in left shoulder: Principal | ICD-10-CM

## 2015-12-23 DIAGNOSIS — G8929 Other chronic pain: Secondary | ICD-10-CM | POA: Diagnosis not present

## 2015-12-23 DIAGNOSIS — M25612 Stiffness of left shoulder, not elsewhere classified: Secondary | ICD-10-CM | POA: Diagnosis not present

## 2015-12-23 DIAGNOSIS — M6281 Muscle weakness (generalized): Secondary | ICD-10-CM | POA: Diagnosis not present

## 2015-12-23 NOTE — Therapy (Signed)
[facility] [address] [place], [place], [address] Phone: [phone]   Fax:  [phone]  Physical Therapy Treatment  Patient Details  Name: Brenda Floyd MRN: [id number] Date of Birth: 1940/05/15 Referring Provider: Reche Dixon, PA-C  Encounter Date: 12/23/2015      PT End of Session - 12/23/15 1605    Visit Number 2   Number of Visits 8   Date for PT Re-Evaluation 01/15/16   Authorization - Visit Number 2   Authorization - Number of Visits 10   PT Start Time [id number]   PT Stop Time 1345   PT Time Calculation (min) 59 min   Activity Tolerance Patient tolerated treatment well;Patient limited by pain   Behavior During Therapy [facility] for tasks assessed/performed      Past Medical History:  Diagnosis Date  . Cancer ([place])    skin ca  . Colon polyps 2012   [facility], followup due 2015  . Diabetes mellitus   . Diverticulosis of sigmoid colon July 2012   by colonoscopy  . Fracture closed, femur, shaft ([place]) remote   s/p screws and rod repair  . Hypertension   . Thyroid disease     Past Surgical History:  Procedure Laterality Date  . ABDOMINAL HYSTERECTOMY    . BREAST CYST ASPIRATION Left    neg  . JOINT REPLACEMENT     bilateral knee, 2011    There were no vitals filed for this visit.      Subjective Assessment - 12/23/15 1604    Subjective Pt states that she is able to move her L shoulder more than prior to physical therapy. States that her L shoulder pain has been chronic due to tendency to experience falls towards the left side. States she was lifting large bags of chicken feed >50lbs.    Limitations House hold activities;Other (comment)   Patient Stated Goals Increase L shoulder ROM/ strengthening/ pain mgmt.     Currently in Pain? No/denies            [facility] PT Assessment - 12/23/15 0001      Assessment   Medical Diagnosis L shoulder pain (chronic)   Referring Provider Reche Dixon, PA-C   Onset  Date/Surgical Date 02/10/15   Prior Therapy yes, known well to PT clinic.       Objective:  Therapeutic Exercise: UBE forwards/backwards 2 minutes ea. (warm up/no charge). Supine shoulder AAROM in flexion with wand x10. Pt with difficulty performing seated shoulder abd AAROM; improved tolerance with wall walks on wall ladder and with ball. Increased muscular activation with ball walks; onset of shoulder/arm trembling. Shoulder flexion with YTB x10; pt with difficulty maintaining long lever arm with tendency for biceps compensation. Shoulder flexion AROM with cueing to limit tendency for shrug x10. Shoulder IR/ER with yellow theraband x10 L; moderate cueing to limit trunk rotation.   Manual tx: Shoulder PROM to end range x1 all planes. AP/inf glide to L shoulder (6 bouts, 30 sec, grade II progressing to grade III).   Pt response for medical necessity. Pt demonstrates mild degree of pain with L shoulder AROM most notably with abduction and external rotation. She demonstrates moderate strength impairments in all shoulder planes; shows tendency for compensation esp with long lever tasks. Will benefit from gentle/progressive strengthening program to promote L shoulder stability.      PT Education - 12/23/15 1202    Education provided Yes   Education Details L shoulder HEP  (pulley flexion  and abd., Supine wand flexion and press-ups, standing B shoulder ER/scap. retraction with RTB).     Person(s) Educated Patient   Methods Explanation;Demonstration;Handout   Comprehension Verbalized understanding;Returned demonstration;Verbal cues required             PT Long Term Goals - 12/23/15 1210      PT LONG TERM GOAL #1   Title Pt. independent with HEP to increase L shoulder AROM to [facility] as compared to R shoulder to improve pain-free mobility.    Baseline R shoulder AROM WNL.  L shoulder AROM (supine/ sitting):  flexion (132 deg./ 129 deg.), abd. (122 deg./92 deg.), ER (53 deg.), IR (80 deg.).     Grip strength: L (43#), R (54#).     Time 4   Period Weeks   Status New     PT LONG TERM GOAL #2   Title Pt. will increase L shoulder/bicep strength to grossly 4/5 MMT to improve lifting/carrying with household chores.     Baseline 3/5 L shoulder flexion/ abd. (pain).    Time 4   Period Weeks   Status New     PT LONG TERM GOAL #3   Title Pt. will complete QuickDASH and score <20% to improve pain-free mobility with L shoulder/UE tasks.    Baseline TBD   Time 4   Period Weeks   Status New     PT LONG TERM GOAL #4   Title Pt. able to sleep on L side with no increase c/o L shoulder pain.     Baseline pain limited on L shoulder/side at night.    Time 4   Period Weeks   Status New            Plan - 12/23/15 1607    Clinical Impression Statement Pt with mild tenderness to palpation along L shoulder posterior jt line. Pt tolerates increased L shoulder abduction range with PROM as opposed to AROM; moderate muscle trembling with L shoulder AROM as compared to R. Pt with improved tolerance of AAROM in shoulder flexion/abduction with wall ladder. Tendency for compensation with ER/IR with trunk; responsive to cueing but moderate weakness of instrinsic L shoulder muscular demonstrated.   Rehab Potential Good   PT Frequency 2x / week   PT Duration 4 weeks   PT Treatment/Interventions Functional mobility training;Therapeutic activities;Therapeutic exercise;Balance training;Neuromuscular re-education;Patient/family education;Manual techniques;Moist Heat;Electrical Stimulation;Cryotherapy;ADLs/Self Care Home Management;Passive range of motion   PT Next Visit Plan Progress HEP   Consulted and Agree with Plan of Care Patient      Patient will benefit from skilled therapeutic intervention in order to improve the following deficits and impairments:  Decreased activity tolerance, Decreased endurance, Decreased strength, Improper body mechanics, Postural dysfunction, Obesity, Hypomobility,  Pain  Visit Diagnosis: Chronic left shoulder pain  Stiffness of left shoulder joint  Muscle weakness (generalized)  Abnormal posture     Problem List Patient Active Problem List   Diagnosis Date Noted  . Diarrhea in adult patient 12/03/2015  . Chronic pain in left shoulder 12/02/2015  . Family history of breast cancer 08/31/2015  . Vitamin D toxicity 05/25/2015  . Loss of balance 05/25/2015  . Medication adverse effect 04/30/2015  . Encounter for preventive health examination 01/31/2015  . Osteopenia determined by x-ray 08/19/2014  . Pain in joint, ankle and foot 07/28/2014  . Lumbago 07/26/2014  . Hypothyroidism due to acquired atrophy of thyroid 04/28/2014  . Depression with anxiety 04/28/2014  . Vitamin B12 deficiency (non anemic) 10/22/2013  . Statin  intolerance 02/26/2013  . S/P TKR (total knee replacement) 02/25/2013  . Sacroiliac joint disease 02/01/2011  . Hyperlipidemia with target LDL less than 70 11/01/2010  . Diabetes mellitus type 2 in obese ([place])   . Essential hypertension   . Obesity (BMI 30-39.9)   . Diverticulosis of sigmoid colon    Pura Spice, PT, DPT # [id number] Mickel Baas Missy Baksh SPT 12/23/2015, 5:23 PM  [facility] [address] [place], [place], [address] Phone: [phone]   Fax:  [phone]  Name: Brenda Floyd MRN: [id number] Date of Birth: [birth year]

## 2015-12-23 NOTE — Addendum Note (Signed)
Addended by: Dorcas Carrow C on: 12/23/2015 12:19 PM   Modules accepted: Orders

## 2015-12-26 ENCOUNTER — Ambulatory Visit: Payer: Medicare Other | Admitting: Physical Therapy

## 2015-12-26 DIAGNOSIS — M25612 Stiffness of left shoulder, not elsewhere classified: Secondary | ICD-10-CM

## 2015-12-26 DIAGNOSIS — G8929 Other chronic pain: Secondary | ICD-10-CM

## 2015-12-26 DIAGNOSIS — R293 Abnormal posture: Secondary | ICD-10-CM

## 2015-12-26 DIAGNOSIS — M25512 Pain in left shoulder: Secondary | ICD-10-CM | POA: Diagnosis not present

## 2015-12-26 DIAGNOSIS — M6281 Muscle weakness (generalized): Secondary | ICD-10-CM | POA: Diagnosis not present

## 2015-12-27 NOTE — Therapy (Signed)
[facility] [address] [place], [place], [address] Phone: [phone]   Fax:  [phone]  Physical Therapy Treatment  Patient Details  Name: Brenda Floyd MRN: [id number] Date of Birth: 1940-10-20 Referring Provider: Reche Dixon, PA-C  Encounter Date: 12/26/2015      PT End of Session - 12/27/15 1349    Visit Number 3   Number of Visits 8   Date for PT Re-Evaluation 01/15/16   Authorization - Visit Number 3   Authorization - Number of Visits 10   PT Start Time 1250   PT Stop Time [id number]   PT Time Calculation (min) 59 min   Activity Tolerance Patient tolerated treatment well;Patient limited by pain   Behavior During Therapy [facility] for tasks assessed/performed      Past Medical History:  Diagnosis Date  . Cancer ([place])    skin ca  . Colon polyps 2012   [facility], followup due 2015  . Diabetes mellitus   . Diverticulosis of sigmoid colon July 2012   by colonoscopy  . Fracture closed, femur, shaft ([place]) remote   s/p screws and rod repair  . Hypertension   . Thyroid disease     Past Surgical History:  Procedure Laterality Date  . ABDOMINAL HYSTERECTOMY    . BREAST CYST ASPIRATION Left    neg  . JOINT REPLACEMENT     bilateral knee, 2011    There were no vitals filed for this visit.      Subjective Assessment - 12/27/15 1346    Subjective Pt. states she is doing better.  Pt. reports compliance with HEP.  No new complaints.    Limitations House hold activities;Other (comment)   Patient Stated Goals Increase L shoulder ROM/ strengthening/ pain mgmt.     Currently in Pain? No/denies      Objective:  Therapeutic Exercise: UBE forwards/backwards 2 minutes ea. (warm up/no charge).  Standing wall ladder (flexion/ abd.).  Supine L shoulder A/AROM in flexion with wand x10. Pt with difficulty performing seated shoulder abd AAROM; improved tolerance with wall walks on wall ladder and with ball. Increased muscular  activation with ball walks; onset of shoulder/arm trembling.  Shoulder flexion AROM with cueing to limit tendency for shrug x10. Shoulder IR/ER with yellow theraband x10 L; moderate cueing to limit trunk rotation.  Standing sh. Scaption/ rows/ ext. 1# 20x each.  Supine L shoulder rhythmic stabs (min. To moderate resistance) 4x.  L sh. IR/ER isometrics 10x.      Manual tx: Shoulder PROM to end range x1 all planes. AP/inf glide to L shoulder (6 bouts, 30 sec, grade II progressing to grade III).   Pt response for medical necessity. Pt demonstrates mild degree of pain with L shoulder AROM most notably with abduction and external rotation. She demonstrates moderate strength impairments in all shoulder planes; shows tendency for compensation esp with long lever tasks. Will benefit from gentle/progressive strengthening program to promote L shoulder stability.       PT Long Term Goals - 12/23/15 1210      PT LONG TERM GOAL #1   Title Pt. independent with HEP to increase L shoulder AROM to [facility] as compared to R shoulder to improve pain-free mobility.    Baseline R shoulder AROM WNL.  L shoulder AROM (supine/ sitting):  flexion (132 deg./ 129 deg.), abd. (122 deg./92 deg.), ER (53 deg.), IR (80 deg.).    Grip strength: L (43#), R (54#).     Time  4   Period Weeks   Status New     PT LONG TERM GOAL #2   Title Pt. will increase L shoulder/bicep strength to grossly 4/5 MMT to improve lifting/carrying with household chores.     Baseline 3/5 L shoulder flexion/ abd. (pain).    Time 4   Period Weeks   Status New     PT LONG TERM GOAL #3   Title Pt. will complete QuickDASH and score <20% to improve pain-free mobility with L shoulder/UE tasks.    Baseline TBD   Time 4   Period Weeks   Status New     PT LONG TERM GOAL #4   Title Pt. able to sleep on L side with no increase c/o L shoulder pain.     Baseline pain limited on L shoulder/side at night.    Time 4   Period Weeks   Status New                Plan - 12/27/15 1350    Clinical Impression Statement Pt. tolerates increase L shoulder flexion/ abd. with PROM in supine position.  L shoulder muscle fasciculations noted with L shoulder AROM as compared to R UE.  Pt. requires cuing for proper technique with AAROM/ resisted tasks.  Moderate L shoulder/ UE muscle weakness noted t/o tx.  PT encouraged continued compliance with ROM/ strengthening ex. program.     Rehab Potential Good   PT Frequency 2x / week   PT Duration 4 weeks   PT Treatment/Interventions Functional mobility training;Therapeutic activities;Therapeutic exercise;Balance training;Neuromuscular re-education;Patient/family education;Manual techniques;Moist Heat;Electrical Stimulation;Cryotherapy;ADLs/Self Care Home Management;Passive range of motion   PT Next Visit Plan Increase L shoulder ROM/ strengthening.  Functional tasks.     Consulted and Agree with Plan of Care Patient      Patient will benefit from skilled therapeutic intervention in order to improve the following deficits and impairments:  Decreased activity tolerance, Decreased endurance, Decreased strength, Improper body mechanics, Postural dysfunction, Obesity, Hypomobility, Pain  Visit Diagnosis: Chronic left shoulder pain  Stiffness of left shoulder joint  Muscle weakness (generalized)  Abnormal posture     Problem List Patient Active Problem List   Diagnosis Date Noted  . Diarrhea in adult patient 12/03/2015  . Chronic pain in left shoulder 12/02/2015  . Family history of breast cancer 08/31/2015  . Vitamin D toxicity 05/25/2015  . Loss of balance 05/25/2015  . Medication adverse effect 04/30/2015  . Encounter for preventive health examination 01/31/2015  . Osteopenia determined by x-ray 08/19/2014  . Pain in joint, ankle and foot 07/28/2014  . Lumbago 07/26/2014  . Hypothyroidism due to acquired atrophy of thyroid 04/28/2014  . Depression with anxiety 04/28/2014  . Vitamin  B12 deficiency (non anemic) 10/22/2013  . Statin intolerance 02/26/2013  . S/P TKR (total knee replacement) 02/25/2013  . Sacroiliac joint disease 02/01/2011  . Hyperlipidemia with target LDL less than 70 11/01/2010  . Diabetes mellitus type 2 in obese ([place])   . Essential hypertension   . Obesity (BMI 30-39.9)   . Diverticulosis of sigmoid colon    Pura Spice, PT, DPT # [phone] 12/27/2015, 1:55 PM  [facility] [address] [place], [place], [address] Phone: [phone]   Fax:  [phone]  Name: BRIGHID VONDRACEK MRN: [id number] Date of Birth: Jun 21, 1940

## 2015-12-31 ENCOUNTER — Ambulatory Visit: Payer: Medicare Other

## 2015-12-31 DIAGNOSIS — M25512 Pain in left shoulder: Secondary | ICD-10-CM | POA: Diagnosis not present

## 2015-12-31 DIAGNOSIS — R293 Abnormal posture: Secondary | ICD-10-CM | POA: Diagnosis not present

## 2015-12-31 DIAGNOSIS — G8929 Other chronic pain: Secondary | ICD-10-CM

## 2015-12-31 DIAGNOSIS — M6281 Muscle weakness (generalized): Secondary | ICD-10-CM

## 2015-12-31 DIAGNOSIS — M25612 Stiffness of left shoulder, not elsewhere classified: Secondary | ICD-10-CM

## 2015-12-31 NOTE — Therapy (Signed)
[facility] [address] [place], [place], [address] Phone: [phone]   Fax:  [phone]  Physical Therapy Treatment  Patient Details  Name: Brenda Floyd MRN: [id number] Date of Birth: 1940/08/06 Referring Provider: Reche Dixon, PA-C  Encounter Date: 12/31/2015      PT End of Session - 12/31/15 1352    Visit Number 4   Number of Visits 8   Date for PT Re-Evaluation 01/15/16   Authorization - Visit Number 4   Authorization - Number of Visits 10   PT Start Time 1340   PT Stop Time 1430   PT Time Calculation (min) 50 min   Activity Tolerance Patient tolerated treatment well;Patient limited by pain   Behavior During Therapy [facility] for tasks assessed/performed      Past Medical History:  Diagnosis Date  . Cancer ([place])    skin ca  . Colon polyps 2012   [place], followup due 2015  . Diabetes mellitus   . Diverticulosis of sigmoid colon July 2012   by colonoscopy  . Fracture closed, femur, shaft ([place]) remote   s/p screws and rod repair  . Hypertension   . Thyroid disease     Past Surgical History:  Procedure Laterality Date  . ABDOMINAL HYSTERECTOMY    . BREAST CYST ASPIRATION Left    neg  . JOINT REPLACEMENT     bilateral knee, 2011    There were no vitals filed for this visit.      Subjective Assessment - 12/31/15 1349    Subjective Pt reports she is seeing excellent improvemet with physical therapy. She is noticing considerable improvement in L shoulder AROM and decrease in pain. HEP is going well without question or concerns.   Limitations House hold activities;Other (comment)   Patient Stated Goals Increase L shoulder ROM/ strengthening/ pain mgmt.     Currently in Pain? No/denies       TREATMENT  Therapeutic Exercise UBE forwards/backwards 2 minutes ea. (warm up/no charge) followed by an additional 1 minute forward and 1 minute backwards; Supine L shoulder A/AROM in flexion with wand 2 x 10; Cane  chest press-up plus with manual resistance for serratus activation 2 x 10; Supine L shoulder rhythmic stabs (min. To moderate resistance) 30 seconds x 2; Seated L shoulder AROM scaption, pt with notable shoulder hiking so provided upward rotation, external rotation and retraction manual assist to scapular mobility; Seated scapular retraction x 10 with tactile cues for performance; Seated scapular retraction hold with scaption x 10, notable decrease in L shoulder hiking with scapular retraction; Standing wall ladder for flexion/scaption x 5 flexion and x 3 scaption (limited due to pain); Standing blue Tband low rows 2 x 10; Standing blue Tband straight arm extension to neutral 2 x 10;  Standing yellow Tband L shoulder ER 2 x 10; CP applied to L shoulder (unbilled);  Manual Therapy L shoulder PROM to end range with 5 second hold for flexion, scaption and ER with multiple bouts in all planes; L shoulder superior to inferior grade III mobilizations at 90 flexion, 30 seconds/bout x 3 bouts; L shoulder inferior and posterior medial mobilizations at end range flexion to improve flexion grade III, 30 seconds/bout x 3 bouts Improvement in flexion and ER AROM/PROM following manual therapy,                             PT Education - 12/31/15 1350  Education provided Yes   Education Details Reinforced importance of HEP;    Person(s) Educated Patient   Methods Explanation   Comprehension Verbalized understanding             PT Long Term Goals - 12/23/15 1210      PT LONG TERM GOAL #1   Title Pt. independent with HEP to increase L shoulder AROM to [facility] as compared to R shoulder to improve pain-free mobility.    Baseline R shoulder AROM WNL.  L shoulder AROM (supine/ sitting):  flexion (132 deg./ 129 deg.), abd. (122 deg./92 deg.), ER (53 deg.), IR (80 deg.).    Grip strength: L (43#), R (54#).     Time 4   Period Weeks   Status New     PT LONG TERM GOAL #2   Title  Pt. will increase L shoulder/bicep strength to grossly 4/5 MMT to improve lifting/carrying with household chores.     Baseline 3/5 L shoulder flexion/ abd. (pain).    Time 4   Period Weeks   Status New     PT LONG TERM GOAL #3   Title Pt. will complete QuickDASH and score <20% to improve pain-free mobility with L shoulder/UE tasks.    Baseline TBD   Time 4   Period Weeks   Status New     PT LONG TERM GOAL #4   Title Pt. able to sleep on L side with no increase c/o L shoulder pain.     Baseline pain limited on L shoulder/side at night.    Time 4   Period Weeks   Status New               Plan - 12/31/15 1352    Clinical Impression Statement Pt continues to report some L shoulder pain with end range flexion and scapular abduction. She continues to lack some L shoulder flexion compared to the R side. She is able to increase her intensity with ther-ex on this date. Pt encouraged to continue HEP and follow-up as scheduled.    Rehab Potential Good   PT Frequency 2x / week   PT Duration 4 weeks   PT Treatment/Interventions Functional mobility training;Therapeutic activities;Therapeutic exercise;Balance training;Neuromuscular re-education;Patient/family education;Manual techniques;Moist Heat;Electrical Stimulation;Cryotherapy;ADLs/Self Care Home Management;Passive range of motion   PT Next Visit Plan Increase L shoulder ROM/ strengthening.  Functional tasks.     PT Home Exercise Plan As prescribed   Consulted and Agree with Plan of Care Patient      Patient will benefit from skilled therapeutic intervention in order to improve the following deficits and impairments:  Decreased activity tolerance, Decreased endurance, Decreased strength, Improper body mechanics, Postural dysfunction, Obesity, Hypomobility, Pain  Visit Diagnosis: Chronic left shoulder pain  Stiffness of left shoulder joint  Muscle weakness (generalized)     Problem List Patient Active Problem List    Diagnosis Date Noted  . Diarrhea in adult patient 12/03/2015  . Chronic pain in left shoulder 12/02/2015  . Family history of breast cancer 08/31/2015  . Vitamin D toxicity 05/25/2015  . Loss of balance 05/25/2015  . Medication adverse effect 04/30/2015  . Encounter for preventive health examination 01/31/2015  . Osteopenia determined by x-ray 08/19/2014  . Pain in joint, ankle and foot 07/28/2014  . Lumbago 07/26/2014  . Hypothyroidism due to acquired atrophy of thyroid 04/28/2014  . Depression with anxiety 04/28/2014  . Vitamin B12 deficiency (non anemic) 10/22/2013  . Statin intolerance 02/26/2013  . S/P TKR (total knee  replacement) 02/25/2013  . Sacroiliac joint disease 02/01/2011  . Hyperlipidemia with target LDL less than 70 11/01/2010  . Diabetes mellitus type 2 in obese ([place])   . Essential hypertension   . Obesity (BMI 30-39.9)   . Diverticulosis of sigmoid colon    Phillips Grout PT, DPT   Huprich,Jason 12/31/2015, 2:54 PM  [facility] [address] [place], [place], [address] Phone: [phone]   Fax:  [phone]  Name: Brenda Floyd MRN: [id number] Date of Birth: [birth year]

## 2016-01-09 ENCOUNTER — Encounter: Payer: Self-pay | Admitting: Physical Therapy

## 2016-01-09 ENCOUNTER — Ambulatory Visit: Payer: Medicare Other | Admitting: Physical Therapy

## 2016-01-09 DIAGNOSIS — R293 Abnormal posture: Secondary | ICD-10-CM

## 2016-01-09 DIAGNOSIS — M6281 Muscle weakness (generalized): Secondary | ICD-10-CM

## 2016-01-09 DIAGNOSIS — M25612 Stiffness of left shoulder, not elsewhere classified: Secondary | ICD-10-CM

## 2016-01-09 DIAGNOSIS — M25512 Pain in left shoulder: Secondary | ICD-10-CM | POA: Diagnosis not present

## 2016-01-09 DIAGNOSIS — G8929 Other chronic pain: Secondary | ICD-10-CM

## 2016-01-09 NOTE — Therapy (Addendum)
[facility] [address] [place], [place], [address] Phone: [phone]   Fax:  [phone]  Physical Therapy Treatment  Patient Details  Name: Brenda Floyd MRN: [id number] Date of Birth: [birth year] Referring Provider: Reche Dixon, PA-C  Encounter Date: 01/09/2016      PT End of Session - 01/09/16 1247    Visit Number 5   Number of Visits 8   Authorization - Visit Number 5   Authorization - Number of Visits 10   PT Start Time [id number]   PT Stop Time 1344   PT Time Calculation (min) 57 min   Activity Tolerance Patient tolerated treatment well;Patient limited by pain   Behavior During Therapy [facility] for tasks assessed/performed      Past Medical History:  Diagnosis Date  . Cancer ([place])    skin ca  . Colon polyps 2012   [facility], followup due 2015  . Diabetes mellitus   . Diverticulosis of sigmoid colon July 2012   by colonoscopy  . Fracture closed, femur, shaft ([place]) remote   s/p screws and rod repair  . Hypertension   . Thyroid disease     Past Surgical History:  Procedure Laterality Date  . ABDOMINAL HYSTERECTOMY    . BREAST CYST ASPIRATION Left    neg  . JOINT REPLACEMENT     bilateral knee, 2011    There were no vitals filed for this visit.      Subjective Assessment - 01/09/16 1245    Subjective Pt. reports increase L shoulder soreness since last PT tx. session.  No pain currently at rest.     Limitations House hold activities;Other (comment)   Patient Stated Goals Increase L shoulder ROM/ strengthening/ pain mgmt.     Currently in Pain? No/denies      Objective:  Therapeutic Exercise: UBE forwards/backwards 2 minutes ea. (warm up/no charge).  Standing wall ladder (flexion/ abd.).  Supine L shoulder A/AROM in flexion with and without wand 10x (all planes).  Supine L shoulder rhythmic stabs (all planes)- min. To mod. Resistance 5x 20 sec. Each.  Shoulder flexion AROM with cueing to limit tendency for  shrug x10. Shoulder IR/ER with yellow theraband x10 L; moderate cueing to limit trunk rotation.  Standing sh. Scaption/ rows/ ext. 1# 20x each.  Supine L shoulder isometrics IR/ER isometrics 10x (as tolerated)- muscle fasciculations noted.    Manual tx: Shoulder PROM to end range x10 all planes. AP/inf glide to L shoulder (5 bouts, 30 sec, grade II progressing to grade III).  STM to L shoulder/ UT musculature during AROM.    Pt response for medical necessity. Pt demonstrates mild degree of pain with L shoulder AROM most notably with abduction and external rotation. She demonstrates moderate strength impairments in all shoulder planes; shows tendency for compensation esp with long lever tasks. Will benefit from gentle/progressive strengthening program to promote L shoulder stability.  Pt. presents with improving L shoulder AROM with discomfort at end-range movements and moderate muscle weakness/ fatigue noted.  PT continues to focus on sh. stability/ strengthening ex. program with proper posture/technique.  Pt. reports feeling better/ "looser" in L shoulder after tx. session.  Marked fatigue/ muscle fasciculations noted in L shoulder with strength training.          PT Long Term Goals - 12/23/15 1210      PT LONG TERM GOAL #1   Title Pt. independent with HEP to increase L shoulder AROM to [facility] as compared to  R shoulder to improve pain-free mobility.    Baseline R shoulder AROM WNL.  L shoulder AROM (supine/ sitting):  flexion (132 deg./ 129 deg.), abd. (122 deg./92 deg.), ER (53 deg.), IR (80 deg.).    Grip strength: L (43#), R (54#).     Time 4   Period Weeks   Status New     PT LONG TERM GOAL #2   Title Pt. will increase L shoulder/bicep strength to grossly 4/5 MMT to improve lifting/carrying with household chores.     Baseline 3/5 L shoulder flexion/ abd. (pain).    Time 4   Period Weeks   Status New     PT LONG TERM GOAL #3   Title Pt. will complete QuickDASH and score <20% to  improve pain-free mobility with L shoulder/UE tasks.    Baseline TBD   Time 4   Period Weeks   Status New     PT LONG TERM GOAL #4   Title Pt. able to sleep on L side with no increase c/o L shoulder pain.     Baseline pain limited on L shoulder/side at night.    Time 4   Period Weeks   Status New               Plan - 01/09/16 1345    Rehab Potential Good   PT Frequency 2x / week   PT Duration 4 weeks   PT Treatment/Interventions Functional mobility training;Therapeutic activities;Therapeutic exercise;Balance training;Neuromuscular re-education;Patient/family education;Manual techniques;Moist Heat;Electrical Stimulation;Cryotherapy;ADLs/Self Care Home Management;Passive range of motion   PT Next Visit Plan Increase L shoulder ROM/ strengthening.  Functional tasks.     PT Home Exercise Plan As prescribed   Consulted and Agree with Plan of Care Patient      Patient will benefit from skilled therapeutic intervention in order to improve the following deficits and impairments:  Decreased activity tolerance, Decreased endurance, Decreased strength, Improper body mechanics, Postural dysfunction, Obesity, Hypomobility, Pain  Visit Diagnosis: Chronic left shoulder pain  Stiffness of left shoulder joint  Muscle weakness (generalized)  Abnormal posture     Problem List Patient Active Problem List   Diagnosis Date Noted  . Diarrhea in adult patient 12/03/2015  . Chronic pain in left shoulder 12/02/2015  . Family history of breast cancer 08/31/2015  . Vitamin D toxicity 05/25/2015  . Loss of balance 05/25/2015  . Medication adverse effect 04/30/2015  . Encounter for preventive health examination 01/31/2015  . Osteopenia determined by x-ray 08/19/2014  . Pain in joint, ankle and foot 07/28/2014  . Lumbago 07/26/2014  . Hypothyroidism due to acquired atrophy of thyroid 04/28/2014  . Depression with anxiety 04/28/2014  . Vitamin B12 deficiency (non anemic) 10/22/2013  .  Statin intolerance 02/26/2013  . S/P TKR (total knee replacement) 02/25/2013  . Sacroiliac joint disease 02/01/2011  . Hyperlipidemia with target LDL less than 70 11/01/2010  . Diabetes mellitus type 2 in obese ([place])   . Essential hypertension   . Obesity (BMI 30-39.9)   . Diverticulosis of sigmoid colon    Pura Spice, PT, DPT # [phone] 01/09/2016, 1:46 PM  [facility] [address] [place], [place], [address] Phone: [phone]   Fax:  [phone]  Name: Brenda Floyd MRN: [id number] Date of Birth: 16-Nov-1940

## 2016-01-13 ENCOUNTER — Other Ambulatory Visit: Payer: Self-pay | Admitting: Internal Medicine

## 2016-01-14 ENCOUNTER — Ambulatory Visit: Payer: Medicare Other | Attending: Orthopedic Surgery | Admitting: Physical Therapy

## 2016-01-14 ENCOUNTER — Ambulatory Visit (INDEPENDENT_AMBULATORY_CARE_PROVIDER_SITE_OTHER): Payer: Medicare Other

## 2016-01-14 VITALS — BP 122/82 | HR 99 | Temp 98.4°F | Resp 14 | Ht 64.5 in | Wt 252.0 lb

## 2016-01-14 DIAGNOSIS — R293 Abnormal posture: Secondary | ICD-10-CM | POA: Diagnosis not present

## 2016-01-14 DIAGNOSIS — M25612 Stiffness of left shoulder, not elsewhere classified: Secondary | ICD-10-CM | POA: Diagnosis not present

## 2016-01-14 DIAGNOSIS — G8929 Other chronic pain: Secondary | ICD-10-CM

## 2016-01-14 DIAGNOSIS — M25512 Pain in left shoulder: Secondary | ICD-10-CM | POA: Insufficient documentation

## 2016-01-14 DIAGNOSIS — M6281 Muscle weakness (generalized): Secondary | ICD-10-CM | POA: Insufficient documentation

## 2016-01-14 DIAGNOSIS — R2689 Other abnormalities of gait and mobility: Secondary | ICD-10-CM | POA: Insufficient documentation

## 2016-01-14 DIAGNOSIS — Z0000 Encounter for general adult medical examination without abnormal findings: Secondary | ICD-10-CM

## 2016-01-14 NOTE — Patient Instructions (Addendum)
Ms. Brenda Floyd , Thank you for taking time to come for your Medicare Wellness Visit. I appreciate your ongoing commitment to your health goals. Please review the following plan we discussed and let me know if I can assist you in the future.   FOLLOW UP WITH DR. Derrel Nip AS NEEDED.  These are the goals we discussed: Goals    . Increase physical activity          Continue physical therapy exercises Chair exercises Walk as tolerated       This is a list of the screening recommended for you and due dates:  Health Maintenance  Topic Date Due  . Shingles Vaccine  02/09/2019*  . Flu Shot  12/09/2021*  . Hemoglobin A1C  06/01/2016  . Complete foot exam   08/28/2016  . Mammogram  10/01/2016  . Eye exam for diabetics  12/10/2016  . Tetanus Vaccine  11/08/2021  . DEXA scan (bone density measurement)  Completed  . Pneumonia vaccines  Completed  *Topic was postponed. The date shown is not the original due date.      Fall Prevention in the Home Introduction Falls can cause injuries. They can happen to people of all ages. There are many things you can do to make your home safe and to help prevent falls. What can I do on the outside of my home?  Regularly fix the edges of walkways and driveways and fix any cracks.  Remove anything that might make you trip as you walk through a door, such as a raised step or threshold.  Trim any bushes or trees on the path to your home.  Use bright outdoor lighting.  Clear any walking paths of anything that might make someone trip, such as rocks or tools.  Regularly check to see if handrails are loose or broken. Make sure that both sides of any steps have handrails.  Any raised decks and porches should have guardrails on the edges.  Have any leaves, snow, or ice cleared regularly.  Use sand or salt on walking paths during winter.  Clean up any spills in your garage right away. This includes oil or grease spills. What can I do in the  bathroom?  Use night lights.  Install grab bars by the toilet and in the tub and shower. Do not use towel bars as grab bars.  Use non-skid mats or decals in the tub or shower.  If you need to sit down in the shower, use a plastic, non-slip stool.  Keep the floor dry. Clean up any water that spills on the floor as soon as it happens.  Remove soap buildup in the tub or shower regularly.  Attach bath mats securely with double-sided non-slip rug tape.  Do not have throw rugs and other things on the floor that can make you trip. What can I do in the bedroom?  Use night lights.  Make sure that you have a light by your bed that is easy to reach.  Do not use any sheets or blankets that are too big for your bed. They should not hang down onto the floor.  Have a firm chair that has side arms. You can use this for support while you get dressed.  Do not have throw rugs and other things on the floor that can make you trip. What can I do in the kitchen?  Clean up any spills right away.  Avoid walking on wet floors.  Keep items that you use a lot in  easy-to-reach places.  If you need to reach something above you, use a strong step stool that has a grab bar.  Keep electrical cords out of the way.  Do not use floor polish or wax that makes floors slippery. If you must use wax, use non-skid floor wax.  Do not have throw rugs and other things on the floor that can make you trip. What can I do with my stairs?  Do not leave any items on the stairs.  Make sure that there are handrails on both sides of the stairs and use them. Fix handrails that are broken or loose. Make sure that handrails are as long as the stairways.  Check any carpeting to make sure that it is firmly attached to the stairs. Fix any carpet that is loose or worn.  Avoid having throw rugs at the top or bottom of the stairs. If you do have throw rugs, attach them to the floor with carpet tape.  Make sure that you have a  light switch at the top of the stairs and the bottom of the stairs. If you do not have them, ask someone to add them for you. What else can I do to help prevent falls?  Wear shoes that:  Do not have high heels.  Have rubber bottoms.  Are comfortable and fit you well.  Are closed at the toe. Do not wear sandals.  If you use a stepladder:  Make sure that it is fully opened. Do not climb a closed stepladder.  Make sure that both sides of the stepladder are locked into place.  Ask someone to hold it for you, if possible.  Clearly mark and make sure that you can see:  Any grab bars or handrails.  First and last steps.  Where the edge of each step is.  Use tools that help you move around (mobility aids) if they are needed. These include:  Canes.  Walkers.  Scooters.  Crutches.  Turn on the lights when you go into a dark area. Replace any light bulbs as soon as they burn out.  Set up your furniture so you have a clear path. Avoid moving your furniture around.  If any of your floors are uneven, fix them.  If there are any pets around you, be aware of where they are.  Review your medicines with your doctor. Some medicines can make you feel dizzy. This can increase your chance of falling. Ask your doctor what other things that you can do to help prevent falls. This information is not intended to replace advice given to you by your health care provider. Make sure you discuss any questions you have with your health care provider. Document Released: 11/22/2008 Document Revised: 07/04/2015 Document Reviewed: 03/02/2014  2017 [employer]

## 2016-01-14 NOTE — Progress Notes (Signed)
Subjective:   Brenda Floyd is a 75 y.o. female who presents for Medicare Annual (Subsequent) preventive examination.  Review of Systems:  No ROS.  Medicare Wellness Visit.  Cardiac Risk Factors include: advanced age (>6men, >62 women);diabetes mellitus;obesity (BMI >30kg/m2);hypertension     Objective:     Vitals: BP 122/82 (BP Location: Right Arm, Patient Position: Sitting, Cuff Size: Large)   Pulse 99   Temp 98.4 F (36.9 C) (Oral)   Resp 14   Ht 5' 4.5" (1.638 m)   Wt 252 lb (114.3 kg)   SpO2 95%   BMI 42.59 kg/m   Body mass index is 42.59 kg/m.   Tobacco History  Smoking Status  . Former Smoker  . Quit date: 10/30/1990  Smokeless Tobacco  . Never Used     Counseling given: Not Answered   Past Medical History:  Diagnosis Date  . Cancer ([place])    skin ca  . Colon polyps 2012   [facility], followup due 2015  . Diabetes mellitus   . Diverticulosis of sigmoid colon July 2012   by colonoscopy  . Fracture closed, femur, shaft ([place]) remote   s/p screws and rod repair  . Hypertension   . Thyroid disease    Past Surgical History:  Procedure Laterality Date  . ABDOMINAL HYSTERECTOMY    . BREAST CYST ASPIRATION Left    neg  . JOINT REPLACEMENT     bilateral knee, 2011   Family History  Problem Relation Age of Onset  . Cancer Mother     breast cancer and colon ca  . Breast cancer Mother 91  . Cancer Father     leukemia  . Diabetes Sister   . Hypertension Sister   . Breast cancer Sister 38  . Breast cancer Maternal Aunt   . Breast cancer Cousin     maternal 1st cousin   History  Sexual Activity  . Sexual activity: No    Outpatient Encounter Prescriptions as of 01/14/2016  Medication Sig  . acetaminophen (TYLENOL) 325 MG tablet Take 650 mg by mouth as needed.    Marland Kitchen amoxicillin (AMOXIL) 500 MG capsule TAKE 4 CAPSULES BY MOUTH ONE HOUR BEFORE APPOINTMENT  . aspirin 81 MG tablet Take 1 tablet (81 mg total) by mouth daily.  . Cholecalciferol  (VITAMIN D3) 5000 UNITS CAPS Take 1 tablet by mouth daily. Reported on 08/29/2015  . co-enzyme Q-10 30 MG capsule Take 1 capsule (30 mg total) by mouth daily.  . cyanocobalamin (,VITAMIN B-12,) 1000 MCG/ML injection 1 ml injected monthly  . fexofenadine (ALLEGRA) 180 MG tablet Take 180 mg by mouth daily.  Marland Kitchen glucose blood (FREESTYLE LITE) test strip Use to check blood sugars once daily,  Twice if needed   DM 250.00  . INS SYRINGE/NEEDLE 1CC/28G (B-D INSULIN SYRINGE 1CC/28G) 28G X 1/2" 1 ML MISC 1 Syringe by Does not apply route daily after supper. For use with insulin  . Insulin Glargine (LANTUS SOLOSTAR) 100 UNIT/ML Solostar Pen INJECT 22 UNITS UNDER THE SKIN DAILY  . levothyroxine (SYNTHROID, LEVOTHROID) 75 MCG tablet TAKE 1 TABLET DAILY FOR HYPOTHYROIDISM  . lisinopril (PRINIVIL,ZESTRIL) 20 MG tablet TAKE ONE TABLET BY MOUTH ONCE DAILY  . magnesium gluconate (MAGONATE) 500 MG tablet Take 500 mg by mouth daily.  . meloxicam (MOBIC) 15 MG tablet TAKE ONE TABLET BY MOUTH ONCE DAILY  . montelukast (SINGULAIR) 10 MG tablet Take 1 tablet (10 mg total) by mouth at bedtime.  Marland Kitchen PARoxetine (PAXIL) 20 MG tablet  Take 1 tablet (20 mg total) by mouth daily.  . Syringe, Disposable, 1 ML MISC For use with b12 serum  . Syringe/Needle, Disp, (SYRINGE 3CC/25GX1") 25G X 1" 3 ML MISC Use as directed  . traMADol (ULTRAM) 50 MG tablet TAKE ONE TABLET BY MOUTH 4 TIMES DAILY AS NEEDED FOR KNEE PAIN  . TURMERIC PO Take by mouth daily.  . insulin regular (HUMULIN R) 100 units/mL injection Inject 0.1 mLs (10 Units total) into the skin 3 (three) times daily before meals. For BS > 200 (Patient not taking: Reported on 01/14/2016)  . [DISCONTINUED] metFORMIN (GLUCOPHAGE-XR) 750 MG 24 hr tablet TAKE 1 TABLET THREE TIMES A DAY   No facility-administered encounter medications on file as of 01/14/2016.     Activities of Daily Living In your present state of health, do you have any difficulty performing the following activities:  01/14/2016  Hearing? N  Vision? N  Difficulty concentrating or making decisions? N  Walking or climbing stairs? N  Dressing or bathing? N  Doing errands, shopping? N  Preparing Food and eating ? N  Using the Toilet? N  In the past six months, have you accidently leaked urine? Y  Do you have problems with loss of bowel control? N  Managing your Medications? N  Managing your Finances? N  Housekeeping or managing your Housekeeping? N  Some recent data might be hidden    Patient Care Team: Crecencio Mc, MD as PCP - General (Internal Medicine)    Assessment:    This is a routine wellness examination for [facility]. The goal of the wellness visit is to assist the patient how to close the gaps in care and create a preventative care plan for the patient.   Taking calcium VIT D as appropriate/Osteoporosis risk reviewed.  Medications reviewed; taking without issues or barriers.  Safety issues reviewed; lives with sister.  Alarm system with smoke and carbon monoxide detectors in the home. No firearms in the home. Wears seatbelts when driving or riding with others. No violence in the home.  No identified risk were noted; The patient was oriented x 3; appropriate in dress and manner and no objective failures at ADL's or IADL's.   BMI; discussed the importance of a healthy diet, water intake and exercise. Educational material provided.  HTN; well managed with medication.  Stable and followed by PCP.  Health maintenance gaps; closed.  Patient Concerns: Requests Rx for FREESTYLE LIBRE glucose monitoring system at upcoming scheduled follow up.    Exercise Activities and Dietary recommendations Current Exercise Habits: Home exercise routine, Intensity: Mild  Goals    . Increase physical activity          Continue physical therapy exercises Chair exercises Walk as tolerated      Fall Risk Fall Risk  01/14/2016 01/29/2015 10/21/2013 10/18/2013 11/08/2012  Falls in the past year?  Yes Yes Yes Yes No  Number falls in past yr: 1 1 1 1  -  Injury with Fall? No No Yes - -  Risk for fall due to : - - Other (Comment) Other (Comment) -  Follow up Education provided;Falls prevention discussed - - - -   Depression Screen PHQ 2/9 Scores 01/14/2016 01/29/2015 10/21/2013 10/18/2013  PHQ - 2 Score 0 0 0 0     Cognitive Function     6CIT Screen 01/14/2016  What Year? 0 points  What month? 0 points  What time? 0 points  Count back from 20 0 points  Months in  reverse 0 points  Repeat phrase 0 points  Total Score 0    Immunization History  Administered Date(s) Administered  . Influenza Split 09/29/2010, 11/09/2011  . Influenza,inj,Quad PF,36+ Mos 11/08/2012  . Pneumococcal Conjugate-13 02/23/2013  . Pneumococcal Polysaccharide-23 11/09/2003  . Tetanus 11/09/2011   Screening Tests Health Maintenance  Topic Date Due  . ZOSTAVAX  02/09/2019 (Originally 07/30/1998)  . INFLUENZA VACCINE  12/09/2021 (Originally 09/10/2015)  . HEMOGLOBIN A1C  06/01/2016  . FOOT EXAM  08/28/2016  . MAMMOGRAM  10/01/2016  . OPHTHALMOLOGY EXAM  12/10/2016  . TETANUS/TDAP  11/08/2021  . DEXA SCAN  Completed  . PNA vac Low Risk Adult  Completed      Plan:    End of life planning; Advance aging; Advanced directives discussed. No HCPOA/Living Will.  Additional information declined.  Medicare Attestation I have personally reviewed: The patient's medical and social history Their use of alcohol, tobacco or illicit drugs Their current medications and supplements The patient's functional ability including ADLs,fall risks, home safety risks, cognitive, and hearing and visual impairment Diet and physical activities Evidence for depression   The patient's weight, height, BMI, and visual acuity have been recorded in the chart.  I have made referrals and provided education to the patient based on review of the above and I have provided the patient with a written personalized care plan for preventive  services.    During the course of the visit the patient was educated and counseled about the following appropriate screening and preventive services:   Vaccines to include Pneumoccal, Influenza, Hepatitis B, Td, Zostavax, HCV  Electrocardiogram  Cardiovascular Disease  Colorectal cancer screening  Bone density screening  Diabetes screening  Glaucoma screening  Mammography/PAP  Nutrition counseling   Patient Instructions (the written plan) was given to the patient.   Varney Biles, LPN  [id number]

## 2016-01-15 NOTE — Therapy (Signed)
[facility] [address] [place], [place], [address] Phone: [phone]   Fax:  [phone]  Physical Therapy Treatment  Patient Details  Name: Brenda Floyd MRN: [id number] Date of Birth: [birth year] Referring Provider: Reche Dixon, PA-C  Encounter Date: 01/14/2016      PT End of Session - 01/15/16 1259    Visit Number 6   Number of Visits 8   Date for PT Re-Evaluation 01/15/16   Authorization - Visit Number 6   Authorization - Number of Visits 10   PT Start Time [phone]   PT Stop Time 1031   PT Time Calculation (min) 53 min   Activity Tolerance Patient tolerated treatment well;Patient limited by pain   Behavior During Therapy [facility] for tasks assessed/performed      Past Medical History:  Diagnosis Date  . Cancer ([place])    skin ca  . Colon polyps 2012   [facility], followup due 2015  . Diabetes mellitus   . Diverticulosis of sigmoid colon July 2012   by colonoscopy  . Fracture closed, femur, shaft ([place]) remote   s/p screws and rod repair  . Hypertension   . Thyroid disease     Past Surgical History:  Procedure Laterality Date  . ABDOMINAL HYSTERECTOMY    . BREAST CYST ASPIRATION Left    neg  . JOINT REPLACEMENT     bilateral knee, 2011    There were no vitals filed for this visit.      Subjective Assessment - 01/15/16 1015    Subjective Pt. entered PT clinic demonstrating L shoulder flexion AROM WFL with pain/discomfort reported at end-range.  Pt. states she has remained active with HEP/ taking care of animals.     Limitations House hold activities;Other (comment)   Patient Stated Goals Increase L shoulder ROM/ strengthening/ pain mgmt.     Currently in Pain? No/denies      TREATMENT  Therapeutic Exercise Seated B UBE forwards/backwards 4 minutes ea. (warm up/no charge).  Standing/supine L shoulder A/AROM in flexion with wand 2 x 10; Cane chest press-up plus with manual resistance for serratus activation  2 x 10; Supine L shoulder rhythmic stabs (min. To moderate resistance) 30 seconds x 2; Seated scapular retraction x 10 with tactile cues for performance; Standing blue Tband low rows 2 x 10; Standing blue Tband straight arm extension to neutral 2 x 10;  Standing yellow Tband L shoulder ER 2 x 10; Supine 2# serratus punches 20x (fatigue noted).   Manual Therapy L shoulder AA/PROM to end range with 5 second hold for flexion, scaption and ER with multiple bouts in all planes; L shoulder superior to inferior grade III mobilizations at 90 flexion, 30 seconds/bout x 3 bouts; L shoulder inferior and posterior medial mobilizations at end range flexion to improve flexion grade III, 30 seconds/bout x 3 bouts Improvement in flexion and ER AROM/PROM following manual therapy,        PT Long Term Goals - 12/23/15 1210      PT LONG TERM GOAL #1   Title Pt. independent with HEP to increase L shoulder AROM to [facility] as compared to R shoulder to improve pain-free mobility.    Baseline R shoulder AROM WNL.  L shoulder AROM (supine/ sitting):  flexion (132 deg./ 129 deg.), abd. (122 deg./92 deg.), ER (53 deg.), IR (80 deg.).    Grip strength: L (43#), R (54#).     Time 4   Period Weeks   Status  New     PT LONG TERM GOAL #2   Title Pt. will increase L shoulder/bicep strength to grossly 4/5 MMT to improve lifting/carrying with household chores.     Baseline 3/5 L shoulder flexion/ abd. (pain).    Time 4   Period Weeks   Status New     PT LONG TERM GOAL #3   Title Pt. will complete QuickDASH and score <20% to improve pain-free mobility with L shoulder/UE tasks.    Baseline TBD   Time 4   Period Weeks   Status New     PT LONG TERM GOAL #4   Title Pt. able to sleep on L side with no increase c/o L shoulder pain.     Baseline pain limited on L shoulder/side at night.    Time 4   Period Weeks   Status New               Plan - 01/15/16 1301    Clinical Impression Statement Pt.  progressing well with L shoulder AROM in supine/ sitting posture with increase L sh. discomfort at end-range flexion/ horizontal abd.  Pt. progressing well with addition of sh./scapular stability ex.  program.  PT will issue a new progressive HEP next tx. session to focus on strengthening/ pain mgmt.    Rehab Potential Good   PT Frequency 2x / week   PT Duration 4 weeks   PT Treatment/Interventions Functional mobility training;Therapeutic activities;Therapeutic exercise;Balance training;Neuromuscular re-education;Patient/family education;Manual techniques;Moist Heat;Electrical Stimulation;Cryotherapy;ADLs/Self Care Home Management;Passive range of motion   PT Next Visit Plan Increase L shoulder ROM/ strengthening.  Functional tasks.  ISSUE NEW HEP   PT Home Exercise Plan As prescribed   Consulted and Agree with Plan of Care Patient      Patient will benefit from skilled therapeutic intervention in order to improve the following deficits and impairments:  Decreased activity tolerance, Decreased endurance, Decreased strength, Improper body mechanics, Postural dysfunction, Obesity, Hypomobility, Pain  Visit Diagnosis: Chronic left shoulder pain  Stiffness of left shoulder joint  Muscle weakness (generalized)  Abnormal posture     Problem List Patient Active Problem List   Diagnosis Date Noted  . Diarrhea in adult patient 12/03/2015  . Chronic pain in left shoulder 12/02/2015  . Family history of breast cancer 08/31/2015  . Vitamin D toxicity 05/25/2015  . Loss of balance 05/25/2015  . Medication adverse effect 04/30/2015  . Encounter for preventive health examination 01/31/2015  . Osteopenia determined by x-ray 08/19/2014  . Pain in joint, ankle and foot 07/28/2014  . Lumbago 07/26/2014  . Hypothyroidism due to acquired atrophy of thyroid 04/28/2014  . Depression with anxiety 04/28/2014  . Vitamin B12 deficiency (non anemic) 10/22/2013  . Statin intolerance 02/26/2013  . S/P  TKR (total knee replacement) 02/25/2013  . Sacroiliac joint disease 02/01/2011  . Hyperlipidemia with target LDL less than 70 11/01/2010  . Diabetes mellitus type 2 in obese ([place])   . Essential hypertension   . Obesity (BMI 30-39.9)   . Diverticulosis of sigmoid colon    Pura Spice, PT, DPT # [id number] 01/15/2016, 1:04 PM  [facility] [address] [place], [place], [address] Phone: [phone]   Fax:  [phone]  Name: TAYCEE SNELLING MRN: [id number] Date of Birth: [birth year]

## 2016-01-16 ENCOUNTER — Ambulatory Visit: Payer: Medicare Other | Admitting: Physical Therapy

## 2016-01-16 ENCOUNTER — Encounter: Payer: Self-pay | Admitting: Physical Therapy

## 2016-01-16 DIAGNOSIS — R293 Abnormal posture: Secondary | ICD-10-CM | POA: Diagnosis not present

## 2016-01-16 DIAGNOSIS — M25512 Pain in left shoulder: Principal | ICD-10-CM

## 2016-01-16 DIAGNOSIS — M25612 Stiffness of left shoulder, not elsewhere classified: Secondary | ICD-10-CM

## 2016-01-16 DIAGNOSIS — M6281 Muscle weakness (generalized): Secondary | ICD-10-CM | POA: Diagnosis not present

## 2016-01-16 DIAGNOSIS — G8929 Other chronic pain: Secondary | ICD-10-CM | POA: Diagnosis not present

## 2016-01-16 DIAGNOSIS — R2689 Other abnormalities of gait and mobility: Secondary | ICD-10-CM | POA: Diagnosis not present

## 2016-01-17 NOTE — Therapy (Addendum)
[facility] [address] [place], [place], [address] Phone: [phone]   Fax:  [phone]  Physical Therapy Treatment  Patient Details  Name: Brenda Floyd MRN: [id number] Date of Birth: [birth year] Referring Provider: Reche Dixon, PA-C  Encounter Date: 01/16/2016      PT End of Session - 01/20/16 1253    Visit Number 8   Number of Visits 14   Date for PT Re-Evaluation 02/13/16   Authorization - Visit Number 8   Authorization - Number of Visits 10   PT Start Time 1107   PT Stop Time 1201   PT Time Calculation (min) 54 min   Activity Tolerance Patient tolerated treatment well;Patient limited by pain   Behavior During Therapy [facility] for tasks assessed/performed      Past Medical History:  Diagnosis Date  . Cancer ([place])    skin ca  . Colon polyps 2012   [facility], followup due 2015  . Diabetes mellitus   . Diverticulosis of sigmoid colon July 2012   by colonoscopy  . Fracture closed, femur, shaft ([place]) remote   s/p screws and rod repair  . Hypertension   . Thyroid disease     Past Surgical History:  Procedure Laterality Date  . ABDOMINAL HYSTERECTOMY    . BREAST CYST ASPIRATION Left    neg  . JOINT REPLACEMENT     bilateral knee, 2011    There were no vitals filed for this visit.      Subjective Assessment - 01/20/16 1251    Subjective Pt. states she did not sleep well last night (only 2-4 hours) and L shoulder is aching this morning.  Pt. states increase in shoulder pain may be a result of colder weather.     Limitations House hold activities;Other (comment)   Patient Stated Goals Increase L shoulder ROM/ strengthening/ pain mgmt.     Currently in Pain? Yes   Pain Score 4    Pain Location Shoulder   Pain Orientation Left   Pain Descriptors / Indicators Aching   Pain Type Chronic pain      TREATMENT  Therapeutic Exercise Seated B UBE forwards/backwards 4 minutes ea. (warm up/no  charge). Standing/supine L shoulder A/AROM in flexion with wand 2 x10; Cane chest press-up plus with manual resistance for serratus activation 2 x 10; Supine L shoulder rhythmic stabs (min. To moderate resistance) 30 seconds x 2; Seated scapular retraction x 10 with tactile cues for performance; Standing yellow Tband L shoulder ER 2 x 10; Nautilus: 30# lat./ 20# tricep/ 20# sh. Add. With handles See new addition to HEP.   Manual Therapy L shoulder AA/PROM to end rangewith 5 second hold for flexion, scaption and ER with multiple bouts in all planes; L shoulder superior to inferior grade III mobilizations at 90 flexion, 30 seconds/bout x 3 bouts; L shoulder inferior and posterior medial mobilizations at end range flexion to improve flexion grade III, 30 seconds/bout x 3 bouts STM to L posterior deltoid/ UT region after there.ex.      Pt. progressing well with L shoulder AROM in supine/ sitting posture with increase L sh. discomfort noted at end-range flexion (140 deg.)/ abd. (125 deg.).  Pt. progressing well with addition of sh./scapular stability ex. program.   Good technique with addition of new strengthening/ stability ex. program.  Generalized muscle tenderness in L shoulder with palpation to posterior deltoid/ UT region.  Pt. will continue to benefit from strengthening ex. program/ focus to improve  pain-free mobility.           PT Long Term Goals - 01/16/16 1444      PT LONG TERM GOAL #1   Title Pt. independent with HEP to increase L shoulder AROM to [facility] as compared to R shoulder to improve pain-free mobility.    Baseline R shoulder AROM WNL.  L shoulder AROM (supine/ sitting):  flexion (145 deg.), abd. (125 deg.), ER (58 deg.), IR (80 deg.).       Time 4   Period Weeks   Status Partially Met     PT LONG TERM GOAL #2   Title Pt. will increase L shoulder/bicep strength to grossly 4/5 MMT to improve lifting/carrying with household chores.     Baseline 3+/5 L shoulder flexion/  abd. (pain).    Time 4   Period Weeks   Status On-going     PT LONG TERM GOAL #3   Title Pt. will complete QuickDASH and score <20% to improve pain-free mobility with L shoulder/UE tasks.    Time 4   Period Weeks   Status On-going     PT LONG TERM GOAL #4   Title Pt. able to sleep on L side with no increase c/o L shoulder pain.     Baseline pain limited on L shoulder/side at night.    Time 4   Period Weeks   Status Not Met               Plan - 01/20/16 1254    Clinical Impression Statement No increase c/o L shoulder pain during tx. session but reports persistent    Rehab Potential Good   PT Frequency 2x / week   PT Duration 4 weeks   PT Treatment/Interventions Functional mobility training;Therapeutic activities;Therapeutic exercise;Balance training;Neuromuscular re-education;Patient/family education;Manual techniques;Moist Heat;Electrical Stimulation;Cryotherapy;ADLs/Self Care Home Management;Passive range of motion   PT Next Visit Plan Increase L shoulder ROM/ strengthening.  Functional tasks.    PT Home Exercise Plan As prescribed   Consulted and Agree with Plan of Care Patient      Patient will benefit from skilled therapeutic intervention in order to improve the following deficits and impairments:  Decreased activity tolerance, Decreased endurance, Decreased strength, Improper body mechanics, Postural dysfunction, Obesity, Hypomobility, Pain  Visit Diagnosis: Chronic left shoulder pain  Stiffness of left shoulder joint  Muscle weakness (generalized)  Abnormal posture     Problem List Patient Active Problem List   Diagnosis Date Noted  . Diarrhea in adult patient 12/03/2015  . Chronic pain in left shoulder 12/02/2015  . Family history of breast cancer 08/31/2015  . Vitamin D toxicity 05/25/2015  . Loss of balance 05/25/2015  . Medication adverse effect 04/30/2015  . Encounter for preventive health examination 01/31/2015  . Osteopenia determined by  x-ray 08/19/2014  . Pain in joint, ankle and foot 07/28/2014  . Lumbago 07/26/2014  . Hypothyroidism due to acquired atrophy of thyroid 04/28/2014  . Depression with anxiety 04/28/2014  . Vitamin B12 deficiency (non anemic) 10/22/2013  . Statin intolerance 02/26/2013  . S/P TKR (total knee replacement) 02/25/2013  . Sacroiliac joint disease 02/01/2011  . Hyperlipidemia with target LDL less than 70 11/01/2010  . Diabetes mellitus type 2 in obese ([place])   . Essential hypertension   . Obesity (BMI 30-39.9)   . Diverticulosis of sigmoid colon    Pura Spice, PT, DPT # [phone]  01/21/2016, 2:46 PM   [facility] [address] [place],  [place], [address] Phone: [phone]   Fax:  [phone]  Name: Brenda Floyd MRN: [id number] Date of Birth: 04/05/1940

## 2016-01-20 ENCOUNTER — Ambulatory Visit: Payer: Medicare Other | Admitting: Physical Therapy

## 2016-01-20 ENCOUNTER — Encounter: Payer: Self-pay | Admitting: Physical Therapy

## 2016-01-20 DIAGNOSIS — M25612 Stiffness of left shoulder, not elsewhere classified: Secondary | ICD-10-CM | POA: Diagnosis not present

## 2016-01-20 DIAGNOSIS — M25512 Pain in left shoulder: Secondary | ICD-10-CM | POA: Diagnosis not present

## 2016-01-20 DIAGNOSIS — R293 Abnormal posture: Secondary | ICD-10-CM

## 2016-01-20 DIAGNOSIS — G8929 Other chronic pain: Secondary | ICD-10-CM

## 2016-01-20 DIAGNOSIS — R2689 Other abnormalities of gait and mobility: Secondary | ICD-10-CM | POA: Diagnosis not present

## 2016-01-20 DIAGNOSIS — M6281 Muscle weakness (generalized): Secondary | ICD-10-CM | POA: Diagnosis not present

## 2016-01-21 NOTE — Therapy (Signed)
[facility] [address] [place], [place], [address] Phone: [phone]   Fax:  [phone]  Physical Therapy Treatment  Patient Details  Name: Brenda Floyd MRN: [id number] Date of Birth: 07-10-1940 Referring Provider: Reche Dixon, PA-C  Encounter Date: 01/20/2016      PT End of Session - 01/20/16 1253    Visit Number 8   Number of Visits 14   Date for PT Re-Evaluation 02/13/16   Authorization - Visit Number 8   Authorization - Number of Visits 16   PT Start Time 1107   PT Stop Time 1201   PT Time Calculation (min) 54 min   Activity Tolerance Patient tolerated treatment well;Patient limited by pain   Behavior During Therapy [facility] for tasks assessed/performed      Past Medical History:  Diagnosis Date  . Cancer ([place])    skin ca  . Colon polyps 2012   [facility], followup due 2015  . Diabetes mellitus   . Diverticulosis of sigmoid colon July 2012   by colonoscopy  . Fracture closed, femur, shaft ([place]) remote   s/p screws and rod repair  . Hypertension   . Thyroid disease     Past Surgical History:  Procedure Laterality Date  . ABDOMINAL HYSTERECTOMY    . BREAST CYST ASPIRATION Left    neg  . JOINT REPLACEMENT     bilateral knee, 2011    There were no vitals filed for this visit.      Subjective Assessment - 01/20/16 1251    Subjective Pt. states she did not sleep well last night (only 2-4 hours) and L shoulder is aching this morning.  Pt. states increase in shoulder pain may be a result of colder weather.     Limitations House hold activities;Other (comment)   Patient Stated Goals Increase L shoulder ROM/ strengthening/ pain mgmt.     Currently in Pain? Yes   Pain Score 4    Pain Location Shoulder   Pain Orientation Left   Pain Descriptors / Indicators Aching   Pain Type Chronic pain       OBJECTIVE: Reviewed HEP.  Seated B UBE 4 min. F/b (consistent cadence/ no charge/warm-up).  Seated 3# bicep curls  20x/ 2# press-ups 10x2 (L sh. Fatigue/ increase discomfort).  Supine L shoulder A/AROM all planes and rhythmic stabs 3x30 sec. All planes with mod. Resistance (pain tolerable).  Supine serratus punches 2# 20x.  Nautilus: lat. 30#/ tricep 20#/ sh. Add. 20#/ scap. Retraction 30# 20x each (min. Verbal cuing for proper technique/ upright posture).  Standing B shoulder AROM flexion/ abd./ ER/ IR.     Pt response for medical necessity:  Moderate muscle fatigue noted in B shoulders after tx. Session, esp. With L shoulder overhead tasks.  Persistent L shoulder discomfort with all there.ex./ resisted tasks.  Pt. More aware of proper technique with ex. To avoid compensatory movement patterns.           PT Long Term Goals - 01/16/16 1444      PT LONG TERM GOAL #1   Title Pt. independent with HEP to increase L shoulder AROM to [facility] as compared to R shoulder to improve pain-free mobility.    Baseline R shoulder AROM WNL.  L shoulder AROM (supine/ sitting):  flexion (145 deg.), abd. (125 deg.), ER (58 deg.), IR (80 deg.).       Time 4   Period Weeks   Status Partially Met     PT  LONG TERM GOAL #2   Title Pt. will increase L shoulder/bicep strength to grossly 4/5 MMT to improve lifting/carrying with household chores.     Baseline 3+/5 L shoulder flexion/ abd. (pain).    Time 4   Period Weeks   Status On-going     PT LONG TERM GOAL #3   Title Pt. will complete QuickDASH and score <20% to improve pain-free mobility with L shoulder/UE tasks.    Time 4   Period Weeks   Status On-going     PT LONG TERM GOAL #4   Title Pt. able to sleep on L side with no increase c/o L shoulder pain.     Baseline pain limited on L shoulder/side at night.    Time 4   Period Weeks   Status Not Met            Plan - 01/20/16 1254    Clinical Impression Statement No increase c/o L shoulder pain during tx. session but reports persistent discomfort.  PT focused on strength training/ stabilization ex. program to  promote improved movement of L shoulder with overhead/ carrying tasks.  Pt. reports 4/10 L post. deltoid tenderness/pain with palpation and eccentric muscle control on Nautilus.  Pt. instructed to ice L shoulder more frequently at home to decrease inflammation/ pain with daily tasks and sleeping position.     Rehab Potential Good   PT Frequency 2x / week   PT Duration 4 weeks   PT Treatment/Interventions Functional mobility training;Therapeutic activities;Therapeutic exercise;Balance training;Neuromuscular re-education;Patient/family education;Manual techniques;Moist Heat;Electrical Stimulation;Cryotherapy;ADLs/Self Care Home Management;Passive range of motion   PT Next Visit Plan Increase L shoulder ROM/ strengthening.  Functional tasks.    PT Home Exercise Plan As prescribed   Consulted and Agree with Plan of Care Patient      Patient will benefit from skilled therapeutic intervention in order to improve the following deficits and impairments:  Decreased activity tolerance, Decreased endurance, Decreased strength, Improper body mechanics, Postural dysfunction, Obesity, Hypomobility, Pain  Visit Diagnosis: Chronic left shoulder pain  Stiffness of left shoulder joint  Muscle weakness (generalized)  Abnormal posture     Problem List Patient Active Problem List   Diagnosis Date Noted  . Diarrhea in adult patient 12/03/2015  . Chronic pain in left shoulder 12/02/2015  . Family history of breast cancer 08/31/2015  . Vitamin D toxicity 05/25/2015  . Loss of balance 05/25/2015  . Medication adverse effect 04/30/2015  . Encounter for preventive health examination 01/31/2015  . Osteopenia determined by x-ray 08/19/2014  . Pain in joint, ankle and foot 07/28/2014  . Lumbago 07/26/2014  . Hypothyroidism due to acquired atrophy of thyroid 04/28/2014  . Depression with anxiety 04/28/2014  . Vitamin B12 deficiency (non anemic) 10/22/2013  . Statin intolerance 02/26/2013  . S/P TKR (total  knee replacement) 02/25/2013  . Sacroiliac joint disease 02/01/2011  . Hyperlipidemia with target LDL less than 70 11/01/2010  . Diabetes mellitus type 2 in obese ([place])   . Essential hypertension   . Obesity (BMI 30-39.9)   . Diverticulosis of sigmoid colon    Pura Spice, PT, DPT # [phone] 01/21/2016, 2:57 PM  [facility] [address] [place], [place], [address] Phone: [phone]   Fax:  [phone]  Name: Brenda Floyd MRN: [id number] Date of Birth: 10/09/1940

## 2016-01-21 NOTE — Addendum Note (Signed)
Addended by: Pura Spice on: 01/21/2016 02:53 PM   Modules accepted: Orders

## 2016-01-21 NOTE — Progress Notes (Signed)
  I have reviewed the above information and agree with above.   Spiro Ausborn, MD 

## 2016-01-22 ENCOUNTER — Ambulatory Visit: Payer: Medicare Other | Admitting: Physical Therapy

## 2016-01-22 DIAGNOSIS — M6281 Muscle weakness (generalized): Secondary | ICD-10-CM | POA: Diagnosis not present

## 2016-01-22 DIAGNOSIS — M25612 Stiffness of left shoulder, not elsewhere classified: Secondary | ICD-10-CM

## 2016-01-22 DIAGNOSIS — G8929 Other chronic pain: Secondary | ICD-10-CM | POA: Diagnosis not present

## 2016-01-22 DIAGNOSIS — R293 Abnormal posture: Secondary | ICD-10-CM

## 2016-01-22 DIAGNOSIS — R2689 Other abnormalities of gait and mobility: Secondary | ICD-10-CM | POA: Diagnosis not present

## 2016-01-22 DIAGNOSIS — M25512 Pain in left shoulder: Principal | ICD-10-CM

## 2016-01-23 NOTE — Therapy (Addendum)
[facility] [address] [place], [place], [address] Phone: [phone]   Fax:  [phone]  Physical Therapy Treatment  Patient Details  Name: Brenda Floyd MRN: [id number] Date of Birth: 30-Dec-1940 Referring Provider: Reche Dixon, PA-C  Encounter Date: 01/22/2016      PT End of Session - 01/23/16 1226    Visit Number 9   Number of Visits 14   Date for PT Re-Evaluation 02/13/16   Authorization - Visit Number 9   Authorization - Number of Visits 16   PT Start Time 1110   PT Stop Time 1206   PT Time Calculation (min) 56 min   Activity Tolerance Patient tolerated treatment well;Patient limited by pain   Behavior During Therapy [facility] for tasks assessed/performed      Past Medical History:  Diagnosis Date  . Cancer ([place])    skin ca  . Colon polyps 2012   [facility], followup due 2015  . Diabetes mellitus   . Diverticulosis of sigmoid colon July 2012   by colonoscopy  . Fracture closed, femur, shaft ([place]) remote   s/p screws and rod repair  . Hypertension   . Thyroid disease     Past Surgical History:  Procedure Laterality Date  . ABDOMINAL HYSTERECTOMY    . BREAST CYST ASPIRATION Left    neg  . JOINT REPLACEMENT     bilateral knee, 2011    There were no vitals filed for this visit.      Subjective Assessment - 01/22/16 1123    Subjective Pt. states she slept much better last night.  Pt. reports lifting 150# of feed for animals (shoulder is sore this morning).     Limitations House hold activities;Other (comment)   Patient Stated Goals Increase L shoulder ROM/ strengthening/ pain mgmt.     Currently in Pain? (P)  No/denies       OBJECTIVE: There.ex.: Seated B UBE 4 min. F/b (consistent cadence/ no charge/warm-up).  Seated 3# bicep curls 20x/ 2# press-ups 10x2 (Min. PT assist on L).  Supine L shoulder A/AROM all planes and rhythmic stabs 3x30 sec. All planes with mod. Resistance (pain tolerable).  Supine  serratus punches 2# 20x.  Nautilus: lat. 30#/ tricep 30#/ sh. Add. 30#/ scap. Retraction 30#/ chest press 20# 20x each (min. Verbal cuing for proper technique/ upright posture).  Standing B shoulder AROM flexion/ abd./ ER/ IR.                Pt response for medical necessity:  Moderate muscle fatigue noted in B shoulders after tx. Session, esp. With L shoulder overhead tasks.  Persistent L shoulder discomfort with all overhead there.ex./ resisted tasks.  Pt. More aware of proper technique with ex. To avoid compensatory movement patterns.      Significant L shoulder crepitus (bone on bone) noted with overhead reaching and eccentric muscle return with Nautilus ex.  Pt. has generalized muscle fatigue with L shoulder ex., esp. overhead ex.  PT discussed benefits of lifting smaller bags of chicken feed to prevent muscle soreness/ pain.         PT Long Term Goals - 01/16/16 1444      PT LONG TERM GOAL #1   Title Pt. independent with HEP to increase L shoulder AROM to [facility] as compared to R shoulder to improve pain-free mobility.    Baseline R shoulder AROM WNL.  L shoulder AROM (supine/ sitting):  flexion (145 deg.), abd. (125 deg.), ER (58 deg.),  IR (80 deg.).       Time 4   Period Weeks   Status Partially Met     PT LONG TERM GOAL #2   Title Pt. will increase L shoulder/bicep strength to grossly 4/5 MMT to improve lifting/carrying with household chores.     Baseline 3+/5 L shoulder flexion/ abd. (pain).    Time 4   Period Weeks   Status On-going     PT LONG TERM GOAL #3   Title Pt. will complete QuickDASH and score <20% to improve pain-free mobility with L shoulder/UE tasks.    Time 4   Period Weeks   Status On-going     PT LONG TERM GOAL #4   Title Pt. able to sleep on L side with no increase c/o L shoulder pain.     Baseline pain limited on L shoulder/side at night.    Time 4   Period Weeks   Status Not Met               Plan - 01/23/16 1227    Rehab Potential Good   PT  Frequency 2x / week   PT Duration 4 weeks   PT Treatment/Interventions Functional mobility training;Therapeutic activities;Therapeutic exercise;Balance training;Neuromuscular re-education;Patient/family education;Manual techniques;Moist Heat;Electrical Stimulation;Cryotherapy;ADLs/Self Care Home Management;Passive range of motion   PT Next Visit Plan Increase L shoulder ROM/ strengthening.  Functional tasks.    PT Home Exercise Plan As prescribed   Consulted and Agree with Plan of Care Patient      Patient will benefit from skilled therapeutic intervention in order to improve the following deficits and impairments:  Decreased activity tolerance, Decreased endurance, Decreased strength, Improper body mechanics, Postural dysfunction, Obesity, Hypomobility, Pain  Visit Diagnosis: Chronic left shoulder pain  Stiffness of left shoulder joint  Muscle weakness (generalized)  Abnormal posture  Other abnormalities of gait and mobility     Problem List Patient Active Problem List   Diagnosis Date Noted  . Diarrhea in adult patient 12/03/2015  . Chronic pain in left shoulder 12/02/2015  . Family history of breast cancer 08/31/2015  . Vitamin D toxicity 05/25/2015  . Loss of balance 05/25/2015  . Medication adverse effect 04/30/2015  . Encounter for preventive health examination 01/31/2015  . Osteopenia determined by x-ray 08/19/2014  . Pain in joint, ankle and foot 07/28/2014  . Lumbago 07/26/2014  . Hypothyroidism due to acquired atrophy of thyroid 04/28/2014  . Depression with anxiety 04/28/2014  . Vitamin B12 deficiency (non anemic) 10/22/2013  . Statin intolerance 02/26/2013  . S/P TKR (total knee replacement) 02/25/2013  . Sacroiliac joint disease 02/01/2011  . Hyperlipidemia with target LDL less than 70 11/01/2010  . Diabetes mellitus type 2 in obese ([place])   . Essential hypertension   . Obesity (BMI 30-39.9)   . Diverticulosis of sigmoid colon    Pura Spice, PT, DPT  # [id number] 01/23/2016, 12:28 PM  [facility] [address] [place], [place], [address] Phone: [phone]   Fax:  [phone]  Name: Brenda Floyd MRN: [id number] Date of Birth: May 16, 1940

## 2016-01-28 ENCOUNTER — Ambulatory Visit: Payer: Medicare Other | Admitting: Physical Therapy

## 2016-01-28 DIAGNOSIS — R2689 Other abnormalities of gait and mobility: Secondary | ICD-10-CM

## 2016-01-28 DIAGNOSIS — M25612 Stiffness of left shoulder, not elsewhere classified: Secondary | ICD-10-CM | POA: Diagnosis not present

## 2016-01-28 DIAGNOSIS — G8929 Other chronic pain: Secondary | ICD-10-CM

## 2016-01-28 DIAGNOSIS — M6281 Muscle weakness (generalized): Secondary | ICD-10-CM | POA: Diagnosis not present

## 2016-01-28 DIAGNOSIS — M25512 Pain in left shoulder: Principal | ICD-10-CM

## 2016-01-28 DIAGNOSIS — R293 Abnormal posture: Secondary | ICD-10-CM | POA: Diagnosis not present

## 2016-01-29 NOTE — Therapy (Addendum)
[facility] [address] [place], [place], [address] Phone: [phone]   Fax:  [phone]  Physical Therapy Treatment  Patient Details  Name: Brenda Floyd MRN: [id number] Date of Birth: 08-12-1940 Referring Provider: Reche Dixon, PA-C  Encounter Date: 01/28/2016      PT End of Session - 01/28/16 1307    Visit Number 10   Number of Visits 14   Date for PT Re-Evaluation 02/13/16   Authorization - Visit Number 10   Authorization - Number of Visits 16   PT Start Time 9935   PT Stop Time 1351   PT Time Calculation (min) 52 min   Activity Tolerance Patient tolerated treatment well;Patient limited by pain   Behavior During Therapy [facility] for tasks assessed/performed      Past Medical History:  Diagnosis Date  . Cancer ([place])    skin ca  . Colon polyps 2012   [facility], followup due 2015  . Diabetes mellitus   . Diverticulosis of sigmoid colon July 2012   by colonoscopy  . Fracture closed, femur, shaft ([place]) remote   s/p screws and rod repair  . Hypertension   . Thyroid disease     Past Surgical History:  Procedure Laterality Date  . ABDOMINAL HYSTERECTOMY    . BREAST CYST ASPIRATION Left    neg  . JOINT REPLACEMENT     bilateral knee, 2011    There were no vitals filed for this visit.      Subjective Assessment - 01/28/16 1300    Subjective Pt. states she is hurting in L shoulder today.  Difficulty hold coffee cup on Sunday morning and having a difficult time getting comfortable at night to sleep.     Limitations House hold activities;Other (comment)   Patient Stated Goals Increase L shoulder ROM/ strengthening/ pain mgmt.     Currently in Pain? Yes   Pain Score 5    Pain Location Shoulder   Pain Orientation Left        OBJECTIVE: There.ex.: Seated B UBE 6 min. F/b (consistent cadence/ no charge/warm-up)- pain with reverse direction.  Standing L shoulder A/AROM (all planes)- as tolerated. Seated 3# bicep  curls 20x/ No press-ups with wt. Today due to pain. Supine L shoulder A/AROM all planes and rhythmic stabs 3x30 sec. All planes with mod. Resistance (pain tolerable). Supine serratus punches 2# 20x.  NoNautilus ex. Today.  R sidelying L sh. Abd./ ER/ scapular mobility.   Pt response for medical necessity: Moderate muscle fatigue noted in B shoulders after tx. Session, esp. With L shoulder overhead tasks. Persistent L shoulder discomfort with all overhead there.ex./ resisted tasks. Pt. More aware of proper technique with ex. To avoid compensatory movement patterns.   Pain limited L shoulder overhead reaching today.  PT unable to progress with any resistive tasks due to pain/ moderate crepitus in sh.  Increase pain during reverse UBE and pt. required use of ice to shoulder after tx.        PT Long Term Goals - 01/16/16 1444      PT LONG TERM GOAL #1   Title Pt. independent with HEP to increase L shoulder AROM to [facility] as compared to R shoulder to improve pain-free mobility.    Baseline R shoulder AROM WNL.  L shoulder AROM (supine/ sitting):  flexion (145 deg.), abd. (125 deg.), ER (58 deg.), IR (80 deg.).       Time 4   Period Weeks   Status  Partially Met     PT LONG TERM GOAL #2   Title Pt. will increase L shoulder/bicep strength to grossly 4/5 MMT to improve lifting/carrying with household chores.     Baseline 3+/5 L shoulder flexion/ abd. (pain).    Time 4   Period Weeks   Status On-going     PT LONG TERM GOAL #3   Title Pt. will complete QuickDASH and score <20% to improve pain-free mobility with L shoulder/UE tasks.    Time 4   Period Weeks   Status On-going     PT LONG TERM GOAL #4   Title Pt. able to sleep on L side with no increase c/o L shoulder pain.     Baseline pain limited on L shoulder/side at night.    Time 4   Period Weeks   Status Not Met               Plan - 01/28/16 1307    Rehab Potential Good   PT Frequency 2x / week   PT  Treatment/Interventions Functional mobility training;Therapeutic activities;Therapeutic exercise;Balance training;Neuromuscular re-education;Patient/family education;Manual techniques;Moist Heat;Electrical Stimulation;Cryotherapy;ADLs/Self Care Home Management;Passive range of motion   PT Next Visit Plan Increase L shoulder ROM/ strengthening.  Functional tasks.    PT Home Exercise Plan As prescribed   Consulted and Agree with Plan of Care Patient      Patient will benefit from skilled therapeutic intervention in order to improve the following deficits and impairments:  Decreased activity tolerance, Decreased endurance, Decreased strength, Improper body mechanics, Postural dysfunction, Obesity, Hypomobility, Pain  Visit Diagnosis: Chronic left shoulder pain  Stiffness of left shoulder joint  Muscle weakness (generalized)  Abnormal posture  Other abnormalities of gait and mobility     Problem List Patient Active Problem List   Diagnosis Date Noted  . Diarrhea in adult patient 12/03/2015  . Chronic pain in left shoulder 12/02/2015  . Family history of breast cancer 08/31/2015  . Vitamin D toxicity 05/25/2015  . Loss of balance 05/25/2015  . Medication adverse effect 04/30/2015  . Encounter for preventive health examination 01/31/2015  . Osteopenia determined by x-ray 08/19/2014  . Pain in joint, ankle and foot 07/28/2014  . Lumbago 07/26/2014  . Hypothyroidism due to acquired atrophy of thyroid 04/28/2014  . Depression with anxiety 04/28/2014  . Vitamin B12 deficiency (non anemic) 10/22/2013  . Statin intolerance 02/26/2013  . S/P TKR (total knee replacement) 02/25/2013  . Sacroiliac joint disease 02/01/2011  . Hyperlipidemia with target LDL less than 70 11/01/2010  . Diabetes mellitus type 2 in obese ([place])   . Essential hypertension   . Obesity (BMI 30-39.9)   . Diverticulosis of sigmoid colon    Pura Spice, PT, DPT # [phone] 01/29/2016, 12:38 PM  [facility] [address] [place], [place], [address] Phone: [phone]   Fax:  [phone]  Name: Brenda Floyd MRN: [id number] Date of Birth: August 24, 1940

## 2016-01-30 ENCOUNTER — Ambulatory Visit: Payer: Medicare Other | Admitting: Physical Therapy

## 2016-01-30 DIAGNOSIS — M6281 Muscle weakness (generalized): Secondary | ICD-10-CM

## 2016-01-30 DIAGNOSIS — M25612 Stiffness of left shoulder, not elsewhere classified: Secondary | ICD-10-CM

## 2016-01-30 DIAGNOSIS — R2689 Other abnormalities of gait and mobility: Secondary | ICD-10-CM | POA: Diagnosis not present

## 2016-01-30 DIAGNOSIS — M25512 Pain in left shoulder: Principal | ICD-10-CM

## 2016-01-30 DIAGNOSIS — R293 Abnormal posture: Secondary | ICD-10-CM

## 2016-01-30 DIAGNOSIS — G8929 Other chronic pain: Secondary | ICD-10-CM

## 2016-01-31 NOTE — Therapy (Addendum)
[facility] [address] [place], [place], [address] Phone: [phone]   Fax:  [phone]  Physical Therapy Treatment  Patient Details  Name: Brenda Floyd MRN: [id number] Date of Birth: [birth year] Referring Provider: Reche Dixon, PA-C  Encounter Date: 01/30/2016      PT End of Session - 01/31/16 1141    Visit Number 11   Number of Visits 14   Date for PT Re-Evaluation 02/13/16   Authorization - Visit Number 11   Authorization - Number of Visits 16   PT Start Time 6144   PT Stop Time 1356   PT Time Calculation (min) 54 min   Activity Tolerance Patient tolerated treatment well;Patient limited by pain   Behavior During Therapy [facility] for tasks assessed/performed      Past Medical History:  Diagnosis Date  . Cancer ([place])    skin ca  . Colon polyps 2012   [facility], followup due 2015  . Diabetes mellitus   . Diverticulosis of sigmoid colon July 2012   by colonoscopy  . Fracture closed, femur, shaft ([place]) remote   s/p screws and rod repair  . Hypertension   . Thyroid disease     Past Surgical History:  Procedure Laterality Date  . ABDOMINAL HYSTERECTOMY    . BREAST CYST ASPIRATION Left    neg  . JOINT REPLACEMENT     bilateral knee, 2011    There were no vitals filed for this visit.      Subjective Assessment - 01/31/16 1141    Limitations House hold activities;Other (comment)   Patient Stated Goals Increase L shoulder ROM/ strengthening/ pain mgmt.     Currently in Pain? Yes      OBJECTIVE: There.ex.:  Seated B UBE 3 min. F/b.  See new HEP (reviewed HEP in depth)- YTB to RTB.  Nautilus: lat. Pull down 30#/ sh. Adduction 20#/ tricep 30#/ scap. Retraction 30# 25x each (min. Cuing for technique).  Supine L/ R shoulder isometrices IR and ER 10x10 sec. Holds ("soreness" reported).  Manual tx.:  Supine L shoulder AA/PROM/ stretches all planes.  L shoulder grade II-III AP/PA mobs. 3x20 sec.  STM to L shoulder/  biceps region.      Pt response for medical necessity: pt. Benefits from progressive L shoulder ROM/ stability ex. Program to promote improved mobility.    Pt. limited with L shoulder horizontal adduction/ scaption stretches in supine position due to discomfort/impingement.  Pt. works hard during PT tx. session with minimal discomfort/soreness after tx. session.  Good understanding of new HEP with demonstration of proper technique.          PT Long Term Goals - 01/16/16 1444      PT LONG TERM GOAL #1   Title Pt. independent with HEP to increase L shoulder AROM to [facility] as compared to R shoulder to improve pain-free mobility.    Baseline R shoulder AROM WNL.  L shoulder AROM (supine/ sitting):  flexion (145 deg.), abd. (125 deg.), ER (58 deg.), IR (80 deg.).       Time 4   Period Weeks   Status Partially Met     PT LONG TERM GOAL #2   Title Pt. will increase L shoulder/bicep strength to grossly 4/5 MMT to improve lifting/carrying with household chores.     Baseline 3+/5 L shoulder flexion/ abd. (pain).    Time 4   Period Weeks   Status On-going     PT LONG TERM GOAL #  3   Title Pt. will complete QuickDASH and score <20% to improve pain-free mobility with L shoulder/UE tasks.    Time 4   Period Weeks   Status On-going     PT LONG TERM GOAL #4   Title Pt. able to sleep on L side with no increase c/o L shoulder pain.     Baseline pain limited on L shoulder/side at night.    Time 4   Period Weeks   Status Not Met            Plan - 01/31/16 1142    Rehab Potential Good   PT Frequency 2x / week   PT Treatment/Interventions Functional mobility training;Therapeutic activities;Therapeutic exercise;Balance training;Neuromuscular re-education;Patient/family education;Manual techniques;Moist Heat;Electrical Stimulation;Cryotherapy;ADLs/Self Care Home Management;Passive range of motion   PT Next Visit Plan Increase L shoulder ROM/ strengthening.  Functional tasks.    PT Home  Exercise Plan As prescribed   Consulted and Agree with Plan of Care Patient      Patient will benefit from skilled therapeutic intervention in order to improve the following deficits and impairments:  Decreased activity tolerance, Decreased endurance, Decreased strength, Improper body mechanics, Postural dysfunction, Obesity, Hypomobility, Pain  Visit Diagnosis: Chronic left shoulder pain  Stiffness of left shoulder joint  Muscle weakness (generalized)  Abnormal posture     Problem List Patient Active Problem List   Diagnosis Date Noted  . Diarrhea in adult patient 12/03/2015  . Chronic pain in left shoulder 12/02/2015  . Family history of breast cancer 08/31/2015  . Vitamin D toxicity 05/25/2015  . Loss of balance 05/25/2015  . Medication adverse effect 04/30/2015  . Encounter for preventive health examination 01/31/2015  . Osteopenia determined by x-ray 08/19/2014  . Pain in joint, ankle and foot 07/28/2014  . Lumbago 07/26/2014  . Hypothyroidism due to acquired atrophy of thyroid 04/28/2014  . Depression with anxiety 04/28/2014  . Vitamin B12 deficiency (non anemic) 10/22/2013  . Statin intolerance 02/26/2013  . S/P TKR (total knee replacement) 02/25/2013  . Sacroiliac joint disease 02/01/2011  . Hyperlipidemia with target LDL less than 70 11/01/2010  . Diabetes mellitus type 2 in obese ([place])   . Essential hypertension   . Obesity (BMI 30-39.9)   . Diverticulosis of sigmoid colon    Pura Spice, PT, DPT # [id number] 01/31/2016, 11:43 AM  [facility] [address] [place], [place], [address] Phone: [phone]   Fax:  [phone]  Name: Brenda Floyd MRN: [id number] Date of Birth: 02-22-1940

## 2016-02-04 ENCOUNTER — Ambulatory Visit: Payer: Medicare Other | Admitting: Physical Therapy

## 2016-02-04 DIAGNOSIS — R2689 Other abnormalities of gait and mobility: Secondary | ICD-10-CM | POA: Diagnosis not present

## 2016-02-04 DIAGNOSIS — R293 Abnormal posture: Secondary | ICD-10-CM | POA: Diagnosis not present

## 2016-02-04 DIAGNOSIS — M25512 Pain in left shoulder: Secondary | ICD-10-CM | POA: Diagnosis not present

## 2016-02-04 DIAGNOSIS — M6281 Muscle weakness (generalized): Secondary | ICD-10-CM | POA: Diagnosis not present

## 2016-02-04 DIAGNOSIS — M25612 Stiffness of left shoulder, not elsewhere classified: Secondary | ICD-10-CM

## 2016-02-04 DIAGNOSIS — G8929 Other chronic pain: Secondary | ICD-10-CM

## 2016-02-04 NOTE — Therapy (Signed)
[facility] [address] [place], [place], [address] Phone: [phone]   Fax:  [phone]  Physical Therapy Treatment  Patient Details  Name: Brenda Floyd MRN: [id number] Date of Birth: 1941-01-15 Referring Provider: Reche Dixon, PA-C  Encounter Date: 02/04/2016      PT End of Session - 02/05/16 0801    Visit Number 12   Number of Visits 14   Date for PT Re-Evaluation 02/13/16   Authorization - Visit Number 12   Authorization - Number of Visits 16   PT Start Time 7741   PT Stop Time 2878   PT Time Calculation (min) 55 min   Activity Tolerance Patient tolerated treatment well;Patient limited by pain   Behavior During Therapy [facility] for tasks assessed/performed      Past Medical History:  Diagnosis Date  . Cancer ([place])    skin ca  . Colon polyps 2012   [facility], followup due 2015  . Diabetes mellitus   . Diverticulosis of sigmoid colon July 2012   by colonoscopy  . Fracture closed, femur, shaft ([place]) remote   s/p screws and rod repair  . Hypertension   . Thyroid disease     Past Surgical History:  Procedure Laterality Date  . ABDOMINAL HYSTERECTOMY    . BREAST CYST ASPIRATION Left    neg  . JOINT REPLACEMENT     bilateral knee, 2011    There were no vitals filed for this visit.      Subjective Assessment - 02/04/16 1307    Subjective Pt. states she is sleeping better and using L shoulder with daily tasks.  Pt. still has discomfort/pain with certain overhead movement patterns.     Limitations House hold activities;Other (comment)   Patient Stated Goals Increase L shoulder ROM/ strengthening/ pain mgmt.     Currently in Pain? Yes   Pain Score 3    Pain Location Shoulder   Pain Orientation Left   Pain Descriptors / Indicators Aching   Pain Type Chronic pain      There.ex.:  Nautilus: 30# lat. Pull downs/ 30# tricep ext./ 30# single wand cross body diagonals 10x each due to difficulty/ 20# standing sh.  Extension at 90 deg./ 20# chest press 20x each (verbal/tactile cuing for correction of technique).  Seated 2# press-ups 10x (limited by fatigue/ L shoulder discomfort)/ punches 10x with 2# wt. And without.  Discussed theraband ex. At home (limited compliance reported).  Seated B UBE 2 min. F/b (no pain).  Standing B shoulder AROM all planes and overhead reaching to 2nd shelf without wt.     Pt response for medical necessity:  Benefits from progressive L shoulder/UE strengthening ex. Program to improve pain-free functional mobility.  Cuing required to prevent compensatory movement patterns.  Generalized tenderness with palpation over deltoid/ prox. Biceps musculature.         PT Long Term Goals - 01/16/16 1444      PT LONG TERM GOAL #1   Title Pt. independent with HEP to increase L shoulder AROM to [facility] as compared to R shoulder to improve pain-free mobility.    Baseline R shoulder AROM WNL.  L shoulder AROM (supine/ sitting):  flexion (145 deg.), abd. (125 deg.), ER (58 deg.), IR (80 deg.).       Time 4   Period Weeks   Status Partially Met     PT LONG TERM GOAL #2   Title Pt. will increase L shoulder/bicep strength to grossly 4/5  MMT to improve lifting/carrying with household chores.     Baseline 3+/5 L shoulder flexion/ abd. (pain).    Time 4   Period Weeks   Status On-going     PT LONG TERM GOAL #3   Title Pt. will complete QuickDASH and score <20% to improve pain-free mobility with L shoulder/UE tasks.    Time 4   Period Weeks   Status On-going     PT LONG TERM GOAL #4   Title Pt. able to sleep on L side with no increase c/o L shoulder pain.     Baseline pain limited on L shoulder/side at night.    Time 4   Period Weeks   Status Not Met            Plan - 02/05/16 0802    Clinical Impression Statement Pt. demonstrates functional L shoulder flexion/ abd. AROM without use of wt.  Moderate L shoulder fatigue with slight UT/lateral lean compensatory movement patterns with use  of >2# wts.  Good scapular mobility but moderate crepitus/ popping in L shoulder with overhead/repetitive movement patterns.     Rehab Potential Good   PT Frequency 2x / week   PT Treatment/Interventions Functional mobility training;Therapeutic activities;Therapeutic exercise;Balance training;Neuromuscular re-education;Patient/family education;Manual techniques;Moist Heat;Electrical Stimulation;Cryotherapy;ADLs/Self Care Home Management;Passive range of motion   PT Next Visit Plan Increase L shoulder ROM/ strengthening.  Functional tasks.   ISSUE MORE STRENGTHENING EX.     PT Home Exercise Plan As prescribed   Consulted and Agree with Plan of Care Patient      Patient will benefit from skilled therapeutic intervention in order to improve the following deficits and impairments:  Decreased activity tolerance, Decreased endurance, Decreased strength, Improper body mechanics, Postural dysfunction, Obesity, Hypomobility, Pain  Visit Diagnosis: Chronic left shoulder pain  Stiffness of left shoulder joint  Muscle weakness (generalized)  Abnormal posture     Problem List Patient Active Problem List   Diagnosis Date Noted  . Diarrhea in adult patient 12/03/2015  . Chronic pain in left shoulder 12/02/2015  . Family history of breast cancer 08/31/2015  . Vitamin D toxicity 05/25/2015  . Loss of balance 05/25/2015  . Medication adverse effect 04/30/2015  . Encounter for preventive health examination 01/31/2015  . Osteopenia determined by x-ray 08/19/2014  . Pain in joint, ankle and foot 07/28/2014  . Lumbago 07/26/2014  . Hypothyroidism due to acquired atrophy of thyroid 04/28/2014  . Depression with anxiety 04/28/2014  . Vitamin B12 deficiency (non anemic) 10/22/2013  . Statin intolerance 02/26/2013  . S/P TKR (total knee replacement) 02/25/2013  . Sacroiliac joint disease 02/01/2011  . Hyperlipidemia with target LDL less than 70 11/01/2010  . Diabetes mellitus type 2 in obese ([place])    . Essential hypertension   . Obesity (BMI 30-39.9)   . Diverticulosis of sigmoid colon    Pura Spice, PT, DPT # [id number] 02/05/2016, 8:04 AM  [facility] [address] [place], [place], [address] Phone: [phone]   Fax:  [phone]  Name: Brenda Floyd MRN: [id number] Date of Birth: Aug 09, 1940

## 2016-02-06 ENCOUNTER — Encounter: Payer: Self-pay | Admitting: Physical Therapy

## 2016-02-06 ENCOUNTER — Ambulatory Visit: Payer: Medicare Other | Admitting: Physical Therapy

## 2016-02-06 ENCOUNTER — Telehealth: Payer: Self-pay | Admitting: Internal Medicine

## 2016-02-06 DIAGNOSIS — G8929 Other chronic pain: Secondary | ICD-10-CM

## 2016-02-06 DIAGNOSIS — M25612 Stiffness of left shoulder, not elsewhere classified: Secondary | ICD-10-CM | POA: Diagnosis not present

## 2016-02-06 DIAGNOSIS — M6281 Muscle weakness (generalized): Secondary | ICD-10-CM | POA: Diagnosis not present

## 2016-02-06 DIAGNOSIS — M25512 Pain in left shoulder: Principal | ICD-10-CM

## 2016-02-06 DIAGNOSIS — R293 Abnormal posture: Secondary | ICD-10-CM

## 2016-02-06 DIAGNOSIS — R2689 Other abnormalities of gait and mobility: Secondary | ICD-10-CM | POA: Diagnosis not present

## 2016-02-06 NOTE — Telephone Encounter (Signed)
[place] from [employer] called in regards to a new prescription for [employer] monitor. Please advise, thank you!  Phone @ [phone]

## 2016-02-06 NOTE — Therapy (Signed)
[facility] [facility] [address] [place], [place], [address] Phone: [phone]   Fax:  [phone]  Physical Therapy Treatment  Patient Details  Name: Brenda Floyd MRN: [id number] Date of Birth: 12/21/1940 Referring Provider: Todd Mundy, PA-C  Encounter Date: 02/06/2016      PT End of Session - 02/06/16 1757    Visit Number 13   Number of Visits 14   Date for PT Re-Evaluation 02/13/16   Authorization - Visit Number 13   Authorization - Number of Visits 16   PT Start Time 1053   PT Stop Time 1149   PT Time Calculation (min) 56 min   Activity Tolerance Patient tolerated treatment well;Patient limited by pain   Behavior During Therapy [facility] for tasks assessed/performed      Past Medical History:  Diagnosis Date  . Cancer ([facility])    skin ca  . Colon polyps 2012   [facility], followup due 2015  . Diabetes mellitus   . Diverticulosis of sigmoid colon July 2012   by colonoscopy  . Fracture closed, femur, shaft ([facility]) remote   s/p screws and rod repair  . Hypertension   . Thyroid disease     Past Surgical History:  Procedure Laterality Date  . ABDOMINAL HYSTERECTOMY    . BREAST CYST ASPIRATION Left    neg  . JOINT REPLACEMENT     bilateral knee, 2011    There were no vitals filed for this visit.      Subjective Assessment - 02/06/16 1756    Limitations House hold activities;Other (comment)   Patient Stated Goals Increase L shoulder ROM/ strengthening/ pain mgmt.     Currently in Pain? Yes   Pain Score 3    Pain Location Shoulder   Pain Orientation Left                                      PT Long Term Goals - 01/16/16 1444      PT LONG TERM GOAL #1   Title Pt. independent with HEP to increase L shoulder AROM to [facility] as compared to R shoulder to improve pain-free mobility.    Baseline R shoulder AROM WNL.  L shoulder AROM (supine/ sitting):  flexion (145 deg.), abd. (125 deg.), ER (58  deg.), IR (80 deg.).       Time 4   Period Weeks   Status Partially Met     PT LONG TERM GOAL #2   Title Pt. will increase L shoulder/bicep strength to grossly 4/5 MMT to improve lifting/carrying with household chores.     Baseline 3+/5 L shoulder flexion/ abd. (pain).    Time 4   Period Weeks   Status On-going     PT LONG TERM GOAL #3   Title Pt. will complete QuickDASH and score <20% to improve pain-free mobility with L shoulder/UE tasks.    Time 4   Period Weeks   Status On-going     PT LONG TERM GOAL #4   Title Pt. able to sleep on L side with no increase c/o L shoulder pain.     Baseline pain limited on L shoulder/side at night.    Time 4   Period Weeks   Status Not Met               Plan - 02/06/16 1757    Rehab Potential Good     PT Frequency 2x / week   PT Duration 4 weeks   PT Treatment/Interventions Functional mobility training;Therapeutic activities;Therapeutic exercise;Balance training;Neuromuscular re-education;Patient/family education;Manual techniques;Moist Heat;Electrical Stimulation;Cryotherapy;ADLs/Self Care Home Management;Passive range of motion   PT Next Visit Plan Increase L shoulder ROM/ strengthening.  Functional tasks.      PT Home Exercise Plan As prescribed   Consulted and Agree with Plan of Care Patient      Patient will benefit from skilled therapeutic intervention in order to improve the following deficits and impairments:  Decreased activity tolerance, Decreased endurance, Decreased strength, Improper body mechanics, Postural dysfunction, Obesity, Hypomobility, Pain  Visit Diagnosis: Chronic left shoulder pain  Stiffness of left shoulder joint  Muscle weakness (generalized)  Abnormal posture  Other abnormalities of gait and mobility     Problem List Patient Active Problem List   Diagnosis Date Noted  . Diarrhea in adult patient 12/03/2015  . Chronic pain in left shoulder 12/02/2015  . Family history of breast cancer  08/31/2015  . Vitamin D toxicity 05/25/2015  . Loss of balance 05/25/2015  . Medication adverse effect 04/30/2015  . Encounter for preventive health examination 01/31/2015  . Osteopenia determined by x-ray 08/19/2014  . Pain in joint, ankle and foot 07/28/2014  . Lumbago 07/26/2014  . Hypothyroidism due to acquired atrophy of thyroid 04/28/2014  . Depression with anxiety 04/28/2014  . Vitamin B12 deficiency (non anemic) 10/22/2013  . Statin intolerance 02/26/2013  . S/P TKR (total knee replacement) 02/25/2013  . Sacroiliac joint disease 02/01/2011  . Hyperlipidemia with target LDL less than 70 11/01/2010  . Diabetes mellitus type 2 in obese ([place])   . Essential hypertension   . Obesity (BMI 30-39.9)   . Diverticulosis of sigmoid colon     Pura Spice 02/06/2016, 5:58 PM  [facility] [address] [place], [place], [address] Phone: [phone]   Fax:  [phone]  Name: VARIE MACHAMER MRN: [id number] Date of Birth: [birth year]

## 2016-02-12 ENCOUNTER — Ambulatory Visit: Payer: Medicare Other | Attending: Orthopedic Surgery | Admitting: Physical Therapy

## 2016-02-12 DIAGNOSIS — M25612 Stiffness of left shoulder, not elsewhere classified: Secondary | ICD-10-CM | POA: Diagnosis not present

## 2016-02-12 DIAGNOSIS — M6281 Muscle weakness (generalized): Secondary | ICD-10-CM | POA: Insufficient documentation

## 2016-02-12 DIAGNOSIS — R293 Abnormal posture: Secondary | ICD-10-CM

## 2016-02-12 DIAGNOSIS — M25512 Pain in left shoulder: Secondary | ICD-10-CM | POA: Diagnosis not present

## 2016-02-12 DIAGNOSIS — G8929 Other chronic pain: Secondary | ICD-10-CM | POA: Insufficient documentation

## 2016-02-12 MED ORDER — BLOOD GLUCOSE MONITORING SUPPL DEVI: 0 refills | Status: DC

## 2016-02-12 NOTE — Telephone Encounter (Signed)
rx printed.  Please fax  To new pharmacy  and update pharmacy in chart   Thanks

## 2016-02-13 NOTE — Telephone Encounter (Signed)
Faxed over [facility]GM to [facility] at [phone]

## 2016-02-13 NOTE — Therapy (Signed)
[place] [facility] [address] [place], [place], [address] Phone: [phone]   Fax:  [phone]  Physical Therapy Treatment  Patient Details  Name: Brenda Floyd MRN: [id number] Date of Birth: 1940-12-22 Referring Provider: Reche Dixon, PA-C  Encounter Date: 02/12/2016      PT End of Session - 02/13/16 1833    Visit Number 14   Number of Visits 14   Date for PT Re-Evaluation 02/13/16   Authorization - Visit Number 14   Authorization - Number of Visits 16   PT Start Time 7322   PT Stop Time 1515   PT Time Calculation (min) 52 min   Activity Tolerance Patient tolerated treatment well;Patient limited by pain   Behavior During Therapy [facility] for tasks assessed/performed      Past Medical History:  Diagnosis Date  . Cancer ([place])    skin ca  . Colon polyps 2012   [facility], followup due 2015  . Diabetes mellitus   . Diverticulosis of sigmoid colon July 2012   by colonoscopy  . Fracture closed, femur, shaft ([place]) remote   s/p screws and rod repair  . Hypertension   . Thyroid disease     Past Surgical History:  Procedure Laterality Date  . ABDOMINAL HYSTERECTOMY    . BREAST CYST ASPIRATION Left    neg  . JOINT REPLACEMENT     bilateral knee, 2011    There were no vitals filed for this visit.      Subjective Assessment - 02/13/16 1833    Limitations House hold activities;Other (comment)   Patient Stated Goals Increase L shoulder ROM/ strengthening/ pain mgmt.     Currently in Pain? Yes                                      PT Long Term Goals - 01/16/16 1444      PT LONG TERM GOAL #1   Title Pt. independent with HEP to increase L shoulder AROM to [facility] as compared to R shoulder to improve pain-free mobility.    Baseline R shoulder AROM WNL.  L shoulder AROM (supine/ sitting):  flexion (145 deg.), abd. (125 deg.), ER (58 deg.), IR (80 deg.).       Time 4   Period Weeks   Status Partially  Met     PT LONG TERM GOAL #2   Title Pt. will increase L shoulder/bicep strength to grossly 4/5 MMT to improve lifting/carrying with household chores.     Baseline 3+/5 L shoulder flexion/ abd. (pain).    Time 4   Period Weeks   Status On-going     PT LONG TERM GOAL #3   Title Pt. will complete QuickDASH and score <20% to improve pain-free mobility with L shoulder/UE tasks.    Time 4   Period Weeks   Status On-going     PT LONG TERM GOAL #4   Title Pt. able to sleep on L side with no increase c/o L shoulder pain.     Baseline pain limited on L shoulder/side at night.    Time 4   Period Weeks   Status Not Met               Plan - 02/13/16 1834    Rehab Potential Good   PT Frequency 2x / week   PT Duration 4 weeks   PT Treatment/Interventions  Functional mobility training;Therapeutic activities;Therapeutic exercise;Balance training;Neuromuscular re-education;Patient/family education;Manual techniques;Moist Heat;Electrical Stimulation;Cryotherapy;ADLs/Self Care Home Management;Passive range of motion   PT Next Visit Plan Increase L shoulder ROM/ strengthening.  Functional tasks.      PT Home Exercise Plan As prescribed      Patient will benefit from skilled therapeutic intervention in order to improve the following deficits and impairments:  Decreased activity tolerance, Decreased endurance, Decreased strength, Improper body mechanics, Postural dysfunction, Obesity, Hypomobility, Pain  Visit Diagnosis: Chronic left shoulder pain  Stiffness of left shoulder joint  Muscle weakness (generalized)  Abnormal posture     Problem List Patient Active Problem List   Diagnosis Date Noted  . Diarrhea in adult patient 12/03/2015  . Chronic pain in left shoulder 12/02/2015  . Family history of breast cancer 08/31/2015  . Vitamin D toxicity 05/25/2015  . Loss of balance 05/25/2015  . Medication adverse effect 04/30/2015  . Encounter for preventive health examination  01/31/2015  . Osteopenia determined by x-ray 08/19/2014  . Pain in joint, ankle and foot 07/28/2014  . Lumbago 07/26/2014  . Hypothyroidism due to acquired atrophy of thyroid 04/28/2014  . Depression with anxiety 04/28/2014  . Vitamin B12 deficiency (non anemic) 10/22/2013  . Statin intolerance 02/26/2013  . S/P TKR (total knee replacement) 02/25/2013  . Sacroiliac joint disease 02/01/2011  . Hyperlipidemia with target LDL less than 70 11/01/2010  . Diabetes mellitus type 2 in obese ([place])   . Essential hypertension   . Obesity (BMI 30-39.9)   . Diverticulosis of sigmoid colon     Pura Spice 02/13/2016, 6:35 PM  [place] [facility] [address] [place], [place], [address] Phone: [phone]   Fax:  [phone]  Name: Brenda Floyd MRN: [id number] Date of Birth: [birth year]

## 2016-02-19 ENCOUNTER — Ambulatory Visit: Payer: Medicare Other | Admitting: Physical Therapy

## 2016-02-19 ENCOUNTER — Encounter: Payer: Self-pay | Admitting: Physical Therapy

## 2016-02-19 DIAGNOSIS — M25512 Pain in left shoulder: Secondary | ICD-10-CM | POA: Diagnosis not present

## 2016-02-19 DIAGNOSIS — M25612 Stiffness of left shoulder, not elsewhere classified: Secondary | ICD-10-CM

## 2016-02-19 DIAGNOSIS — G8929 Other chronic pain: Secondary | ICD-10-CM | POA: Diagnosis not present

## 2016-02-19 DIAGNOSIS — M6281 Muscle weakness (generalized): Secondary | ICD-10-CM

## 2016-02-19 DIAGNOSIS — R293 Abnormal posture: Secondary | ICD-10-CM | POA: Diagnosis not present

## 2016-02-19 NOTE — Therapy (Signed)
[facility] [address] [place], [place], [address] Phone: [phone]   Fax:  [phone]  Physical Therapy Treatment  Patient Details  Name: Brenda Floyd MRN: [id number] Date of Birth: 07-Jun-1940 Referring Provider: Reche Dixon, PA-C  Encounter Date: 02/19/2016      PT End of Session - 02/20/16 1428    Visit Number 15   Number of Visits 22   Date for PT Re-Evaluation 03/18/16   Authorization - Visit Number 15   Authorization - Number of Visits 24   PT Start Time 8527   PT Stop Time 1345   PT Time Calculation (min) 54 min   Activity Tolerance Patient tolerated treatment well;Patient limited by pain   Behavior During Therapy [facility] for tasks assessed/performed      Past Medical History:  Diagnosis Date  . Cancer ([place])    skin ca  . Colon polyps 2012   [place], followup due 2015  . Diabetes mellitus   . Diverticulosis of sigmoid colon July 2012   by colonoscopy  . Fracture closed, femur, shaft ([place]) remote   s/p screws and rod repair  . Hypertension   . Thyroid disease     Past Surgical History:  Procedure Laterality Date  . ABDOMINAL HYSTERECTOMY    . BREAST CYST ASPIRATION Left    neg  . JOINT REPLACEMENT     bilateral knee, 2011    There were no vitals filed for this visit.      Subjective Assessment - 02/19/16 1305    Subjective Pt. states she has been active cleaning up a broken water pipe mess.  Pt. reports doing well after last tx. session.     Limitations House hold activities;Other (comment)   Patient Stated Goals Increase L shoulder ROM/ strengthening/ pain mgmt.     Currently in Pain? Yes   Pain Score 4    Pain Location Shoulder   Pain Orientation Left   Pain Descriptors / Indicators Aching     QuickDASH:   There.ex.: Manual tx: supine L shoulder AAROM (all planes)- pain tolerable.  Therex.: Supine R shoulder isometrics (all planes).  Supine serratus punches.  Nautilus: 30# lat. Pull  downs/ 30# tricep ext./ 30# standing sh. Extension at 90 deg./ 30# scap. retraction.  Seated 2# press-ups 10x (limited by fatigue/ L shoulder discomfort)/ punches 10x with 2# wt. And without.  Discussed theraband ex. At home (limited compliance reported).  Seated B UBE 2 min. F/b (no pain).  Standing B shoulder AROM all planes and overhead reaching to 2nd shelf without wt.                Pt response for medical necessity:  Benefits from progressive L shoulder/UE strengthening ex. Program to improve pain-free functional mobility.  Cuing required to prevent compensatory movement patterns.  Generalized tenderness with palpation over deltoid/ prox. Biceps musculature.           PT Long Term Goals - 01/16/16 1444      PT LONG TERM GOAL #1   Title Pt. independent with HEP to increase L shoulder AROM to [facility] as compared to R shoulder to improve pain-free mobility.    Baseline R shoulder AROM WNL.  L shoulder AROM (supine/ sitting):  flexion (145 deg.), abd. (125 deg.), ER (58 deg.), IR (80 deg.).       Time 4   Period Weeks   Status Partially Met     PT LONG TERM GOAL #2  Title Pt. will increase L shoulder/bicep strength to grossly 4/5 MMT to improve lifting/carrying with household chores.     Baseline 3+/5 L shoulder flexion/ abd. (pain).    Time 4   Period Weeks   Status On-going     PT LONG TERM GOAL #3   Title Pt. will complete QuickDASH and score <20% to improve pain-free mobility with L shoulder/UE tasks.    Time 4   Period Weeks   Status On-going     PT LONG TERM GOAL #4   Title Pt. able to sleep on L side with no increase c/o L shoulder pain.     Baseline pain limited on L shoulder/side at night.    Time 4   Period Weeks   Status Not Met               Plan - 02/20/16 1429    Rehab Potential Good   PT Frequency 2x / week   PT Duration 4 weeks   PT Treatment/Interventions Functional mobility training;Therapeutic activities;Therapeutic exercise;Balance  training;Neuromuscular re-education;Patient/family education;Manual techniques;Moist Heat;Electrical Stimulation;Cryotherapy;ADLs/Self Care Home Management;Passive range of motion   PT Next Visit Plan Increase L shoulder ROM/ strengthening.  Functional tasks.      PT Home Exercise Plan As prescribed   Consulted and Agree with Plan of Care Patient      Patient will benefit from skilled therapeutic intervention in order to improve the following deficits and impairments:  Decreased activity tolerance, Decreased endurance, Decreased strength, Improper body mechanics, Postural dysfunction, Obesity, Hypomobility, Pain  Visit Diagnosis: Chronic left shoulder pain  Stiffness of left shoulder joint  Muscle weakness (generalized)  Abnormal posture       G-Codes - 02-22-2016 1429    Functional Assessment Tool Used QuickDASH/ joint stiffness/ muscle weakness/ pain   Functional Limitation Carrying, moving and handling objects   Carrying, Moving and Handling Objects Current Status [phone]) At least 20 percent but less than 40 percent impaired, limited or restricted   Carrying, Moving and Handling Objects Goal Status (V5643) At least 1 percent but less than 20 percent impaired, limited or restricted      Problem List Patient Active Problem List   Diagnosis Date Noted  . Diarrhea in adult patient 12/03/2015  . Chronic pain in left shoulder 12/02/2015  . Family history of breast cancer 08/31/2015  . Vitamin D toxicity 05/25/2015  . Loss of balance 05/25/2015  . Medication adverse effect 04/30/2015  . Encounter for preventive health examination 01/31/2015  . Osteopenia determined by x-ray 08/19/2014  . Pain in joint, ankle and foot 07/28/2014  . Lumbago 07/26/2014  . Hypothyroidism due to acquired atrophy of thyroid 04/28/2014  . Depression with anxiety 04/28/2014  . Vitamin B12 deficiency (non anemic) 10/22/2013  . Statin intolerance 02/26/2013  . S/P TKR (total knee replacement) 02/25/2013   . Sacroiliac joint disease 02/01/2011  . Hyperlipidemia with target LDL less than 70 11/01/2010  . Diabetes mellitus type 2 in obese ([place])   . Essential hypertension   . Obesity (BMI 30-39.9)   . Diverticulosis of sigmoid colon     Pura Spice 02/20/2016, 2:30 PM  [facility] [address] [place], [place], [address] Phone: [phone]   Fax:  [phone]  Name: Brenda Floyd MRN: [id number] Date of Birth: August 22, 1940

## 2016-02-26 ENCOUNTER — Encounter: Payer: Medicare Other | Admitting: Physical Therapy

## 2016-02-28 ENCOUNTER — Ambulatory Visit: Payer: Medicare Other | Admitting: Physical Therapy

## 2016-03-04 ENCOUNTER — Ambulatory Visit: Payer: Medicare Other | Admitting: Physical Therapy

## 2016-03-04 DIAGNOSIS — G8929 Other chronic pain: Secondary | ICD-10-CM | POA: Diagnosis not present

## 2016-03-04 DIAGNOSIS — M6281 Muscle weakness (generalized): Secondary | ICD-10-CM

## 2016-03-04 DIAGNOSIS — M25612 Stiffness of left shoulder, not elsewhere classified: Secondary | ICD-10-CM

## 2016-03-04 DIAGNOSIS — M25512 Pain in left shoulder: Secondary | ICD-10-CM | POA: Diagnosis not present

## 2016-03-04 DIAGNOSIS — R293 Abnormal posture: Secondary | ICD-10-CM | POA: Diagnosis not present

## 2016-03-04 NOTE — Therapy (Signed)
[facility] [address] [place], [place], [address] Phone: [phone]   Fax:  [phone]  Physical Therapy Treatment  Patient Details  Name: Brenda Floyd MRN: [id number] Date of Birth: 1940-08-21 Referring Provider: Reche Dixon, PA-C  Encounter Date: 03/04/2016      PT End of Session - 03/04/16 1405    Visit Number 16   Number of Visits 22   Date for PT Re-Evaluation 03/18/16   Authorization - Visit Number 16   Authorization - Number of Visits 24   PT Start Time 8527   PT Stop Time 7824   PT Time Calculation (min) 65 min   Activity Tolerance Patient tolerated treatment well;No increased pain   Behavior During Therapy WFL for tasks assessed/performed      Past Medical History:  Diagnosis Date  . Cancer ([place])    skin ca  . Colon polyps 2012   [facility], followup due 2015  . Diabetes mellitus   . Diverticulosis of sigmoid colon July 2012   by colonoscopy  . Fracture closed, femur, shaft ([place]) remote   s/p screws and rod repair  . Hypertension   . Thyroid disease     Past Surgical History:  Procedure Laterality Date  . ABDOMINAL HYSTERECTOMY    . BREAST CYST ASPIRATION Left    neg  . JOINT REPLACEMENT     bilateral knee, 2011    There were no vitals filed for this visit.      Subjective Assessment - 03/04/16 1256    Subjective Pt. reports she has no pain in L shoulder during rest. Pt. said she had a good weekend and enjoyed the nicer weather.   Limitations House hold activities;Other (comment)   Patient Stated Goals Increase L shoulder ROM/ strengthening/ pain mgmt.     Currently in Pain? No/denies       Therex:  Overhead reaching w/ cones B UE w/ focus on L shoulder ROM. No pain w/ activity  Shake weight R/L - shoulder & elbow @ 90 deg.  Nautilus: lat pull-downs 20x2 30#, standing sh. Abd. 20x 20# Standing diagonal's w/ RTB 20x R/L - pt. Cued to maintain elbow extension and keep B UT down.  L  shoulder rhythmic stabs in supine 3 bouts 30 sec. each  Supine L fwd. Shoulder flexion to 90 deg. W/ 2# dumbbell 10x2  UBE fwd/bckwd 6 min forward/backward  Pt. Is progressing through L shoulder strengthening exercises with increasing L sh. ROM, stability, and reports no increase in pain during tx. Pt. Will benefit from skilled PT to continue strengthening program to progress toward a home-based program in the near future.         PT Long Term Goals - 01/16/16 1444      PT LONG TERM GOAL #1   Title Pt. independent with HEP to increase L shoulder AROM to [facility] as compared to R shoulder to improve pain-free mobility.    Baseline R shoulder AROM WNL.  L shoulder AROM (supine/ sitting):  flexion (145 deg.), abd. (125 deg.), ER (58 deg.), IR (80 deg.).       Time 4   Period Weeks   Status Partially Met     PT LONG TERM GOAL #2   Title Pt. will increase L shoulder/bicep strength to grossly 4/5 MMT to improve lifting/carrying with household chores.     Baseline 3+/5 L shoulder flexion/ abd. (pain).    Time 4   Period Weeks   Status  On-going     PT LONG TERM GOAL #3   Title Pt. will complete QuickDASH and score <20% to improve pain-free mobility with L shoulder/UE tasks.    Time 4   Period Weeks   Status On-going     PT LONG TERM GOAL #4   Title Pt. able to sleep on L side with no increase c/o L shoulder pain.     Baseline pain limited on L shoulder/side at night.    Time 4   Period Weeks   Status Not Met             Patient will benefit from skilled therapeutic intervention in order to improve the following deficits and impairments:     Visit Diagnosis: Chronic left shoulder pain  Stiffness of left shoulder joint  Muscle weakness (generalized)  Abnormal posture     Problem List Patient Active Problem List   Diagnosis Date Noted  . Diarrhea in adult patient 12/03/2015  . Chronic pain in left shoulder 12/02/2015  . Family history of breast cancer 08/31/2015  .  Vitamin D toxicity 05/25/2015  . Loss of balance 05/25/2015  . Medication adverse effect 04/30/2015  . Encounter for preventive health examination 01/31/2015  . Osteopenia determined by x-ray 08/19/2014  . Pain in joint, ankle and foot 07/28/2014  . Lumbago 07/26/2014  . Hypothyroidism due to acquired atrophy of thyroid 04/28/2014  . Depression with anxiety 04/28/2014  . Vitamin B12 deficiency (non anemic) 10/22/2013  . Statin intolerance 02/26/2013  . S/P TKR (total knee replacement) 02/25/2013  . Sacroiliac joint disease 02/01/2011  . Hyperlipidemia with target LDL less than 70 11/01/2010  . Diabetes mellitus type 2 in obese ([place])   . Essential hypertension   . Obesity (BMI 30-39.9)   . Diverticulosis of sigmoid colon     Willodean Rosenthal 03/04/2016, 2:43 PM  [facility] [address] [place], [place], [address] Phone: [phone]   Fax:  [phone]  Name: COSANDRA PLOUFFE MRN: [id number] Date of Birth: [birth year]

## 2016-03-05 ENCOUNTER — Encounter: Payer: Self-pay | Admitting: Internal Medicine

## 2016-03-05 ENCOUNTER — Ambulatory Visit (INDEPENDENT_AMBULATORY_CARE_PROVIDER_SITE_OTHER): Payer: Medicare Other | Admitting: Internal Medicine

## 2016-03-05 VITALS — BP 116/86 | HR 78 | Temp 97.7°F | Resp 16 | Ht 65.0 in | Wt 260.0 lb

## 2016-03-05 DIAGNOSIS — E669 Obesity, unspecified: Secondary | ICD-10-CM | POA: Diagnosis not present

## 2016-03-05 DIAGNOSIS — E785 Hyperlipidemia, unspecified: Secondary | ICD-10-CM | POA: Diagnosis not present

## 2016-03-05 DIAGNOSIS — E559 Vitamin D deficiency, unspecified: Secondary | ICD-10-CM | POA: Diagnosis not present

## 2016-03-05 DIAGNOSIS — E034 Atrophy of thyroid (acquired): Secondary | ICD-10-CM | POA: Diagnosis not present

## 2016-03-05 DIAGNOSIS — T452X1S Poisoning by vitamins, accidental (unintentional), sequela: Secondary | ICD-10-CM | POA: Diagnosis not present

## 2016-03-05 DIAGNOSIS — E1169 Type 2 diabetes mellitus with other specified complication: Secondary | ICD-10-CM | POA: Diagnosis not present

## 2016-03-05 DIAGNOSIS — I10 Essential (primary) hypertension: Secondary | ICD-10-CM

## 2016-03-05 DIAGNOSIS — E538 Deficiency of other specified B group vitamins: Secondary | ICD-10-CM

## 2016-03-05 LAB — COMPREHENSIVE METABOLIC PANEL
ALBUMIN: 4.2 g/dL (ref 3.5–5.2)
ALK PHOS: 65 U/L (ref 39–117)
ALT: 17 U/L (ref 0–35)
AST: 11 U/L (ref 0–37)
BILIRUBIN TOTAL: 0.7 mg/dL (ref 0.2–1.2)
BUN: 20 mg/dL (ref 6–23)
CO2: 27 mEq/L (ref 19–32)
Calcium: 9.2 mg/dL (ref 8.4–10.5)
Chloride: 103 mEq/L (ref 96–112)
Creatinine, Ser: 0.77 mg/dL (ref 0.40–1.20)
GFR: 77.14 mL/min (ref >60.00)
Glucose, Bld: 116 mg/dL — ABNORMAL HIGH (ref 70–99)
Potassium: 4.4 mEq/L (ref 3.5–5.1)
Sodium: 137 mEq/L (ref 135–145)
TOTAL PROTEIN: 6.9 g/dL (ref 6.0–8.3)

## 2016-03-05 LAB — LIPID PANEL
Cholesterol: 279 mg/dL — ABNORMAL HIGH (ref 0–200)
HDL: 52.5 mg/dL (ref >39.00)
NONHDL: 226.7
Total CHOL/HDL Ratio: 5
Triglycerides: 242 mg/dL — ABNORMAL HIGH (ref 0.0–149.0)
VLDL: 48.4 mg/dL — ABNORMAL HIGH (ref 0.0–40.0)

## 2016-03-05 LAB — TSH: TSH: 1.57 u[IU]/mL (ref 0.35–4.50)

## 2016-03-05 LAB — HEMOGLOBIN A1C: HEMOGLOBIN A1C: 5.8 % (ref 4.6–6.5)

## 2016-03-05 LAB — VITAMIN D 25 HYDROXY (VIT D DEFICIENCY, FRACTURES): VITD: 69.75 ng/mL (ref 30.00–100.00)

## 2016-03-05 LAB — LDL CHOLESTEROL, DIRECT: Direct LDL: 181 mg/dL

## 2016-03-05 MED ORDER — OSELTAMIVIR PHOSPHATE 75 MG PO CAPS: 75.0000 mg | ORAL_CAPSULE | Freq: Two times a day (BID) | ORAL | 0 refills | Status: DC

## 2016-03-05 NOTE — Progress Notes (Signed)
Subjective:  Patient ID: Brenda Floyd, female    DOB: 1940-07-14  Age: 76 y.o. MRN: [id number]  CC: The primary encounter diagnosis was Diabetes mellitus type 2 in obese ([place]). Diagnoses of Essential hypertension, Hypothyroidism due to acquired atrophy of thyroid, Poisoning by vitamin D, accidental or unintentional, sequela, Vitamin D deficiency, Obesity (BMI 30-39.9), Hyperlipidemia with target LDL less than 70, and Vitamin B12 deficiency (non anemic) were also pertinent to this visit.  HPIdm Brenda Floyd presents for 3 month follow up on diabetes.  Patient has no complaints today.  Patient is following a low glycemic index diet and taking 22 units of Lantus daily without side effects.  Fasting sugars have been under less than 140 most of the time and post prandials have been under 160 except on rare occasions. Patient is exercising about 3 times per week and intentionally trying to lose weight .  Patient has had an eye exam in the last 12 months and checks feet regularly for signs of infection.  Patient does not walk barefoot outside,  And denies an numbness tingling or burning in feet. Patient is up to date on all recommended vaccinations   Had shoulder injection by todd Mundy,  Left,  Then started PT using pulleys for ROM and strengthening rotator cuff   Refuses flu vaccine , but wants tamiflu just in case. Patient is a retired Therapist, sports  Has gained 8 lbs due to Stress eating  dairrhea stopped since d/c metformin    Lab Results  Component Value Date   HGBA1C 5.8 03/05/2016            Outpatient Medications Prior to Visit  Medication Sig Dispense Refill  . acetaminophen (TYLENOL) 325 MG tablet Take 650 mg by mouth as needed.      Marland Kitchen amoxicillin (AMOXIL) 500 MG capsule TAKE 4 CAPSULES BY MOUTH ONE HOUR BEFORE APPOINTMENT 4 capsule 0  . aspirin 81 MG tablet Take 1 tablet (81 mg total) by mouth daily. 30 tablet 11  . Blood Glucose Monitoring Suppl DEVI Freestyle Libre monitor  1 each 0  . co-enzyme Q-10 30 MG capsule Take 1 capsule (30 mg total) by mouth daily. 90 capsule 3  . cyanocobalamin (,VITAMIN B-12,) 1000 MCG/ML injection 1 ml injected monthly 10 mL 2  . fexofenadine (ALLEGRA) 180 MG tablet Take 180 mg by mouth daily.    Marland Kitchen glucose blood (FREESTYLE LITE) test strip Use to check blood sugars once daily,  Twice if needed   DM 250.00 102 each 3  . INS SYRINGE/NEEDLE 1CC/28G (B-D INSULIN SYRINGE 1CC/28G) 28G X 1/2" 1 ML MISC 1 Syringe by Does not apply route daily after supper. For use with insulin 10 each 0  . Insulin Glargine (LANTUS SOLOSTAR) 100 UNIT/ML Solostar Pen INJECT 22 UNITS UNDER THE SKIN DAILY 15 mL 6  . levothyroxine (SYNTHROID, LEVOTHROID) 75 MCG tablet TAKE 1 TABLET DAILY FOR HYPOTHYROIDISM 90 tablet 1  . lisinopril (PRINIVIL,ZESTRIL) 20 MG tablet TAKE ONE TABLET BY MOUTH ONCE DAILY 90 tablet 1  . magnesium gluconate (MAGONATE) 500 MG tablet Take 500 mg by mouth daily.    . meloxicam (MOBIC) 15 MG tablet TAKE ONE TABLET BY MOUTH ONCE DAILY 90 tablet 1  . montelukast (SINGULAIR) 10 MG tablet Take 1 tablet (10 mg total) by mouth at bedtime. 90 tablet 3  . PARoxetine (PAXIL) 20 MG tablet Take 1 tablet (20 mg total) by mouth daily. 30 tablet 0  . Syringe, Disposable, 1 ML MISC For  use with b12 serum 25 each 2  . Syringe/Needle, Disp, (SYRINGE 3CC/25GX1") 25G X 1" 3 ML MISC Use as directed 50 each 0  . TURMERIC PO Take by mouth daily.    . Cholecalciferol (VITAMIN D3) 5000 UNITS CAPS Take 1 tablet by mouth daily. Reported on 08/29/2015    . insulin regular (HUMULIN R) 100 units/mL injection Inject 0.1 mLs (10 Units total) into the skin 3 (three) times daily before meals. For BS > 200 (Patient not taking: Reported on 01/14/2016) 10 mL 0  . traMADol (ULTRAM) 50 MG tablet TAKE ONE TABLET BY MOUTH 4 TIMES DAILY AS NEEDED FOR KNEE PAIN 120 tablet 5   No facility-administered medications prior to visit.     Review of Systems;  Patient denies headache,  fevers, malaise, unintentional weight loss, skin rash, eye pain, sinus congestion and sinus pain, sore throat, dysphagia,  hemoptysis , cough, dyspnea, wheezing, chest pain, palpitations, orthopnea, edema, abdominal pain, nausea, melena, diarrhea, constipation, flank pain, dysuria, hematuria, urinary  Frequency, nocturia, numbness, tingling, seizures,  Focal weakness, Loss of consciousness,  Tremor, insomnia, depression, anxiety, and suicidal ideation.      Objective:  BP 116/86   Pulse 78   Temp 97.7 F (36.5 C) (Oral)   Resp 16   Ht 5\' 5"  (1.651 m)   Wt 260 lb (117.9 kg)   SpO2 93%   BMI 43.27 kg/m   BP Readings from Last 3 Encounters:  03/05/16 116/86  01/14/16 122/82  12/02/15 130/82    Wt Readings from Last 3 Encounters:  03/05/16 260 lb (117.9 kg)  01/14/16 252 lb (114.3 kg)  12/02/15 245 lb 12 oz (111.5 kg)    General appearance: alert, cooperative and appears stated age Ears: normal TM's and external ear canals both ears Throat: lips, mucosa, and tongue normal; teeth and gums normal Neck: no adenopathy, no carotid bruit, supple, symmetrical, trachea midline and thyroid not enlarged, symmetric, no tenderness/mass/nodules Back: symmetric, no curvature. ROM normal. No CVA tenderness. Lungs: clear to auscultation bilaterally Heart: regular rate and rhythm, S1, S2 normal, no murmur, click, rub or gallop Abdomen: soft, non-tender; bowel sounds normal; no masses,  no organomegaly Pulses: 2+ and symmetric Skin: Skin color, texture, turgor normal. No rashes or lesions Lymph nodes: Cervical, supraclavicular, and axillary nodes normal.  Lab Results  Component Value Date   HGBA1C 5.8 03/05/2016   HGBA1C 5.6 12/02/2015   HGBA1C 5.7 08/29/2015    Lab Results  Component Value Date   CREATININE 0.77 03/05/2016   CREATININE 0.83 12/02/2015   CREATININE 0.86 08/29/2015    Lab Results  Component Value Date   WBC 5.3 08/08/2012   HGB 13.5 08/08/2012   HCT 41.2 08/08/2012    PLT 216.0 08/08/2012   GLUCOSE 116 (H) 03/05/2016   CHOL 279 (H) 03/05/2016   TRIG 242.0 (H) 03/05/2016   HDL 52.50 03/05/2016   LDLDIRECT 181.0 03/05/2016   LDLCALC 167 (H) 05/26/2013   ALT 17 03/05/2016   AST 11 03/05/2016   NA 137 03/05/2016   K 4.4 03/05/2016   CL 103 03/05/2016   CREATININE 0.77 03/05/2016   BUN 20 03/05/2016   CO2 27 03/05/2016   TSH 1.57 03/05/2016   HGBA1C 5.8 03/05/2016   MICROALBUR 0.7 05/09/2015    Mm Screening Breast Tomo Bilateral  Result Date: 10/03/2015 CLINICAL DATA:  Screening. EXAM: 2D DIGITAL SCREENING BILATERAL MAMMOGRAM WITH CAD AND ADJUNCT TOMO COMPARISON:  Previous exam(s). ACR Breast Density Category c: The breast tissue is  heterogeneously dense, which may obscure small masses. FINDINGS: There are no findings suspicious for malignancy. Images were processed with CAD. IMPRESSION: No mammographic evidence of malignancy. A result letter of this screening mammogram will be mailed directly to the patient. RECOMMENDATION: Screening mammogram in one year. (Code:SM-B-01Y) BI-RADS CATEGORY  1: Negative. Electronically Signed   By: Ammie Ferrier M.D.   On: 10/03/2015 09:33    Assessment & Plan:   Problem List Items Addressed This Visit    Diabetes mellitus type 2 in obese ([place]) - Primary     well-controlled on 22 units of Lantus and metformin.   hemoglobin A1c has been consistently at or  less than 6.0 . Patient is up-to-date on eye exams and foot exam is normal today except for pes planus. . Patient has no evidence of  Microalbuminuria.  Patient is tolerating aspirin and red yeast rice  for CAD risk reduction and is already on on ACE/ARB .\  Lab Results  Component Value Date   HGBA1C 5.8 03/05/2016   Lab Results  Component Value Date   MICROALBUR 0.7 05/09/2015                    Relevant Orders   Hemoglobin A1c (Completed)   Lipid panel (Completed)   Essential hypertension    Well controlled on current regimen. Renal  function stable, no changes today.  Lab Results  Component Value Date   CREATININE 0.77 03/05/2016   Lab Results  Component Value Date   NA 137 03/05/2016   K 4.4 03/05/2016   CL 103 03/05/2016   CO2 27 03/05/2016         Relevant Orders   Comprehensive metabolic panel (Completed)   LDL cholesterol, direct (Completed)   Hyperlipidemia with target LDL less than 70    Managed with red yeast rice . Based on current lipid profile, the risk of clinically significant CAD is 22% over the next 10 years, using the Framingham risk calculator. The [employer] of [employer] recommends starting patients aged 58 or higher on moderate intensity statin therapy for LDL between 70-189 and 10 yr risk of CAD > 7.5% ;  and high intensity therapy for anyone with LDL > 190. . Does not want to resume lipitor due to concern about potential side effects .  Taking an aspirin.   Lab Results  Component Value Date   CHOL 279 (H) 03/05/2016   HDL 52.50 03/05/2016   LDLCALC 167 (H) 05/26/2013   LDLDIRECT 181.0 03/05/2016   TRIG 242.0 (H) 03/05/2016   CHOLHDL 5 03/05/2016         Hypothyroidism due to acquired atrophy of thyroid   Relevant Orders   TSH (Completed)   Obesity (BMI 30-39.9)    I have addressed  BMI and recommended wt loss of 10% of body weight over the next 6 months using a low fat, fruit/vegetable based [place]iter[place] diet and regular exercise a minimum of 5 days per week.        Vitamin B12 deficiency (non anemic)    Managed with monthly IM injections. likley due to metformin ,  Will recheck next OV since she has stopped metformin,   Lab Results  Component Value Date   VITAMINB12 396 07/26/2014         Vitamin D toxicity   Relevant Orders   VITAMIN D 25 Hydroxy (Vit-D Deficiency, Fractures) (Completed)    Other Visit Diagnoses    Vitamin D deficiency  Relevant Orders   VITAMIN D 25 Hydroxy (Vit-D Deficiency, Fractures) (Completed)      I have discontinued Ms.  Netzley's traMADol. I am also having her start on oseltamivir. Additionally, I am having her maintain her Vitamin D3, acetaminophen, aspirin, glucose blood, co-enzyme Q-10, TURMERIC PO, Syringe (Disposable), SYRINGE 3CC/25GX1", fexofenadine, magnesium gluconate, Insulin Glargine, amoxicillin, cyanocobalamin, PARoxetine, montelukast, levothyroxine, insulin regular, INS SYRINGE/NEEDLE 1CC/28G, meloxicam, lisinopril, and Blood Glucose Monitoring Suppl.  Meds ordered this encounter  Medications  . oseltamivir (TAMIFLU) 75 MG capsule    Sig: Take 1 capsule (75 mg total) by mouth 2 (two) times daily.    Dispense:  10 capsule    Refill:  0    Medications Discontinued During This Encounter  Medication Reason  . traMADol (ULTRAM) 50 MG tablet Patient Discharge    Follow-up: No Follow-up on file.   Crecencio Mc, MD

## 2016-03-05 NOTE — Progress Notes (Signed)
Pre-visit discussion using our clinic review tool. No additional management support is needed unless otherwise documented below in the visit note.  

## 2016-03-07 NOTE — Assessment & Plan Note (Signed)
well-controlled on 22 units of Lantus and metformin.   hemoglobin A1c has been consistently at or  less than 6.0 . Patient is up-to-date on eye exams and foot exam is normal today except for pes planus. . Patient has no evidence of  Microalbuminuria.  Patient is tolerating aspirin and red yeast rice  for CAD risk reduction and is already on on ACE/ARB .\  Lab Results  Component Value Date   HGBA1C 5.8 03/05/2016   Lab Results  Component Value Date   MICROALBUR 0.7 05/09/2015

## 2016-03-07 NOTE — Assessment & Plan Note (Signed)
Managed with red yeast rice . Based on current lipid profile, the risk of clinically significant CAD is 22% over the next 10 years, using the Framingham risk calculator. The [employer] recommends starting patients aged 78 or higher on moderate intensity statin therapy for LDL between 70-189 and 10 yr risk of CAD > 7.5% ;  and high intensity therapy for anyone with LDL > 190. . Does not want to resume lipitor due to concern about potential side effects .  Taking an aspirin.   Lab Results  Component Value Date   CHOL 279 (H) 03/05/2016   HDL 52.50 03/05/2016   LDLCALC 167 (H) 05/26/2013   LDLDIRECT 181.0 03/05/2016   TRIG 242.0 (H) 03/05/2016   CHOLHDL 5 03/05/2016

## 2016-03-07 NOTE — Assessment & Plan Note (Signed)
Well controlled on current regimen. Renal function stable, no changes today.  Lab Results  Component Value Date   CREATININE 0.77 03/05/2016   Lab Results  Component Value Date   NA 137 03/05/2016   K 4.4 03/05/2016   CL 103 03/05/2016   CO2 27 03/05/2016

## 2016-03-07 NOTE — Assessment & Plan Note (Signed)
I have addressed  BMI and recommended wt loss of 10% of body weight over the next 6 months using a low fat, fruit/vegetable based [place] diet and regular exercise a minimum of 5 days per week.   

## 2016-03-07 NOTE — Assessment & Plan Note (Signed)
Managed with monthly IM injections. likley due to metformin ,  Will recheck next OV since she has stopped metformin,   Lab Results  Component Value Date   VITAMINB12 396 07/26/2014

## 2016-03-08 ENCOUNTER — Encounter: Payer: Self-pay | Admitting: Internal Medicine

## 2016-03-12 ENCOUNTER — Encounter: Payer: Medicare Other | Admitting: Physical Therapy

## 2016-03-19 ENCOUNTER — Ambulatory Visit: Payer: Medicare Other | Attending: Orthopedic Surgery | Admitting: Physical Therapy

## 2016-03-19 DIAGNOSIS — R2689 Other abnormalities of gait and mobility: Secondary | ICD-10-CM | POA: Diagnosis not present

## 2016-03-19 DIAGNOSIS — G8929 Other chronic pain: Secondary | ICD-10-CM | POA: Diagnosis not present

## 2016-03-19 DIAGNOSIS — R293 Abnormal posture: Secondary | ICD-10-CM | POA: Diagnosis not present

## 2016-03-19 DIAGNOSIS — M25612 Stiffness of left shoulder, not elsewhere classified: Secondary | ICD-10-CM

## 2016-03-19 DIAGNOSIS — M6281 Muscle weakness (generalized): Secondary | ICD-10-CM

## 2016-03-19 DIAGNOSIS — M25512 Pain in left shoulder: Secondary | ICD-10-CM | POA: Diagnosis not present

## 2016-03-20 NOTE — Therapy (Signed)
[facility] [address] [place], [place], [address] Phone: [phone]   Fax:  [phone]  Physical Therapy Treatment  Patient Details  Name: Brenda Floyd MRN: [id number] Date of Birth: August 21, 1940 Referring Provider: Reche Dixon, PA-C  Encounter Date: 03/19/2016      PT End of Session - 03/20/16 1035    Visit Number 17   Number of Visits 20   Date for PT Re-Evaluation 04/16/16   Authorization - Visit Number 17   Authorization - Number of Visits 26   PT Start Time 7262   PT Stop Time 0355   PT Time Calculation (min) 58 min   Activity Tolerance Patient tolerated treatment well;No increased pain   Behavior During Therapy WFL for tasks assessed/performed      Past Medical History:  Diagnosis Date  . Cancer ([place])    skin ca  . Colon polyps 2012   [facility], followup due 2015  . Diabetes mellitus   . Diverticulosis of sigmoid colon July 2012   by colonoscopy  . Fracture closed, femur, shaft ([place]) remote   s/p screws and rod repair  . Hypertension   . Thyroid disease     Past Surgical History:  Procedure Laterality Date  . ABDOMINAL HYSTERECTOMY    . BREAST CYST ASPIRATION Left    neg  . JOINT REPLACEMENT     bilateral knee, 2011    There were no vitals filed for this visit.      Subjective Assessment - 03/20/16 1035    Limitations House hold activities;Other (comment)   Patient Stated Goals Increase L shoulder ROM/ strengthening/ pain mgmt.     Currently in Pain? Yes   Pain Score 4    Pain Location Shoulder   Pain Orientation Right;Left                                 PT Education - 03/20/16 1035    Education provided Yes   Education Details See new HEP   Person(s) Educated Patient   Methods Explanation;Demonstration;Handout   Comprehension Verbalized understanding;Returned demonstration             PT Long Term Goals - 01/16/16 1444      PT LONG TERM GOAL #1   Title Pt. independent with HEP to increase L shoulder AROM to [facility] as compared to R shoulder to improve pain-free mobility.    Baseline R shoulder AROM WNL.  L shoulder AROM (supine/ sitting):  flexion (145 deg.), abd. (125 deg.), ER (58 deg.), IR (80 deg.).       Time 4   Period Weeks   Status Partially Met     PT LONG TERM GOAL #2   Title Pt. will increase L shoulder/bicep strength to grossly 4/5 MMT to improve lifting/carrying with household chores.     Baseline 3+/5 L shoulder flexion/ abd. (pain).    Time 4   Period Weeks   Status On-going     PT LONG TERM GOAL #3   Title Pt. will complete QuickDASH and score <20% to improve pain-free mobility with L shoulder/UE tasks.    Time 4   Period Weeks   Status On-going     PT LONG TERM GOAL #4   Title Pt. able to sleep on L side with no increase c/o L shoulder pain.     Baseline pain limited on L shoulder/side at night.  Time 4   Period Weeks   Status Not Met               Plan - March 27, 2016 1036    Rehab Potential Good   PT Frequency 1x / week   PT Duration 4 weeks   PT Treatment/Interventions Functional mobility training;Therapeutic activities;Therapeutic exercise;Balance training;Neuromuscular re-education;Patient/family education;Manual techniques;Moist Heat;Electrical Stimulation;Cryotherapy;ADLs/Self Care Home Management;Passive range of motion      Patient will benefit from skilled therapeutic intervention in order to improve the following deficits and impairments:  Decreased activity tolerance, Decreased endurance, Decreased strength, Improper body mechanics, Postural dysfunction, Obesity, Hypomobility, Pain  Visit Diagnosis: Chronic left shoulder pain  Stiffness of left shoulder joint  Muscle weakness (generalized)  Abnormal posture       G-Codes - 27-Mar-2016 1037    Functional Assessment Tool Used QuickDASH/ joint stiffness/ muscle weakness/ pain   Functional Limitation Mobility: Walking and moving around    Carrying, Moving and Handling Objects Current Status [phone]) At least 20 percent but less than 40 percent impaired, limited or restricted   Carrying, Moving and Handling Objects Goal Status ([id number]) At least 1 percent but less than 20 percent impaired, limited or restricted      Problem List Patient Active Problem List   Diagnosis Date Noted  . Chronic pain in left shoulder 12/02/2015  . Family history of breast cancer 08/31/2015  . Vitamin D toxicity 05/25/2015  . Loss of balance 05/25/2015  . Medication adverse effect 04/30/2015  . Encounter for preventive health examination 01/31/2015  . Osteopenia determined by x-ray 08/19/2014  . Pain in joint, ankle and foot 07/28/2014  . Lumbago 07/26/2014  . Hypothyroidism due to acquired atrophy of thyroid 04/28/2014  . Depression with anxiety 04/28/2014  . Vitamin B12 deficiency (non anemic) 10/22/2013  . Statin intolerance 02/26/2013  . S/P TKR (total knee replacement) 02/25/2013  . Sacroiliac joint disease 02/01/2011  . Hyperlipidemia with target LDL less than 70 11/01/2010  . Diabetes mellitus type 2 in obese ([place])   . Essential hypertension   . Obesity (BMI 30-39.9)   . Diverticulosis of sigmoid colon     Pura Spice 2016-03-27, 10:39 AM  [facility] [address] [place], [place], [address] Phone: [phone]   Fax:  [phone]  Name: TAREN DYMEK MRN: [id number] Date of Birth: 05-20-1940

## 2016-03-24 ENCOUNTER — Other Ambulatory Visit: Payer: Self-pay | Admitting: Internal Medicine

## 2016-03-25 ENCOUNTER — Ambulatory Visit: Payer: Medicare Other | Admitting: Physical Therapy

## 2016-03-25 DIAGNOSIS — M25612 Stiffness of left shoulder, not elsewhere classified: Secondary | ICD-10-CM | POA: Diagnosis not present

## 2016-03-25 DIAGNOSIS — R293 Abnormal posture: Secondary | ICD-10-CM

## 2016-03-25 DIAGNOSIS — R2689 Other abnormalities of gait and mobility: Secondary | ICD-10-CM

## 2016-03-25 DIAGNOSIS — M25512 Pain in left shoulder: Principal | ICD-10-CM

## 2016-03-25 DIAGNOSIS — G8929 Other chronic pain: Secondary | ICD-10-CM | POA: Diagnosis not present

## 2016-03-25 DIAGNOSIS — M6281 Muscle weakness (generalized): Secondary | ICD-10-CM | POA: Diagnosis not present

## 2016-03-26 NOTE — Therapy (Signed)
[facility] [address] [place], [place], [address] Phone: [phone]   Fax:  [phone]  Physical Therapy Treatment  Patient Details  Name: Brenda Floyd MRN: [id number] Date of Birth: 1940-04-14 Referring Provider: Reche Dixon, PA-C  Encounter Date: 03/25/2016      PT End of Session - 03/26/16 0952    Visit Number 18   Number of Visits 20   Date for PT Re-Evaluation 04/16/16   Authorization - Visit Number 18   Authorization - Number of Visits 26   PT Start Time 7209   PT Stop Time 4709   PT Time Calculation (min) 59 min   Activity Tolerance Patient tolerated treatment well;No increased pain   Behavior During Therapy WFL for tasks assessed/performed      Past Medical History:  Diagnosis Date  . Cancer ([place])    skin ca  . Colon polyps 2012   [facility], followup due 2015  . Diabetes mellitus   . Diverticulosis of sigmoid colon July 2012   by colonoscopy  . Fracture closed, femur, shaft ([place]) remote   s/p screws and rod repair  . Hypertension   . Thyroid disease     Past Surgical History:  Procedure Laterality Date  . ABDOMINAL HYSTERECTOMY    . BREAST CYST ASPIRATION Left    neg  . JOINT REPLACEMENT     bilateral knee, 2011    There were no vitals filed for this visit.      Subjective Assessment - 03/26/16 0952    Limitations House hold activities;Other (comment)   Patient Stated Goals Increase L shoulder ROM/ strengthening/ pain mgmt.     Currently in Pain? Yes                                      PT Long Term Goals - 01/16/16 1444      PT LONG TERM GOAL #1   Title Pt. independent with HEP to increase L shoulder AROM to [facility] as compared to R shoulder to improve pain-free mobility.    Baseline R shoulder AROM WNL.  L shoulder AROM (supine/ sitting):  flexion (145 deg.), abd. (125 deg.), ER (58 deg.), IR (80 deg.).       Time 4   Period Weeks   Status Partially Met      PT LONG TERM GOAL #2   Title Pt. will increase L shoulder/bicep strength to grossly 4/5 MMT to improve lifting/carrying with household chores.     Baseline 3+/5 L shoulder flexion/ abd. (pain).    Time 4   Period Weeks   Status On-going     PT LONG TERM GOAL #3   Title Pt. will complete QuickDASH and score <20% to improve pain-free mobility with L shoulder/UE tasks.    Time 4   Period Weeks   Status On-going     PT LONG TERM GOAL #4   Title Pt. able to sleep on L side with no increase c/o L shoulder pain.     Baseline pain limited on L shoulder/side at night.    Time 4   Period Weeks   Status Not Met             Patient will benefit from skilled therapeutic intervention in order to improve the following deficits and impairments:     Visit Diagnosis: Chronic left shoulder pain  Stiffness of left  shoulder joint  Muscle weakness (generalized)  Abnormal posture  Other abnormalities of gait and mobility     Problem List Patient Active Problem List   Diagnosis Date Noted  . Chronic pain in left shoulder 12/02/2015  . Family history of breast cancer 08/31/2015  . Vitamin D toxicity 05/25/2015  . Loss of balance 05/25/2015  . Medication adverse effect 04/30/2015  . Encounter for preventive health examination 01/31/2015  . Osteopenia determined by x-ray 08/19/2014  . Pain in joint, ankle and foot 07/28/2014  . Lumbago 07/26/2014  . Hypothyroidism due to acquired atrophy of thyroid 04/28/2014  . Depression with anxiety 04/28/2014  . Vitamin B12 deficiency (non anemic) 10/22/2013  . Statin intolerance 02/26/2013  . S/P TKR (total knee replacement) 02/25/2013  . Sacroiliac joint disease 02/01/2011  . Hyperlipidemia with target LDL less than 70 11/01/2010  . Diabetes mellitus type 2 in obese ([place])   . Essential hypertension   . Obesity (BMI 30-39.9)   . Diverticulosis of sigmoid colon     Pura Spice 03/26/2016, 9:54 AM   [facility] [address] [place], [place], [address] Phone: [phone]   Fax:  [phone]  Name: Brenda Floyd MRN: [id number] Date of Birth: Dec 29, 1940

## 2016-04-01 ENCOUNTER — Ambulatory Visit: Payer: Medicare Other | Admitting: Physical Therapy

## 2016-04-01 DIAGNOSIS — R293 Abnormal posture: Secondary | ICD-10-CM | POA: Diagnosis not present

## 2016-04-01 DIAGNOSIS — M25612 Stiffness of left shoulder, not elsewhere classified: Secondary | ICD-10-CM

## 2016-04-01 DIAGNOSIS — G8929 Other chronic pain: Secondary | ICD-10-CM

## 2016-04-01 DIAGNOSIS — M25512 Pain in left shoulder: Principal | ICD-10-CM

## 2016-04-01 DIAGNOSIS — R2689 Other abnormalities of gait and mobility: Secondary | ICD-10-CM

## 2016-04-01 DIAGNOSIS — M6281 Muscle weakness (generalized): Secondary | ICD-10-CM | POA: Diagnosis not present

## 2016-04-02 ENCOUNTER — Encounter: Payer: Self-pay | Admitting: Physical Therapy

## 2016-04-02 NOTE — Therapy (Signed)
[place] [facility] [address] [place], [place], [address] Phone: [phone]   Fax:  [phone]  Physical Therapy Treatment  Patient Details  Name: Brenda Floyd MRN: [id number] Date of Birth: 11-07-1940 Referring Provider: Reche Dixon, PA-C  Encounter Date: 04/01/2016      PT End of Session - 04/02/16 1316    Visit Number 19   Number of Visits 20   Date for PT Re-Evaluation 04/16/16   Authorization - Visit Number 19   Authorization - Number of Visits 26   PT Start Time 1448   PT Stop Time 1525   PT Time Calculation (min) 59 min   Activity Tolerance Patient tolerated treatment well;No increased pain   Behavior During Therapy WFL for tasks assessed/performed      Past Medical History:  Diagnosis Date  . Cancer ([place])    skin ca  . Colon polyps 2012   [facility], followup due 2015  . Diabetes mellitus   . Diverticulosis of sigmoid colon July 2012   by colonoscopy  . Fracture closed, femur, shaft ([place]) remote   s/p screws and rod repair  . Hypertension   . Thyroid disease     Past Surgical History:  Procedure Laterality Date  . ABDOMINAL HYSTERECTOMY    . BREAST CYST ASPIRATION Left    neg  . JOINT REPLACEMENT     bilateral knee, 2011    There were no vitals filed for this visit.                                    PT Long Term Goals - 01/16/16 1444      PT LONG TERM GOAL #1   Title Pt. independent with HEP to increase L shoulder AROM to [facility] as compared to R shoulder to improve pain-free mobility.    Baseline R shoulder AROM WNL.  L shoulder AROM (supine/ sitting):  flexion (145 deg.), abd. (125 deg.), ER (58 deg.), IR (80 deg.).       Time 4   Period Weeks   Status Partially Met     PT LONG TERM GOAL #2   Title Pt. will increase L shoulder/bicep strength to grossly 4/5 MMT to improve lifting/carrying with household chores.     Baseline 3+/5 L shoulder flexion/ abd. (pain).    Time 4   Period Weeks   Status On-going     PT LONG TERM GOAL #3   Title Pt. will complete QuickDASH and score <20% to improve pain-free mobility with L shoulder/UE tasks.    Time 4   Period Weeks   Status On-going     PT LONG TERM GOAL #4   Title Pt. able to sleep on L side with no increase c/o L shoulder pain.     Baseline pain limited on L shoulder/side at night.    Time 4   Period Weeks   Status Not Met             Patient will benefit from skilled therapeutic intervention in order to improve the following deficits and impairments:     Visit Diagnosis: Chronic left shoulder pain  Stiffness of left shoulder joint  Muscle weakness (generalized)  Abnormal posture  Other abnormalities of gait and mobility     Problem List Patient Active Problem List   Diagnosis Date Noted  . Chronic pain in left shoulder 12/02/2015  .  Family history of breast cancer 08/31/2015  . Vitamin D toxicity 05/25/2015  . Loss of balance 05/25/2015  . Medication adverse effect 04/30/2015  . Encounter for preventive health examination 01/31/2015  . Osteopenia determined by x-ray 08/19/2014  . Pain in joint, ankle and foot 07/28/2014  . Lumbago 07/26/2014  . Hypothyroidism due to acquired atrophy of thyroid 04/28/2014  . Depression with anxiety 04/28/2014  . Vitamin B12 deficiency (non anemic) 10/22/2013  . Statin intolerance 02/26/2013  . S/P TKR (total knee replacement) 02/25/2013  . Sacroiliac joint disease 02/01/2011  . Hyperlipidemia with target LDL less than 70 11/01/2010  . Diabetes mellitus type 2 in obese ([place])   . Essential hypertension   . Obesity (BMI 30-39.9)   . Diverticulosis of sigmoid colon     Pura Spice 04/02/2016, 1:18 PM  [facility] [address] [place], [place], [address] Phone: [phone]   Fax:  [phone]  Name: Brenda Floyd MRN: [id number] Date of Birth: [birth year]

## 2016-04-06 ENCOUNTER — Ambulatory Visit: Payer: Medicare Other | Admitting: Physical Therapy

## 2016-04-06 DIAGNOSIS — M25512 Pain in left shoulder: Secondary | ICD-10-CM | POA: Diagnosis not present

## 2016-04-06 DIAGNOSIS — G8929 Other chronic pain: Secondary | ICD-10-CM

## 2016-04-06 DIAGNOSIS — R2689 Other abnormalities of gait and mobility: Secondary | ICD-10-CM

## 2016-04-06 DIAGNOSIS — M25612 Stiffness of left shoulder, not elsewhere classified: Secondary | ICD-10-CM

## 2016-04-06 DIAGNOSIS — M6281 Muscle weakness (generalized): Secondary | ICD-10-CM

## 2016-04-06 DIAGNOSIS — R293 Abnormal posture: Secondary | ICD-10-CM | POA: Diagnosis not present

## 2016-04-07 NOTE — Therapy (Signed)
[facility] [address] [place], [place], [address] Phone: [phone]   Fax:  [phone]  Physical Therapy Treatment  Patient Details  Name: Brenda Floyd MRN: [id number] Date of Birth: 1940-05-17 Referring Provider: Reche Dixon, PA-C  Encounter Date: 04/06/2016      PT End of Session - 04/07/16 1630    Visit Number 20   Number of Visits 20   Date for PT Re-Evaluation 04/16/16   Authorization - Visit Number 20   Authorization - Number of Visits 26   PT Start Time 6568   PT Stop Time 1275   PT Time Calculation (min) 57 min   Activity Tolerance Patient tolerated treatment well;No increased pain   Behavior During Therapy WFL for tasks assessed/performed      Past Medical History:  Diagnosis Date  . Cancer ([place])    skin ca  . Colon polyps 2012   [facility], followup due 2015  . Diabetes mellitus   . Diverticulosis of sigmoid colon July 2012   by colonoscopy  . Fracture closed, femur, shaft ([place]) remote   s/p screws and rod repair  . Hypertension   . Thyroid disease     Past Surgical History:  Procedure Laterality Date  . ABDOMINAL HYSTERECTOMY    . BREAST CYST ASPIRATION Left    neg  . JOINT REPLACEMENT     bilateral knee, 2011    There were no vitals filed for this visit.                                    PT Long Term Goals - 01/16/16 1444      PT LONG TERM GOAL #1   Title Pt. independent with HEP to increase L shoulder AROM to [facility] as compared to R shoulder to improve pain-free mobility.    Baseline R shoulder AROM WNL.  L shoulder AROM (supine/ sitting):  flexion (145 deg.), abd. (125 deg.), ER (58 deg.), IR (80 deg.).       Time 4   Period Weeks   Status Partially Met     PT LONG TERM GOAL #2   Title Pt. will increase L shoulder/bicep strength to grossly 4/5 MMT to improve lifting/carrying with household chores.     Baseline 3+/5 L shoulder flexion/ abd. (pain).    Time 4   Period Weeks   Status On-going     PT LONG TERM GOAL #3   Title Pt. will complete QuickDASH and score <20% to improve pain-free mobility with L shoulder/UE tasks.    Time 4   Period Weeks   Status On-going     PT LONG TERM GOAL #4   Title Pt. able to sleep on L side with no increase c/o L shoulder pain.     Baseline pain limited on L shoulder/side at night.    Time 4   Period Weeks   Status Not Met             Patient will benefit from skilled therapeutic intervention in order to improve the following deficits and impairments:     Visit Diagnosis: Chronic left shoulder pain  Stiffness of left shoulder joint  Muscle weakness (generalized)  Abnormal posture  Other abnormalities of gait and mobility     Problem List Patient Active Problem List   Diagnosis Date Noted  . Chronic pain in left shoulder 12/02/2015  .  Family history of breast cancer 08/31/2015  . Vitamin D toxicity 05/25/2015  . Loss of balance 05/25/2015  . Medication adverse effect 04/30/2015  . Encounter for preventive health examination 01/31/2015  . Osteopenia determined by x-ray 08/19/2014  . Pain in joint, ankle and foot 07/28/2014  . Lumbago 07/26/2014  . Hypothyroidism due to acquired atrophy of thyroid 04/28/2014  . Depression with anxiety 04/28/2014  . Vitamin B12 deficiency (non anemic) 10/22/2013  . Statin intolerance 02/26/2013  . S/P TKR (total knee replacement) 02/25/2013  . Sacroiliac joint disease 02/01/2011  . Hyperlipidemia with target LDL less than 70 11/01/2010  . Diabetes mellitus type 2 in obese ([place])   . Essential hypertension   . Obesity (BMI 30-39.9)   . Diverticulosis of sigmoid colon     Pura Spice 04/07/2016, 4:31 PM  [facility] [address] [place], [place], [address] Phone: [phone]   Fax:  [phone]  Name: Brenda Floyd MRN: [id number] Date of Birth: 24-Oct-1940

## 2016-04-13 ENCOUNTER — Ambulatory Visit: Payer: Medicare Other | Attending: Orthopedic Surgery | Admitting: Physical Therapy

## 2016-04-13 DIAGNOSIS — M25612 Stiffness of left shoulder, not elsewhere classified: Secondary | ICD-10-CM

## 2016-04-13 DIAGNOSIS — R293 Abnormal posture: Secondary | ICD-10-CM

## 2016-04-13 DIAGNOSIS — R2689 Other abnormalities of gait and mobility: Secondary | ICD-10-CM | POA: Diagnosis not present

## 2016-04-13 DIAGNOSIS — G8929 Other chronic pain: Secondary | ICD-10-CM

## 2016-04-13 DIAGNOSIS — M25512 Pain in left shoulder: Secondary | ICD-10-CM | POA: Diagnosis not present

## 2016-04-13 DIAGNOSIS — M6281 Muscle weakness (generalized): Secondary | ICD-10-CM
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
## 2016-04-20 ENCOUNTER — Encounter: Payer: Medicare Other | Admitting: Physical Therapy

## 2016-04-27 ENCOUNTER — Ambulatory Visit: Payer: Medicare Other | Admitting: Physical Therapy

## 2016-04-27 DIAGNOSIS — G8929 Other chronic pain: Secondary | ICD-10-CM | POA: Diagnosis not present

## 2016-04-27 DIAGNOSIS — M6281 Muscle weakness (generalized): Secondary | ICD-10-CM

## 2016-04-27 DIAGNOSIS — M25512 Pain in left shoulder: Secondary | ICD-10-CM | POA: Diagnosis not present

## 2016-04-27 DIAGNOSIS — R2689 Other abnormalities of gait and mobility: Secondary | ICD-10-CM | POA: Diagnosis not present

## 2016-04-27 DIAGNOSIS — M25612 Stiffness of left shoulder, not elsewhere classified: Secondary | ICD-10-CM

## 2016-04-27 DIAGNOSIS — R293 Abnormal posture: Secondary | ICD-10-CM

## 2016-04-28 NOTE — Therapy (Signed)
[place] [facility] [address]. [place], [place], [address] Phone: [phone]   Fax:  [phone]  Physical Therapy Treatment  Patient Details  Name: Brenda Floyd MRN: [id number] Date of Birth: 1940-09-19 Referring Provider: Reche Dixon, PA-C  Encounter Date: 04/27/2016    Past Medical History:  Diagnosis Date  . Cancer ([place])    skin ca  . Colon polyps 2012   [facility], followup due 2015  . Diabetes mellitus   . Diverticulosis of sigmoid colon July 2012   by colonoscopy  . Fracture closed, femur, shaft ([place]) remote   s/p screws and rod repair  . Hypertension   . Thyroid disease     Past Surgical History:  Procedure Laterality Date  . ABDOMINAL HYSTERECTOMY    . BREAST CYST ASPIRATION Left    neg  . JOINT REPLACEMENT     bilateral knee, 2011    There were no vitals filed for this visit.                                    PT Long Term Goals - 01/16/16 1444      PT LONG TERM GOAL #1   Title Pt. independent with HEP to increase L shoulder AROM to [facility] as compared to R shoulder to improve pain-free mobility.    Baseline R shoulder AROM WNL.  L shoulder AROM (supine/ sitting):  flexion (145 deg.), abd. (125 deg.), ER (58 deg.), IR (80 deg.).       Time 4   Period Weeks   Status Partially Met     PT LONG TERM GOAL #2   Title Pt. will increase L shoulder/bicep strength to grossly 4/5 MMT to improve lifting/carrying with household chores.     Baseline 3+/5 L shoulder flexion/ abd. (pain).    Time 4   Period Weeks   Status On-going     PT LONG TERM GOAL #3   Title Pt. will complete QuickDASH and score <20% to improve pain-free mobility with L shoulder/UE tasks.    Time 4   Period Weeks   Status On-going     PT LONG TERM GOAL #4   Title Pt. able to sleep on L side with no increase c/o L shoulder pain.     Baseline pain limited on L shoulder/side at night.    Time 4   Period Weeks   Status Not Met             Patient will benefit from skilled therapeutic intervention in order to improve the following deficits and impairments:     Visit Diagnosis: Chronic left shoulder pain  Stiffness of left shoulder joint  Muscle weakness (generalized)  Abnormal posture  Other abnormalities of gait and mobility     Problem List Patient Active Problem List   Diagnosis Date Noted  . Chronic pain in left shoulder 12/02/2015  . Family history of breast cancer 08/31/2015  . Vitamin D toxicity 05/25/2015  . Loss of balance 05/25/2015  . Medication adverse effect 04/30/2015  . Encounter for preventive health examination 01/31/2015  . Osteopenia determined by x-ray 08/19/2014  . Pain in joint, ankle and foot 07/28/2014  . Lumbago 07/26/2014  . Hypothyroidism due to acquired atrophy of thyroid 04/28/2014  . Depression with anxiety 04/28/2014  . Vitamin B12 deficiency (non anemic) 10/22/2013  . Statin intolerance 02/26/2013  . S/P TKR (total knee  replacement) 02/25/2013  . Sacroiliac joint disease 02/01/2011  . Hyperlipidemia with target LDL less than 70 11/01/2010  . Diabetes mellitus type 2 in obese ([place])   . Essential hypertension   . Obesity (BMI 30-39.9)   . Diverticulosis of sigmoid colon     Pura Spice 04/28/2016, 4:55 PM  [place] [facility] [address] [place], [place], [address] Phone: [phone]   Fax:  [phone]  Name: KACELYN ROWZEE MRN: [id number] Date of Birth: 06/18/1940

## 2016-05-06 ENCOUNTER — Ambulatory Visit: Payer: Medicare Other | Admitting: Physical Therapy

## 2016-05-06 DIAGNOSIS — R293 Abnormal posture: Secondary | ICD-10-CM | POA: Diagnosis not present

## 2016-05-06 DIAGNOSIS — M6281 Muscle weakness (generalized): Secondary | ICD-10-CM

## 2016-05-06 DIAGNOSIS — G8929 Other chronic pain: Secondary | ICD-10-CM

## 2016-05-06 DIAGNOSIS — M25512 Pain in left shoulder: Secondary | ICD-10-CM | POA: Diagnosis not present

## 2016-05-06 DIAGNOSIS — R2689 Other abnormalities of gait and mobility: Secondary | ICD-10-CM | POA: Diagnosis not present

## 2016-05-06 DIAGNOSIS — M25612 Stiffness of left shoulder, not elsewhere classified: Secondary | ICD-10-CM

## 2016-05-07 ENCOUNTER — Encounter: Payer: Self-pay | Admitting: Physical Therapy

## 2016-05-07 NOTE — Therapy (Signed)
[place] [facility] [address] [place], [place], [address] Phone: [phone]   Fax:  [phone]  Physical Therapy Treatment  Patient Details  Name: Brenda Floyd MRN: [id number] Date of Birth: 1940/10/18 Referring Provider: Reche Dixon, PA-C  Encounter Date: 05/06/2016    Past Medical History:  Diagnosis Date  . Cancer ([place])    skin ca  . Colon polyps 2012   [facility], followup due 2015  . Diabetes mellitus   . Diverticulosis of sigmoid colon July 2012   by colonoscopy  . Fracture closed, femur, shaft ([place]) remote   s/p screws and rod repair  . Hypertension   . Thyroid disease     Past Surgical History:  Procedure Laterality Date  . ABDOMINAL HYSTERECTOMY    . BREAST CYST ASPIRATION Left    neg  . JOINT REPLACEMENT     bilateral knee, 2011    There were no vitals filed for this visit.                                    PT Long Term Goals - 01/16/16 1444      PT LONG TERM GOAL #1   Title Pt. independent with HEP to increase L shoulder AROM to [facility] as compared to R shoulder to improve pain-free mobility.    Baseline R shoulder AROM WNL.  L shoulder AROM (supine/ sitting):  flexion (145 deg.), abd. (125 deg.), ER (58 deg.), IR (80 deg.).       Time 4   Period Weeks   Status Partially Met     PT LONG TERM GOAL #2   Title Pt. will increase L shoulder/bicep strength to grossly 4/5 MMT to improve lifting/carrying with household chores.     Baseline 3+/5 L shoulder flexion/ abd. (pain).    Time 4   Period Weeks   Status On-going     PT LONG TERM GOAL #3   Title Pt. will complete QuickDASH and score <20% to improve pain-free mobility with L shoulder/UE tasks.    Time 4   Period Weeks   Status On-going     PT LONG TERM GOAL #4   Title Pt. able to sleep on L side with no increase c/o L shoulder pain.     Baseline pain limited on L shoulder/side at night.    Time 4   Period Weeks   Status Not Met             Patient will benefit from skilled therapeutic intervention in order to improve the following deficits and impairments:     Visit Diagnosis: Chronic left shoulder pain  Stiffness of left shoulder joint  Muscle weakness (generalized)  Abnormal posture     Problem List Patient Active Problem List   Diagnosis Date Noted  . Chronic pain in left shoulder 12/02/2015  . Family history of breast cancer 08/31/2015  . Vitamin D toxicity 05/25/2015  . Loss of balance 05/25/2015  . Medication adverse effect 04/30/2015  . Encounter for preventive health examination 01/31/2015  . Osteopenia determined by x-ray 08/19/2014  . Pain in joint, ankle and foot 07/28/2014  . Lumbago 07/26/2014  . Hypothyroidism due to acquired atrophy of thyroid 04/28/2014  . Depression with anxiety 04/28/2014  . Vitamin B12 deficiency (non anemic) 10/22/2013  . Statin intolerance 02/26/2013  . S/P TKR (total knee replacement) 02/25/2013  . Sacroiliac joint disease  02/01/2011  . Hyperlipidemia with target LDL less than 70 11/01/2010  . Diabetes mellitus type 2 in obese ([place])   . Essential hypertension   . Obesity (BMI 30-39.9)   . Diverticulosis of sigmoid colon     Pura Spice 05/07/2016, 4:34 PM  [place] [facility] [address] [place], [place], [address] Phone: [phone]   Fax:  [phone]  Name: Brenda Floyd MRN: [id number] Date of Birth: 20-Jan-1941

## 2016-05-08 ENCOUNTER — Other Ambulatory Visit: Payer: Self-pay | Admitting: Internal Medicine

## 2016-05-31 ENCOUNTER — Other Ambulatory Visit: Payer: Self-pay | Admitting: Internal Medicine

## 2016-06-11 ENCOUNTER — Encounter: Payer: Self-pay | Admitting: Internal Medicine

## 2016-06-11 ENCOUNTER — Ambulatory Visit (INDEPENDENT_AMBULATORY_CARE_PROVIDER_SITE_OTHER): Payer: Medicare Other | Admitting: Internal Medicine

## 2016-06-11 VITALS — BP 108/72 | HR 83 | Temp 98.0°F | Resp 17 | Ht 65.0 in | Wt 260.4 lb

## 2016-06-11 DIAGNOSIS — E785 Hyperlipidemia, unspecified: Secondary | ICD-10-CM | POA: Diagnosis not present

## 2016-06-11 DIAGNOSIS — T452X1D Poisoning by vitamins, accidental (unintentional), subsequent encounter: Secondary | ICD-10-CM

## 2016-06-11 DIAGNOSIS — E1169 Type 2 diabetes mellitus with other specified complication: Secondary | ICD-10-CM | POA: Diagnosis not present

## 2016-06-11 DIAGNOSIS — E669 Obesity, unspecified: Secondary | ICD-10-CM

## 2016-06-11 DIAGNOSIS — E559 Vitamin D deficiency, unspecified: Secondary | ICD-10-CM

## 2016-06-11 DIAGNOSIS — T452X1S Poisoning by vitamins, accidental (unintentional), sequela: Secondary | ICD-10-CM | POA: Diagnosis not present

## 2016-06-11 LAB — LIPID PANEL
CHOL/HDL RATIO: 5
CHOLESTEROL: 269 mg/dL — AB (ref 0–200)
HDL: 58.3 mg/dL (ref >39.00)
NonHDL: 210.76
Triglycerides: 284 mg/dL — ABNORMAL HIGH (ref 0.0–149.0)
VLDL: 56.8 mg/dL — AB (ref 0.0–40.0)

## 2016-06-11 LAB — COMPREHENSIVE METABOLIC PANEL
ALBUMIN: 4.2 g/dL (ref 3.5–5.2)
ALK PHOS: 64 U/L (ref 39–117)
ALT: 41 U/L — ABNORMAL HIGH (ref 0–35)
AST: 31 U/L (ref 0–37)
BUN: 15 mg/dL (ref 6–23)
CHLORIDE: 102 meq/L (ref 96–112)
CO2: 29 mEq/L (ref 19–32)
Calcium: 10 mg/dL (ref 8.4–10.5)
Creatinine, Ser: 0.86 mg/dL (ref 0.40–1.20)
GFR: 67.85 mL/min (ref >60.00)
Glucose, Bld: 106 mg/dL — ABNORMAL HIGH (ref 70–99)
POTASSIUM: 4.5 meq/L (ref 3.5–5.1)
SODIUM: 138 meq/L (ref 135–145)
TOTAL PROTEIN: 7.1 g/dL (ref 6.0–8.3)
Total Bilirubin: 0.6 mg/dL (ref 0.2–1.2)

## 2016-06-11 LAB — VITAMIN D 25 HYDROXY (VIT D DEFICIENCY, FRACTURES): VITD: 81.02 ng/mL (ref 30.00–100.00)

## 2016-06-11 LAB — POCT GLYCOSYLATED HEMOGLOBIN (HGB A1C): Hemoglobin A1C: 5.9

## 2016-06-11 LAB — LDL CHOLESTEROL, DIRECT: Direct LDL: 164 mg/dL

## 2016-06-11 NOTE — Patient Instructions (Signed)
No changes to medications today  I Want you to lose 15 lbs this summer!!  See you in 3 months

## 2016-06-11 NOTE — Progress Notes (Signed)
Subjective:  Patient ID: Brenda Floyd, female    DOB: Jun 11, 1940  Age: 76 y.o. MRN: [id number]  CC: The primary encounter diagnosis was Hyperlipidemia associated with type 2 diabetes mellitus ([place]). Diagnoses of Diabetes mellitus type 2 in obese ([place]), Vitamin D toxicity, accidental or unintentional, subsequent encounter, Vitamin D deficiency, Poisoning by vitamin D, accidental or unintentional, sequela, Obesity (BMI 30-39.9), and Hyperlipidemia with target LDL less than 70 were also pertinent to this visit.  HPI Brenda Floyd presents for follow up  on type 2 diabetes   She has received a cortisone  injection left sholuder  Followed by PT .  No surgery planned.  Has limited ROM on left, is able to flex shoulder to 120 degrees,   BS 117 fasting  No lows.  Using 24 units of Lantus, daily,  But  Not following diet. Weight gain discussed.   Still waiting for a new glucometer with skin sensor  from [employer] from [place].    Lab Results  Component Value Date   HGBA1C 5.9 06/11/2016    Taking vitamin D OTC  Once a week 5000 Ius.      Outpatient Medications Prior to Visit  Medication Sig Dispense Refill  . acetaminophen (TYLENOL) 325 MG tablet Take 650 mg by mouth as needed.      Marland Kitchen aspirin 81 MG tablet Take 1 tablet (81 mg total) by mouth daily. 30 tablet 11  . Blood Glucose Monitoring Suppl DEVI Freestyle Libre monitor 1 each 0  . Cholecalciferol (VITAMIN D3) 5000 UNITS CAPS Take 1 tablet by mouth daily. Reported on 08/29/2015    . co-enzyme Q-10 30 MG capsule Take 1 capsule (30 mg total) by mouth daily. 90 capsule 3  . cyanocobalamin (,VITAMIN B-12,) 1000 MCG/ML injection 1 ml injected monthly 10 mL 2  . fexofenadine (ALLEGRA) 180 MG tablet Take 180 mg by mouth daily.    Marland Kitchen glucose blood (FREESTYLE LITE) test strip Use to check blood sugars once daily,  Twice if needed   DM 250.00 102 each 3  . INS SYRINGE/NEEDLE 1CC/28G (B-D INSULIN SYRINGE 1CC/28G) 28G X 1/2" 1 ML MISC 1  Syringe by Does not apply route daily after supper. For use with insulin 10 each 0  . Insulin Glargine (LANTUS SOLOSTAR) 100 UNIT/ML Solostar Pen INJECT 22 UNITS UNDER THE SKIN DAILY (Patient taking differently: 24 Units. INJECT 22 UNITS UNDER THE SKIN DAILY) 15 mL 6  . levothyroxine (SYNTHROID, LEVOTHROID) 75 MCG tablet TAKE 1 TABLET DAILY FOR HYPOTHYROIDISM 90 tablet 0  . lisinopril (PRINIVIL,ZESTRIL) 20 MG tablet TAKE ONE TABLET BY MOUTH ONCE DAILY 90 tablet 1  . magnesium gluconate (MAGONATE) 500 MG tablet Take 500 mg by mouth daily.    Marland Kitchen PARoxetine (PAXIL) 20 MG tablet TAKE 1 TABLET DAILY 90 tablet 2  . Syringe, Disposable, 1 ML MISC For use with b12 serum 25 each 2  . Syringe/Needle, Disp, (SYRINGE 3CC/25GX1") 25G X 1" 3 ML MISC Use as directed 50 each 0  . TURMERIC PO Take by mouth daily.    Marland Kitchen amoxicillin (AMOXIL) 500 MG capsule TAKE 4 CAPSULES BY MOUTH ONE HOUR BEFORE APPOINTMENT (Patient not taking: Reported on 06/11/2016) 4 capsule 0  . insulin regular (HUMULIN R) 100 units/mL injection Inject 0.1 mLs (10 Units total) into the skin 3 (three) times daily before meals. For BS > 200 (Patient not taking: Reported on 01/14/2016) 10 mL 0  . meloxicam (MOBIC) 15 MG tablet TAKE ONE TABLET BY MOUTH  ONCE DAILY (Patient not taking: Reported on 06/11/2016) 90 tablet 1  . montelukast (SINGULAIR) 10 MG tablet Take 1 tablet (10 mg total) by mouth at bedtime. (Patient not taking: Reported on 06/11/2016) 90 tablet 3  . oseltamivir (TAMIFLU) 75 MG capsule Take 1 capsule (75 mg total) by mouth 2 (two) times daily. (Patient not taking: Reported on 06/11/2016) 10 capsule 0   No facility-administered medications prior to visit.     Review of Systems;  Patient denies headache, fevers, malaise, unintentional weight loss, skin rash, eye pain, sinus congestion and sinus pain, sore throat, dysphagia,  hemoptysis , cough, dyspnea, wheezing, chest pain, palpitations, orthopnea, edema, abdominal pain, nausea, melena,  diarrhea, constipation, flank pain, dysuria, hematuria, urinary  Frequency, nocturia, numbness, tingling, seizures,  Focal weakness, Loss of consciousness,  Tremor, insomnia, depression, anxiety, and suicidal ideation.      Objective:  BP 108/72 (BP Location: Left Arm, Patient Position: Sitting, Cuff Size: Large)   Pulse 83   Temp 98 F (36.7 C) (Oral)   Resp 17   Ht 5\' 5"  (1.651 m)   Wt 260 lb 6.4 oz (118.1 kg)   SpO2 91%   BMI 43.33 kg/m   BP Readings from Last 3 Encounters:  06/11/16 108/72  03/05/16 116/86  01/14/16 122/82    Wt Readings from Last 3 Encounters:  06/11/16 260 lb 6.4 oz (118.1 kg)  03/05/16 260 lb (117.9 kg)  01/14/16 252 lb (114.3 kg)    General appearance: alert, cooperative and appears stated age Ears: normal TM's and external ear canals both ears Throat: lips, mucosa, and tongue normal; teeth and gums normal Neck: no adenopathy, no carotid bruit, supple, symmetrical, trachea midline and thyroid not enlarged, symmetric, no tenderness/mass/nodules Back: symmetric, no curvature. ROM normal. No CVA tenderness. Lungs: clear to auscultation bilaterally Heart: regular rate and rhythm, S1, S2 normal, no murmur, click, rub or gallop Abdomen: soft, non-tender; bowel sounds normal; no masses,  no organomegaly Pulses: 2+ and symmetric Skin: Skin color, texture, turgor normal. No rashes or lesions Lymph nodes: Cervical, supraclavicular, and axillary nodes normal.  Lab Results  Component Value Date   HGBA1C 5.9 06/11/2016   HGBA1C 5.8 03/05/2016   HGBA1C 5.6 12/02/2015    Lab Results  Component Value Date   CREATININE 0.86 06/11/2016   CREATININE 0.77 03/05/2016   CREATININE 0.83 12/02/2015    Lab Results  Component Value Date   WBC 5.3 08/08/2012   HGB 13.5 08/08/2012   HCT 41.2 08/08/2012   PLT 216.0 08/08/2012   GLUCOSE 106 (H) 06/11/2016   CHOL 269 (H) 06/11/2016   TRIG 284.0 (H) 06/11/2016   HDL 58.30 06/11/2016   LDLDIRECT 164.0  06/11/2016   LDLCALC 167 (H) 05/26/2013   ALT 41 (H) 06/11/2016   AST 31 06/11/2016   NA 138 06/11/2016   K 4.5 06/11/2016   CL 102 06/11/2016   CREATININE 0.86 06/11/2016   BUN 15 06/11/2016   CO2 29 06/11/2016   TSH 1.57 03/05/2016   HGBA1C 5.9 06/11/2016   MICROALBUR 0.7 05/09/2015    Mm Screening Breast Tomo Bilateral  Result Date: 10/03/2015 CLINICAL DATA:  Screening. EXAM: 2D DIGITAL SCREENING BILATERAL MAMMOGRAM WITH CAD AND ADJUNCT TOMO COMPARISON:  Previous exam(s). ACR Breast Density Category c: The breast tissue is heterogeneously dense, which may obscure small masses. FINDINGS: There are no findings suspicious for malignancy. Images were processed with CAD. IMPRESSION: No mammographic evidence of malignancy. A result letter of this screening mammogram will be mailed  directly to the patient. RECOMMENDATION: Screening mammogram in one year. (Code:SM-B-01Y) BI-RADS CATEGORY  1: Negative. Electronically Signed   By: Ammie Ferrier M.D.   On: 10/03/2015 09:33    Assessment & Plan:   Problem List Items Addressed This Visit    Diabetes mellitus type 2 in obese ([place])     well-controlled on 24 units of Lantus and metformin.   hemoglobin A1c has been consistently at or  less than 6.0 . Patient is up-to-date on eye exams and foot exam is normal today except for pes planus. . Patient has no evidence of  Microalbuminuria.  Patient is tolerating aspirin and red yeast rice  for CAD risk reduction and is already on on ACE/ARB .\  Lab Results  Component Value Date   HGBA1C 5.9 06/11/2016   Lab Results  Component Value Date   MICROALBUR 0.7 05/09/2015                    Relevant Orders   POCT HgB A1C (Completed)   Comprehensive metabolic panel (Completed)   Hyperlipidemia with target LDL less than 70    Managed with red yeast rice . Based on current lipid profile, the risk of clinically significant CAD is 22% over the next 10 years, using the Framingham risk  calculator. The [employer] of Cardiology recommends starting patients aged 72 or higher on moderate intensity statin therapy for LDL between 70-189 and 10 yr risk of CAD > 7.5% ;  and high intensity therapy for anyone with LDL > 190. . Does not want to resume lipitor due to concern about potential side effects .  Taking an aspirin.   Lab Results  Component Value Date   CHOL 269 (H) 06/11/2016   HDL 58.30 06/11/2016   LDLCALC 167 (H) 05/26/2013   LDLDIRECT 164.0 06/11/2016   TRIG 284.0 (H) 06/11/2016   CHOLHDL 5 06/11/2016         Obesity (BMI 30-39.9)    I have addressed  BMI and recommended a low glycemic index diet utilizing smaller more frequent meals to increase metabolism.  I have also recommended that patient start exercising with a goal of 30 minutes of aerobic exercise a minimum of 5 days per week.        Vitamin D toxicity    She has resumed a weekly dose of 5000 Ius  With no repeat supratherpeutif level.  Currently her level is 56       Other Visit Diagnoses    Hyperlipidemia associated with type 2 diabetes mellitus ([place])    -  Primary   Relevant Orders   Lipid panel (Completed)   LDL cholesterol, direct (Completed)   Vitamin D toxicity, accidental or unintentional, subsequent encounter       Vitamin D deficiency       Relevant Orders   VITAMIN D 25 Hydroxy (Vit-D Deficiency, Fractures) (Completed)     A total of 25 minutes of face to face time was spent with patient more than half of which was spent in counselling about the above mentioned conditions  and coordination of care   I have discontinued Ms. Lazarus's oseltamivir. I am also having her maintain her Vitamin D3, acetaminophen, aspirin, glucose blood, co-enzyme Q-10, TURMERIC PO, Syringe (Disposable), SYRINGE 3CC/25GX1", fexofenadine, magnesium gluconate, Insulin Glargine, amoxicillin, cyanocobalamin, montelukast, insulin regular, INS SYRINGE/NEEDLE 1CC/28G, meloxicam, lisinopril, Blood Glucose Monitoring  Suppl, PARoxetine, and levothyroxine.  No orders of the defined types were placed in this encounter.  Medications Discontinued During This Encounter  Medication Reason  . oseltamivir (TAMIFLU) 75 MG capsule Patient has not taken in last 30 days    Follow-up: No Follow-up on file.   Crecencio Mc, MD

## 2016-06-11 NOTE — Progress Notes (Signed)
Pre visit review using our clinic review tool, if applicable. No additional management support is needed unless otherwise documented below in the visit note. 

## 2016-06-13 NOTE — Assessment & Plan Note (Signed)
Managed with red yeast rice . Based on current lipid profile, the risk of clinically significant CAD is 22% over the next 10 years, using the Framingham risk calculator. The [employer] recommends starting patients aged 78 or higher on moderate intensity statin therapy for LDL between 70-189 and 10 yr risk of CAD > 7.5% ;  and high intensity therapy for anyone with LDL > 190. . Does not want to resume lipitor due to concern about potential side effects .  Taking an aspirin.   Lab Results  Component Value Date   CHOL 269 (H) 06/11/2016   HDL 58.30 06/11/2016   LDLCALC 167 (H) 05/26/2013   LDLDIRECT 164.0 06/11/2016   TRIG 284.0 (H) 06/11/2016   CHOLHDL 5 06/11/2016

## 2016-06-13 NOTE — Assessment & Plan Note (Signed)
well-controlled on 24 units of Lantus and metformin.   hemoglobin A1c has been consistently at or  less than 6.0 . Patient is up-to-date on eye exams and foot exam is normal today except for pes planus. . Patient has no evidence of  Microalbuminuria.  Patient is tolerating aspirin and red yeast rice  for CAD risk reduction and is already on on ACE/ARB .\  Lab Results  Component Value Date   HGBA1C 5.9 06/11/2016   Lab Results  Component Value Date   MICROALBUR 0.7 05/09/2015

## 2016-06-13 NOTE — Assessment & Plan Note (Signed)
I have addressed  BMI and recommended a low glycemic index diet utilizing smaller more frequent meals to increase metabolism.  I have also recommended that patient start exercising with a goal of 30 minutes of aerobic exercise a minimum of 5 days per week.  

## 2016-06-13 NOTE — Assessment & Plan Note (Addendum)
She has resumed a weekly dose of 5000 Ius  With no repeat supratherpeutif level.  Currently her level is 81

## 2016-06-14 ENCOUNTER — Other Ambulatory Visit: Payer: Self-pay | Admitting: Internal Medicine

## 2016-06-14 ENCOUNTER — Encounter: Payer: Self-pay | Admitting: Internal Medicine

## 2016-06-14 DIAGNOSIS — R7401 Elevation of levels of liver transaminase levels: Secondary | ICD-10-CM

## 2016-06-14 DIAGNOSIS — R740 Nonspecific elevation of levels of transaminase and lactic acid dehydrogenase [LDH]: Principal | ICD-10-CM

## 2016-06-15 ENCOUNTER — Other Ambulatory Visit: Payer: Self-pay

## 2016-06-15 MED ORDER — INSULIN GLARGINE 100 UNIT/ML SOLOSTAR PEN: PEN_INJECTOR | SUBCUTANEOUS | 6 refills | Status: DC

## 2016-07-11 ENCOUNTER — Other Ambulatory Visit: Payer: Self-pay | Admitting: Internal Medicine

## 2016-08-29 ENCOUNTER — Other Ambulatory Visit: Payer: Self-pay | Admitting: Internal Medicine

## 2016-09-03 ENCOUNTER — Telehealth: Payer: Self-pay | Admitting: Internal Medicine

## 2016-09-03 NOTE — Telephone Encounter (Signed)
Tammy from [employer] called and stated that she received a script for a [employer] for pt but there was no dx. Please advise, thank you!  [phone] opt 4

## 2016-09-03 NOTE — Telephone Encounter (Signed)
refaxed with dx code

## 2016-09-15 ENCOUNTER — Other Ambulatory Visit: Payer: Self-pay | Admitting: Internal Medicine

## 2016-09-15 DIAGNOSIS — E538 Deficiency of other specified B group vitamins: Secondary | ICD-10-CM

## 2016-09-17 ENCOUNTER — Ambulatory Visit (INDEPENDENT_AMBULATORY_CARE_PROVIDER_SITE_OTHER): Payer: Medicare Other

## 2016-09-17 ENCOUNTER — Encounter: Payer: Self-pay | Admitting: Internal Medicine

## 2016-09-17 ENCOUNTER — Ambulatory Visit (INDEPENDENT_AMBULATORY_CARE_PROVIDER_SITE_OTHER): Payer: Medicare Other | Admitting: Internal Medicine

## 2016-09-17 VITALS — BP 104/68 | HR 92 | Temp 98.0°F | Resp 17 | Ht 65.0 in | Wt 260.4 lb

## 2016-09-17 DIAGNOSIS — G8929 Other chronic pain: Secondary | ICD-10-CM

## 2016-09-17 DIAGNOSIS — M25511 Pain in right shoulder: Secondary | ICD-10-CM

## 2016-09-17 DIAGNOSIS — E669 Obesity, unspecified: Secondary | ICD-10-CM

## 2016-09-17 DIAGNOSIS — E1169 Type 2 diabetes mellitus with other specified complication: Secondary | ICD-10-CM

## 2016-09-17 DIAGNOSIS — R748 Abnormal levels of other serum enzymes: Secondary | ICD-10-CM

## 2016-09-17 DIAGNOSIS — F418 Other specified anxiety disorders: Secondary | ICD-10-CM | POA: Diagnosis not present

## 2016-09-17 LAB — COMPREHENSIVE METABOLIC PANEL
ALBUMIN: 4.3 g/dL (ref 3.5–5.2)
ALT: 24 U/L (ref 0–35)
AST: 19 U/L (ref 0–37)
Alkaline Phosphatase: 68 U/L (ref 39–117)
BILIRUBIN TOTAL: 0.6 mg/dL (ref 0.2–1.2)
BUN: 19 mg/dL (ref 6–23)
CALCIUM: 9.6 mg/dL (ref 8.4–10.5)
CO2: 28 meq/L (ref 19–32)
CREATININE: 0.96 mg/dL (ref 0.40–1.20)
Chloride: 102 mEq/L (ref 96–112)
GFR: 59.72 mL/min — ABNORMAL LOW (ref >60.00)
Glucose, Bld: 106 mg/dL — ABNORMAL HIGH (ref 70–99)
Potassium: 4.3 mEq/L (ref 3.5–5.1)
Sodium: 137 mEq/L (ref 135–145)
Total Protein: 7.1 g/dL (ref 6.0–8.3)

## 2016-09-17 MED ORDER — DICLOFENAC SODIUM 1 % TD GEL: 2.0000 g | Freq: Four times a day (QID) | TRANSDERMAL | 3 refills | Status: DC

## 2016-09-17 NOTE — Progress Notes (Signed)
Subjective:  Patient ID: Brenda Floyd, female    DOB: 27-Jul-1940  Age: 76 y.o. MRN: [id number]  CC: The primary encounter diagnosis was Chronic right shoulder pain. Diagnoses of Elevated liver enzymes, Diabetes mellitus type 2 in obese [facility]), Depression with anxiety, and Obesity (BMI 30-39.9) were also pertinent to this visit.  HPI Brenda Floyd presents for 3 month follow up on diabetes and morbid obesity .  Patient has no complaints today.  Patient is following a low glycemic index diet and taking all prescribed medications regularly without side effects.  Fasting sugars have been under less than 140 most of the time and post prandials have been under 160 except on rare occasions. Patient is exercising about 3 times per week and intentionally trying to lose weight .  Patient has had an eye exam in the last 12 months and checks feet regularly for signs of infection.  Patient does not walk barefoot outside,  And denies an numbness tingling or burning in feet. Patient is up to date on all recommended vaccinations Using the free style libre ; her out of pocket cost was $260 cost $5 for the 10 day sensors . sugars above 160 and her 7 day average has been 97 to 103   Not sleeping well; takes her hours to fall asleep . Both shoulders hurting,  Had injection in left shoulder which helped for a while. Fell onto right shoulder in June , has persistent pain in all motions.  Discussed referral to Dr Roland Rack  Tubular adenoma 2017  cataracts   Next app fall 2018   Outpatient Medications Prior to Visit  Medication Sig Dispense Refill  . aspirin 81 MG tablet Take 1 tablet (81 mg total) by mouth daily. 30 tablet 11  . Blood Glucose Monitoring Suppl DEVI Freestyle Libre monitor 1 each 0  . co-enzyme Q-10 30 MG capsule Take 1 capsule (30 mg total) by mouth daily. 90 capsule 3  . cyanocobalamin (,VITAMIN B-12,) 1000 MCG/ML injection INJECT 1 ML MONTHLY 10 mL 2  . fexofenadine (ALLEGRA) 180 MG tablet  Take 180 mg by mouth daily.    Marland Kitchen glucose blood (FREESTYLE LITE) test strip Use to check blood sugars once daily,  Twice if needed   DM 250.00 102 each 3  . INS SYRINGE/NEEDLE 1CC/28G (B-D INSULIN SYRINGE 1CC/28G) 28G X 1/2" 1 ML MISC 1 Syringe by Does not apply route daily after supper. For use with insulin 10 each 0  . Insulin Glargine (LANTUS SOLOSTAR) 100 UNIT/ML Solostar Pen INJECT 22 UNITS UNDER THE SKIN DAILY 15 mL 6  . levothyroxine (SYNTHROID, LEVOTHROID) 75 MCG tablet TAKE 1 TABLET DAILY FOR HYPOTHYROIDISM 90 tablet 0  . lisinopril (PRINIVIL,ZESTRIL) 20 MG tablet TAKE ONE TABLET BY MOUTH ONCE DAILY 90 tablet 1  . magnesium gluconate (MAGONATE) 500 MG tablet Take 500 mg by mouth daily.    Marland Kitchen PARoxetine (PAXIL) 20 MG tablet TAKE 1 TABLET DAILY 90 tablet 2  . Syringe, Disposable, 1 ML MISC For use with b12 serum 25 each 2  . Syringe/Needle, Disp, (SYRINGE 3CC/25GX1") 25G X 1" 3 ML MISC Use as directed 50 each 0  . TURMERIC PO Take by mouth daily.    Marland Kitchen amoxicillin (AMOXIL) 500 MG capsule TAKE 4 CAPSULES BY MOUTH ONE HOUR BEFORE APPOINTMENT (Patient not taking: Reported on 09/17/2016) 4 capsule 0  . insulin regular (HUMULIN R) 100 units/mL injection Inject 0.1 mLs (10 Units total) into the skin 3 (three) times daily before meals.  For BS > 200 (Patient not taking: Reported on 01/14/2016) 10 mL 0  . montelukast (SINGULAIR) 10 MG tablet Take 1 tablet (10 mg total) by mouth at bedtime. (Patient not taking: Reported on 09/17/2016) 90 tablet 3  . acetaminophen (TYLENOL) 325 MG tablet Take 650 mg by mouth as needed.      . Cholecalciferol (VITAMIN D3) 5000 UNITS CAPS Take 1 tablet by mouth daily. Reported on 08/29/2015    . meloxicam (MOBIC) 15 MG tablet TAKE ONE TABLET BY MOUTH ONCE DAILY (Patient not taking: Reported on 06/11/2016) 90 tablet 1   No facility-administered medications prior to visit.     Review of Systems;  Patient denies headache, fevers, malaise, unintentional weight loss, skin rash, eye  pain, sinus congestion and sinus pain, sore throat, dysphagia,  hemoptysis , cough, dyspnea, wheezing, chest pain, palpitations, orthopnea, edema, abdominal pain, nausea, melena, diarrhea, constipation, flank pain, dysuria, hematuria, urinary  Frequency, nocturia, numbness, tingling, seizures,  Focal weakness, Loss of consciousness,  Tremor, insomnia, depression, anxiety, and suicidal ideation.      Objective:  BP 104/68 (BP Location: Left Arm, Patient Position: Sitting, Cuff Size: Large)   Pulse 92   Temp 98 F (36.7 C) (Oral)   Resp 17   Ht 5\' 5"  (1.651 m)   Wt 260 lb 6.4 oz (118.1 kg)   SpO2 95%   BMI 43.33 kg/m   BP Readings from Last 3 Encounters:  09/17/16 104/68  06/11/16 108/72  03/05/16 116/86    Wt Readings from Last 3 Encounters:  09/17/16 260 lb 6.4 oz (118.1 kg)  06/11/16 260 lb 6.4 oz (118.1 kg)  03/05/16 260 lb (117.9 kg)    General appearance: alert, cooperative and appears stated age Ears: normal TM's and external ear canals both ears Throat: lips, mucosa, and tongue normal; teeth and gums normal Neck: no adenopathy, no carotid bruit, supple, symmetrical, trachea midline and thyroid not enlarged, symmetric, no tenderness/mass/nodules Back: symmetric, no curvature. ROM normal. No CVA tenderness. Lungs: clear to auscultation bilaterally Heart: regular rate and rhythm, S1, S2 normal, no murmur, click, rub or gallop Abdomen: soft, non-tender; bowel sounds normal; no masses,  no organomegaly Pulses: 2+ and symmetric Skin: Skin color, texture, turgor normal. No rashes or lesions Lymph nodes: Cervical, supraclavicular, and axillary nodes normal.  Lab Results  Component Value Date   HGBA1C 5.9 06/11/2016   HGBA1C 5.8 03/05/2016   HGBA1C 5.6 12/02/2015    Lab Results  Component Value Date   CREATININE 0.96 09/17/2016   CREATININE 0.86 06/11/2016   CREATININE 0.77 03/05/2016    Lab Results  Component Value Date   WBC 5.3 08/08/2012   HGB 13.5 08/08/2012    HCT 41.2 08/08/2012   PLT 216.0 08/08/2012   GLUCOSE 106 (H) 09/17/2016   CHOL 269 (H) 06/11/2016   TRIG 284.0 (H) 06/11/2016   HDL 58.30 06/11/2016   LDLDIRECT 164.0 06/11/2016   LDLCALC 167 (H) 05/26/2013   ALT 24 09/17/2016   AST 19 09/17/2016   NA 137 09/17/2016   K 4.3 09/17/2016   CL 102 09/17/2016   CREATININE 0.96 09/17/2016   BUN 19 09/17/2016   CO2 28 09/17/2016   TSH 1.57 03/05/2016   HGBA1C 5.9 06/11/2016   MICROALBUR 0.7 05/09/2015    Mm Screening Breast Tomo Bilateral  Result Date: 10/03/2015 CLINICAL DATA:  Screening. EXAM: 2D DIGITAL SCREENING BILATERAL MAMMOGRAM WITH CAD AND ADJUNCT TOMO COMPARISON:  Previous exam(s). ACR Breast Density Category c: The breast tissue is heterogeneously  dense, which may obscure small masses. FINDINGS: There are no findings suspicious for malignancy. Images were processed with CAD. IMPRESSION: No mammographic evidence of malignancy. A result letter of this screening mammogram will be mailed directly to the patient. RECOMMENDATION: Screening mammogram in one year. (Code:SM-B-01Y) BI-RADS CATEGORY  1: Negative. Electronically Signed   By: Ammie Ferrier M.D.   On: 10/03/2015 09:33    Assessment & Plan:   Problem List Items Addressed This Visit    Obesity (BMI 30-39.9)    I have addressed  BMI and recommended wt loss of 10% of body weigh over the next 6 months using a low glycemic index diet and regular exercise a minimum of 5 days per week.        Diabetes mellitus type 2 in obese (HCC)     well-controlled on 24 units of Lantus and metformin.   hemoglobin A1c has been consistently at or  less than 6.0 . Patient is using the freestyle [place] system to monitor blood sugars and to lose weight. she is up-to-date on eye exams and foot exam is normal today except for pes planus. . Patient has no evidence of  Microalbuminuria.  Patient is tolerating aspirin and red yeast rice  for CAD risk reduction and is already on on ACE/ARB .\  Lab  Results  Component Value Date   HGBA1C 5.9 06/11/2016   Lab Results  Component Value Date   MICROALBUR 0.7 05/09/2015                    Depression with anxiety    Managed for years with Paxil,  No changes today      Chronic right shoulder pain - Primary    Aggravated by fall in June.  No joint separation or fractures by plain films today  Referral to sports medicine advised.       Relevant Orders   DG Shoulder Right (Completed)   Ambulatory referral to Orthopedic Surgery    Other Visit Diagnoses    Elevated liver enzymes       Relevant Orders   Comprehensive metabolic panel (Completed)      I have discontinued Ms. Switalski's Vitamin D3, acetaminophen, and meloxicam. I am also having her start on diclofenac sodium. Additionally, I am having her maintain her aspirin, glucose blood, co-enzyme Q-10, TURMERIC PO, Syringe (Disposable), SYRINGE 3CC/25GX1", fexofenadine, magnesium gluconate, amoxicillin, montelukast, insulin regular, INS SYRINGE/NEEDLE 1CC/28G, Blood Glucose Monitoring Suppl, PARoxetine, Insulin Glargine, lisinopril, levothyroxine, cyanocobalamin, and Cholecalciferol.  Meds ordered this encounter  Medications  . Cholecalciferol 50000 units capsule    Sig: Take 50,000 Units by mouth once a week.  . diclofenac sodium (VOLTAREN) 1 % GEL    Sig: Apply 2 g topically 4 (four) times daily.    Dispense:  100 g    Refill:  3    Medications Discontinued During This Encounter  Medication Reason  . acetaminophen (TYLENOL) 325 MG tablet Patient has not taken in last 30 days  . Cholecalciferol (VITAMIN D3) 5000 UNITS CAPS Patient has not taken in last 30 days  . meloxicam (MOBIC) 15 MG tablet Patient has not taken in last 30 days    Follow-up: Return in about 3 months (around 12/18/2016) for follow up diabetes.   Crecencio Mc, MD

## 2016-09-17 NOTE — Patient Instructions (Signed)
If your shoulrovder doe snot ,e will make the referral to [facility]

## 2016-09-19 ENCOUNTER — Encounter: Payer: Self-pay | Admitting: Internal Medicine

## 2016-09-20 NOTE — Assessment & Plan Note (Signed)
Managed for years with Paxil,  No changes today

## 2016-09-20 NOTE — Assessment & Plan Note (Signed)
Aggravated by fall in June.  No joint separation or fractures by plain films today  Referral to sports medicine advised.

## 2016-09-20 NOTE — Assessment & Plan Note (Signed)
well-controlled on 24 units of Lantus and metformin.   hemoglobin A1c has been consistently at or  less than 6.0 . Patient is using the freestyle [place] system to monitor blood sugars and to lose weight. she is up-to-date on eye exams and foot exam is normal today except for pes planus. . Patient has no evidence of  Microalbuminuria.  Patient is tolerating aspirin and red yeast rice  for CAD risk reduction and is already on on ACE/ARB .\  Lab Results  Component Value Date   HGBA1C 5.9 06/11/2016   Lab Results  Component Value Date   MICROALBUR 0.7 05/09/2015

## 2016-09-20 NOTE — Assessment & Plan Note (Signed)
I have addressed  BMI and recommended wt loss of 10% of body weigh over the next 6 months using a low glycemic index diet and regular exercise a minimum of 5 days per week.   

## 2016-09-29 DIAGNOSIS — E119 Type 2 diabetes mellitus without complications: Secondary | ICD-10-CM | POA: Insufficient documentation

## 2016-09-29 DIAGNOSIS — G8929 Other chronic pain: Secondary | ICD-10-CM | POA: Diagnosis not present

## 2016-09-29 DIAGNOSIS — M5030 Other cervical disc degeneration, unspecified cervical region: Secondary | ICD-10-CM | POA: Diagnosis not present

## 2016-09-29 DIAGNOSIS — Z6841 Body Mass Index (BMI) 40.0 and over, adult: Secondary | ICD-10-CM | POA: Diagnosis not present

## 2016-09-29 DIAGNOSIS — M7551 Bursitis of right shoulder: Secondary | ICD-10-CM | POA: Diagnosis not present

## 2016-09-29 DIAGNOSIS — M25511 Pain in right shoulder: Secondary | ICD-10-CM | POA: Diagnosis not present

## 2016-09-29 DIAGNOSIS — M25512 Pain in left shoulder: Secondary | ICD-10-CM | POA: Diagnosis not present

## 2016-10-15 ENCOUNTER — Other Ambulatory Visit: Payer: Self-pay | Admitting: Internal Medicine

## 2016-10-15 NOTE — Telephone Encounter (Signed)
Ok to refill,  Has DJD knee.

## 2016-10-15 NOTE — Telephone Encounter (Signed)
Per the chart not currently on Tramadol. Last refill/rx was in September last year to take till January 2018.  Please advise for refill, last OV was 09/17/16

## 2016-11-04 NOTE — Telephone Encounter (Signed)
Error

## 2016-11-05 ENCOUNTER — Other Ambulatory Visit: Payer: Self-pay | Admitting: Internal Medicine

## 2016-11-05 DIAGNOSIS — Z1231 Encounter for screening mammogram for malignant neoplasm of breast: Secondary | ICD-10-CM

## 2016-11-09 ENCOUNTER — Ambulatory Visit
Admission: RE | Admit: 2016-11-09 | Discharge: 2016-11-09 | Disposition: A | Discharge status: Home / Self Care | Payer: Medicare Other | Source: Ambulatory Visit | Attending: Internal Medicine | Admitting: Internal Medicine

## 2016-11-09 DIAGNOSIS — Z1231 Encounter for screening mammogram for malignant neoplasm of breast: Secondary | ICD-10-CM | POA: Diagnosis not present

## 2016-11-28 ENCOUNTER — Other Ambulatory Visit: Payer: Self-pay | Admitting: Internal Medicine

## 2016-12-21 ENCOUNTER — Encounter: Payer: Self-pay | Admitting: Internal Medicine

## 2016-12-21 ENCOUNTER — Ambulatory Visit (INDEPENDENT_AMBULATORY_CARE_PROVIDER_SITE_OTHER): Payer: Medicare Other | Admitting: Internal Medicine

## 2016-12-21 VITALS — BP 112/68 | HR 81 | Temp 98.0°F | Resp 16 | Ht 65.0 in | Wt 268.8 lb

## 2016-12-21 DIAGNOSIS — R740 Nonspecific elevation of levels of transaminase and lactic acid dehydrogenase [LDH]: Secondary | ICD-10-CM | POA: Diagnosis not present

## 2016-12-21 DIAGNOSIS — R7401 Elevation of levels of liver transaminase levels: Secondary | ICD-10-CM

## 2016-12-21 DIAGNOSIS — E034 Atrophy of thyroid (acquired): Secondary | ICD-10-CM

## 2016-12-21 DIAGNOSIS — E785 Hyperlipidemia, unspecified: Secondary | ICD-10-CM | POA: Diagnosis not present

## 2016-12-21 DIAGNOSIS — E1169 Type 2 diabetes mellitus with other specified complication: Secondary | ICD-10-CM | POA: Diagnosis not present

## 2016-12-21 DIAGNOSIS — I10 Essential (primary) hypertension: Secondary | ICD-10-CM | POA: Diagnosis not present

## 2016-12-21 DIAGNOSIS — E669 Obesity, unspecified: Secondary | ICD-10-CM

## 2016-12-21 DIAGNOSIS — Z23 Encounter for immunization: Secondary | ICD-10-CM

## 2016-12-21 LAB — HEMOGLOBIN A1C: Hgb A1c MFr Bld: 6 % (ref 4.6–6.5)

## 2016-12-21 LAB — COMPREHENSIVE METABOLIC PANEL
ALBUMIN: 4 g/dL (ref 3.5–5.2)
ALK PHOS: 64 U/L (ref 39–117)
ALT: 20 U/L (ref 0–35)
AST: 15 U/L (ref 0–37)
BUN: 17 mg/dL (ref 6–23)
CO2: 27 mEq/L (ref 19–32)
Calcium: 9.8 mg/dL (ref 8.4–10.5)
Chloride: 103 mEq/L (ref 96–112)
Creatinine, Ser: 0.83 mg/dL (ref 0.40–1.20)
GFR: 70.59 mL/min (ref >60.00)
Glucose, Bld: 102 mg/dL — ABNORMAL HIGH (ref 70–99)
POTASSIUM: 5.1 meq/L (ref 3.5–5.1)
Sodium: 138 mEq/L (ref 135–145)
TOTAL PROTEIN: 6.9 g/dL (ref 6.0–8.3)
Total Bilirubin: 0.5 mg/dL (ref 0.2–1.2)

## 2016-12-21 LAB — MICROALBUMIN / CREATININE URINE RATIO
Creatinine,U: 57.2 mg/dL
MICROALB/CREAT RATIO: 1.2 mg/g (ref 0.0–30.0)

## 2016-12-21 LAB — LDL CHOLESTEROL, DIRECT: Direct LDL: 186 mg/dL

## 2016-12-21 LAB — TSH: TSH: 2.61 u[IU]/mL (ref 0.35–4.50)

## 2016-12-21 MED ORDER — FREESTYLE LIBRE 14 DAY READER DEVI: 1.0000 [IU] | Freq: Three times a day (TID) | 0 refills | Status: DC

## 2016-12-21 MED ORDER — FREESTYLE LIBRE SENSOR SYSTEM MISC: 11 refills | Status: DC

## 2016-12-21 NOTE — Assessment & Plan Note (Signed)
well-controlled on 22 units of Lantus and metformin.   hemoglobin A1c has been consistently at or  less than 6.0 . Patient is using the freestyle [place] system to monitor blood sugars but has gained weight due to lack of exercise.  and to lose weight. she is up-to-date on eye exams and foot exam is normal today except for pes planus. . Patient has no evidence of  Microalbuminuria.  Patient is tolerating aspirin and red yeast rice  for CAD risk reduction and is already on on ACE/ARB .\  Lab Results  Component Value Date   HGBA1C 6.0 12/21/2016   Lab Results  Component Value Date   MICROALBUR <0.7 12/21/2016               well-controlled on 24 units of Lantus and metformin.   hemoglobin A1c has been consistently at or  less than 6.0 . Patient is using the freestyle [place] system to monitor blood sugars and to lose weight. she is up-to-date on eye exams and foot exam is normal today except for pes planus. . Patient has no evidence of  Microalbuminuria.  Patient is tolerating aspirin and red yeast rice  for CAD risk reduction and is already on on ACE/ARB .\  Lab Results  Component Value Date   HGBA1C 6.0 12/21/2016   Lab Results  Component Value Date   MICROALBUR <0.7 12/21/2016

## 2016-12-21 NOTE — Patient Instructions (Signed)

## 2016-12-21 NOTE — Assessment & Plan Note (Signed)
Repeat liver enzymes are normal   Lab Results  Component Value Date   ALT 20 12/21/2016   AST 15 12/21/2016   ALKPHOS 64 12/21/2016   BILITOT 0.5 12/21/2016

## 2016-12-21 NOTE — Progress Notes (Signed)
Subjective:  Patient ID: Brenda Floyd, female    DOB: Apr 20, 1940  Age: 76 y.o. MRN: [id number]  CC: The primary encounter diagnosis was Diabetes mellitus type 2 in obese ([place]). Diagnoses of Hypothyroidism due to acquired atrophy of thyroid, Hyperlipidemia with target LDL less than 70, Need for 23-polyvalent pneumococcal polysaccharide vaccine, Essential hypertension, Obesity (BMI 30-39.9), and ALT (SGPT) level raised were also pertinent to this visit.  HPI Brenda Floyd presents for 3 month follow up on diabetes.  Patient has no complaints today.  Patient is following a low glycemic index diet and taking all prescribed medications regularly without side effects.  Fasting sugars have been under less than 100 most of the time and post prandials have been under 120 except on rare occasions. Patient is not exercising and has gained 8 lbs since her last visit.   Patient has had an eye exam in the last 12 months and checks feet regularly for signs of infection.  Patient does not walk barefoot outside,  And denies an numbness tingling or burning in feet. Patient is up to date on all recommended vaccinations  Using 22 units lantus.   She is using freesytle libre system for blood sugar montoring and is requesting the new version     she continues to have bilateral shoulder pain THAT LIMITS HER ROM  LEFT > RIGHT.  Lab Results  Component Value Date   MICROALBUR <0.7 12/21/2016     Lab Results  Component Value Date   HGBA1C 6.0 12/21/2016    WEIGHT GAIN OF 8 LBS   HER AKBASH DIED TURKISH GUARD DOG (152 LBS!!!) AT THE AGE OF TEN  GOATS EAT POISON IVY AND ALL UNDERRUSH  CONSIDERABLE SHOULDER PAIN persistent ,  after her  FALL ONTO RIGHT ONE LAST YEAR STILL BOTHERING HER .  HAS INTERMITTENT NUMBNESS  OF FINGERS ON RIGHT,ALWAYS AT NIGHT   HAS CERVICAL DISK DIsease . Not interested  in surgery of either    Sees [facility] in Dec for right cataract extraction. Uses reading glasses.    Outpatient Medications Prior to Visit  Medication Sig Dispense Refill  . aspirin 81 MG tablet Take 1 tablet (81 mg total) by mouth daily. 30 tablet 11  . Blood Glucose Monitoring Suppl DEVI Freestyle Libre monitor 1 each 0  . Cholecalciferol 50000 units capsule Take 50,000 Units by mouth once a week.    . co-enzyme Q-10 30 MG capsule Take 1 capsule (30 mg total) by mouth daily. 90 capsule 3  . diclofenac sodium (VOLTAREN) 1 % GEL Apply 2 g topically 4 (four) times daily. 100 g 3  . fexofenadine (ALLEGRA) 180 MG tablet Take 180 mg by mouth daily.    Marland Kitchen glucose blood (FREESTYLE LITE) test strip Use to check blood sugars once daily,  Twice if needed   DM 250.00 102 each 3  . INS SYRINGE/NEEDLE 1CC/28G (B-D INSULIN SYRINGE 1CC/28G) 28G X 1/2" 1 ML MISC 1 Syringe by Does not apply route daily after supper. For use with insulin 10 each 0  . Insulin Glargine (LANTUS SOLOSTAR) 100 UNIT/ML Solostar Pen INJECT 22 UNITS UNDER THE SKIN DAILY 15 mL 6  . levothyroxine (SYNTHROID, LEVOTHROID) 75 MCG tablet TAKE 1 TABLET DAILY FOR HYPOTHYROIDISM 90 tablet 0  . lisinopril (PRINIVIL,ZESTRIL) 20 MG tablet TAKE ONE TABLET BY MOUTH ONCE DAILY 90 tablet 1  . magnesium gluconate (MAGONATE) 500 MG tablet Take 500 mg by mouth daily.    Marland Kitchen PARoxetine (PAXIL) 20 MG  tablet TAKE 1 TABLET DAILY 90 tablet 2  . Syringe/Needle, Disp, (SYRINGE 3CC/25GX1") 25G X 1" 3 ML MISC Use as directed 50 each 0  . TURMERIC PO Take by mouth daily.    . vitamin B-12 (CYANOCOBALAMIN) 1000 MCG tablet Take 1,000 mcg daily by mouth.    Marland Kitchen amoxicillin (AMOXIL) 500 MG capsule TAKE 4 CAPSULES BY MOUTH ONE HOUR BEFORE APPOINTMENT (Patient not taking: Reported on 09/17/2016) 4 capsule 0  . insulin regular (HUMULIN R) 100 units/mL injection Inject 0.1 mLs (10 Units total) into the skin 3 (three) times daily before meals. For BS > 200 (Patient not taking: Reported on 01/14/2016) 10 mL 0  . montelukast (SINGULAIR) 10 MG tablet Take 1 tablet (10 mg total) by  mouth at bedtime. (Patient not taking: Reported on 09/17/2016) 90 tablet 3  . cyanocobalamin (,VITAMIN B-12,) 1000 MCG/ML injection INJECT 1 ML MONTHLY (Patient not taking: Reported on 12/21/2016) 10 mL 2  . Syringe, Disposable, 1 ML MISC For use with b12 serum (Patient not taking: Reported on 12/21/2016) 25 each 2  . traMADol (ULTRAM) 50 MG tablet TAKE ONE TABLET BY MOUTH 4 TIMES DAILY AS NEEDED KNEE  PAIN (Patient not taking: Reported on 12/21/2016) 120 tablet 5   No facility-administered medications prior to visit.     Review of Systems;  Patient denies headache, fevers, malaise, unintentional weight loss, skin rash, eye pain, sinus congestion and sinus pain, sore throat, dysphagia,  hemoptysis , cough, dyspnea, wheezing, chest pain, palpitations, orthopnea, edema, abdominal pain, nausea, melena, diarrhea, constipation, flank pain, dysuria, hematuria, urinary  Frequency, nocturia, numbness, tingling, seizures,  Focal weakness, Loss of consciousness,  Tremor, insomnia, depression, anxiety, and suicidal ideation.      Objective:  BP 112/68 (BP Location: Left Arm, Patient Position: Sitting, Cuff Size: Large)   Pulse 81   Temp 98 F (36.7 C) (Oral)   Resp 16   Ht 5\' 5"  (1.651 m)   Wt 268 lb 12.8 oz (121.9 kg)   SpO2 96%   BMI 44.73 kg/m   BP Readings from Last 3 Encounters:  12/21/16 112/68  09/17/16 104/68  06/11/16 108/72    Wt Readings from Last 3 Encounters:  12/21/16 268 lb 12.8 oz (121.9 kg)  09/17/16 260 lb 6.4 oz (118.1 kg)  06/11/16 260 lb 6.4 oz (118.1 kg)    General appearance: alert, cooperative and appears stated age Ears: normal TM's and external ear canals both ears Throat: lips, mucosa, and tongue normal; teeth and gums normal Neck: no adenopathy, no carotid bruit, supple, symmetrical, trachea midline and thyroid not enlarged, symmetric, no tenderness/mass/nodules Back: symmetric, no curvature. ROM normal. No CVA tenderness. Lungs: clear to auscultation  bilaterally Heart: regular rate and rhythm, S1, S2 normal, no murmur, click, rub or gallop Abdomen: soft, non-tender; bowel sounds normal; no masses,  no organomegaly Pulses: 2+ and symmetric Skin: Skin color, texture, turgor normal. No rashes or lesions Lymph nodes: Cervical, supraclavicular, and axillary nodes normal.  Lab Results  Component Value Date   HGBA1C 6.0 12/21/2016   HGBA1C 5.9 06/11/2016   HGBA1C 5.8 03/05/2016    Lab Results  Component Value Date   CREATININE 0.83 12/21/2016   CREATININE 0.96 09/17/2016   CREATININE 0.86 06/11/2016    Lab Results  Component Value Date   WBC 5.3 08/08/2012   HGB 13.5 08/08/2012   HCT 41.2 08/08/2012   PLT 216.0 08/08/2012   GLUCOSE 102 (H) 12/21/2016   CHOL 269 (H) 06/11/2016   TRIG 284.0 (  H) 06/11/2016   HDL 58.30 06/11/2016   LDLDIRECT 186.0 12/21/2016   LDLCALC 167 (H) 05/26/2013   ALT 20 12/21/2016   AST 15 12/21/2016   NA 138 12/21/2016   K 5.1 12/21/2016   CL 103 12/21/2016   CREATININE 0.83 12/21/2016   BUN 17 12/21/2016   CO2 27 12/21/2016   TSH 2.61 12/21/2016   HGBA1C 6.0 12/21/2016   MICROALBUR <0.7 12/21/2016    Mm Screening Breast Tomo Bilateral  Result Date: 11/16/2016 CLINICAL DATA:  Screening. EXAM: 2D DIGITAL SCREENING BILATERAL MAMMOGRAM WITH CAD AND ADJUNCT TOMO COMPARISON:  Previous exam(s). ACR Breast Density Category c: The breast tissue is heterogeneously dense, which may obscure small masses. FINDINGS: There are no findings suspicious for malignancy. Images were processed with CAD. IMPRESSION: No mammographic evidence of malignancy. A result letter of this screening mammogram will be mailed directly to the patient. RECOMMENDATION: Screening mammogram in one year. (Code:[id number]-B-01Y) BI-RADS CATEGORY  1: Negative. Electronically Signed   By: Franki Cabot M.D.   On: 11/16/2016 09:53    Assessment & Plan:   Problem List Items Addressed This Visit    ALT (SGPT) level raised    Repeat liver enzymes  are normal   Lab Results  Component Value Date   ALT 20 12/21/2016   AST 15 12/21/2016   ALKPHOS 64 12/21/2016   BILITOT 0.5 12/21/2016         Diabetes mellitus type 2 in obese ([place]) - Primary     well-controlled on 22 units of Lantus and metformin.   hemoglobin A1c has been consistently at or  less than 6.0 . Patient is using the freestyle [place] system to monitor blood sugars but has gained weight due to lack of exercise.  and to lose weight. she is up-to-date on eye exams and foot exam is normal today except for pes planus. . Patient has no evidence of  Microalbuminuria.  Patient is tolerating aspirin and red yeast rice  for CAD risk reduction and is already on on ACE/ARB .\  Lab Results  Component Value Date   HGBA1C 6.0 12/21/2016   Lab Results  Component Value Date   MICROALBUR <0.7 12/21/2016               well-controlled on 24 units of Lantus and metformin.   hemoglobin A1c has been consistently at or  less than 6.0 . Patient is using the freestyle [place] system to monitor blood sugars and to lose weight. she is up-to-date on eye exams and foot exam is normal today except for pes planus. . Patient has no evidence of  Microalbuminuria.  Patient is tolerating aspirin and red yeast rice  for CAD risk reduction and is already on on ACE/ARB .\  Lab Results  Component Value Date   HGBA1C 6.0 12/21/2016   Lab Results  Component Value Date   MICROALBUR <0.7 12/21/2016                    Relevant Orders   Hemoglobin A1c (Completed)   Comprehensive metabolic panel (Completed)   Microalbumin / creatinine urine ratio (Completed)   Essential hypertension    Well controlled on current regimen. Renal function stable, no changes today.  Lab Results  Component Value Date   CREATININE 0.83 12/21/2016   Lab Results  Component Value Date   NA 138 12/21/2016   K 5.1 12/21/2016   CL 103 12/21/2016   CO2 27 12/21/2016  Hyperlipidemia with target  LDL less than 70   Relevant Orders   LDL cholesterol, direct (Completed)   Hypothyroidism due to acquired atrophy of thyroid   Relevant Orders   TSH (Completed)   Obesity (BMI 30-39.9)    I have addressed  BMI and recommended wt loss of 10% of body weigh over the next 6 months using a low glycemic index diet and regular exercise a minimum of 5 days per week.         Other Visit Diagnoses    Need for 23-polyvalent pneumococcal polysaccharide vaccine       Relevant Orders   Pneumococcal polysaccharide vaccine 23-valent greater than or equal to 2yo subcutaneous/IM (Completed)      I have discontinued Robet Leu Syringe (Disposable), cyanocobalamin, and traMADol. I am also having her start on FREESTYLE LIBRE 14 DAY READER and [place]. Additionally, I am having her maintain her aspirin, glucose blood, co-enzyme Q-10, TURMERIC PO, SYRINGE 3CC/25GX1", fexofenadine, magnesium gluconate, amoxicillin, montelukast, insulin regular, INS SYRINGE/NEEDLE 1CC/28G, Blood Glucose Monitoring Suppl, PARoxetine, Insulin Glargine, lisinopril, Cholecalciferol, diclofenac sodium, levothyroxine, and vitamin B-12.  Meds ordered this encounter  Medications  . vitamin B-12 (CYANOCOBALAMIN) 1000 MCG tablet    Sig: Take 1,000 mcg daily by mouth.  . Continuous Blood Gluc Receiver (FREESTYLE LIBRE 14 DAY READER) DEVI    Sig: 1 Units every 8 (eight) hours by Does not apply route.    Dispense:  1 Device    Refill:  0  . Continuous Blood Gluc Sensor (FREESTYLE LIBRE SENSOR SYSTEM) MISC    Sig: Use with freestyle reader    Dispense:  2 each    Refill:  11    Medications Discontinued During This Encounter  Medication Reason  . cyanocobalamin (,VITAMIN B-12,) 1000 MCG/ML injection Completed Course  . traMADol (ULTRAM) 50 MG tablet Patient has not taken in last 30 days  . Syringe, Disposable, 1 ML MISC Patient has not taken in last 30 days   A total of 25 minutes of face to face  time was spent with patient more than half of which was spent in counselling about the above mentioned conditions  and coordination of care  Follow-up: No Follow-up on file.   Crecencio Mc, MD

## 2016-12-21 NOTE — Assessment & Plan Note (Signed)
Well controlled on current regimen. Renal function stable, no changes today.  Lab Results  Component Value Date   CREATININE 0.83 12/21/2016   Lab Results  Component Value Date   NA 138 12/21/2016   K 5.1 12/21/2016   CL 103 12/21/2016   CO2 27 12/21/2016

## 2016-12-21 NOTE — Assessment & Plan Note (Signed)
I have addressed  BMI and recommended wt loss of 10% of body weigh over the next 6 months using a low glycemic index diet and regular exercise a minimum of 5 days per week.   

## 2017-01-13 ENCOUNTER — Other Ambulatory Visit: Payer: Self-pay | Admitting: Internal Medicine

## 2017-01-13 ENCOUNTER — Ambulatory Visit: Payer: Medicare Other

## 2017-01-15 DIAGNOSIS — H2513 Age-related nuclear cataract, bilateral: Secondary | ICD-10-CM | POA: Diagnosis not present

## 2017-01-15 LAB — HM DIABETES EYE EXAM

## 2017-01-18 ENCOUNTER — Other Ambulatory Visit: Payer: Self-pay | Admitting: Internal Medicine

## 2017-01-29 ENCOUNTER — Ambulatory Visit: Payer: Medicare Other

## 2017-02-26 IMAGING — DX DG SHOULDER 2+V*L*
3 series · 3 of 3 positions shown · non-contrast
Comparison: None.

CLINICAL DATA: Chronic pain and left shoulder

EXAM:
LEFT SHOULDER - 2+ VIEW

[shoulder (grashey) ap]
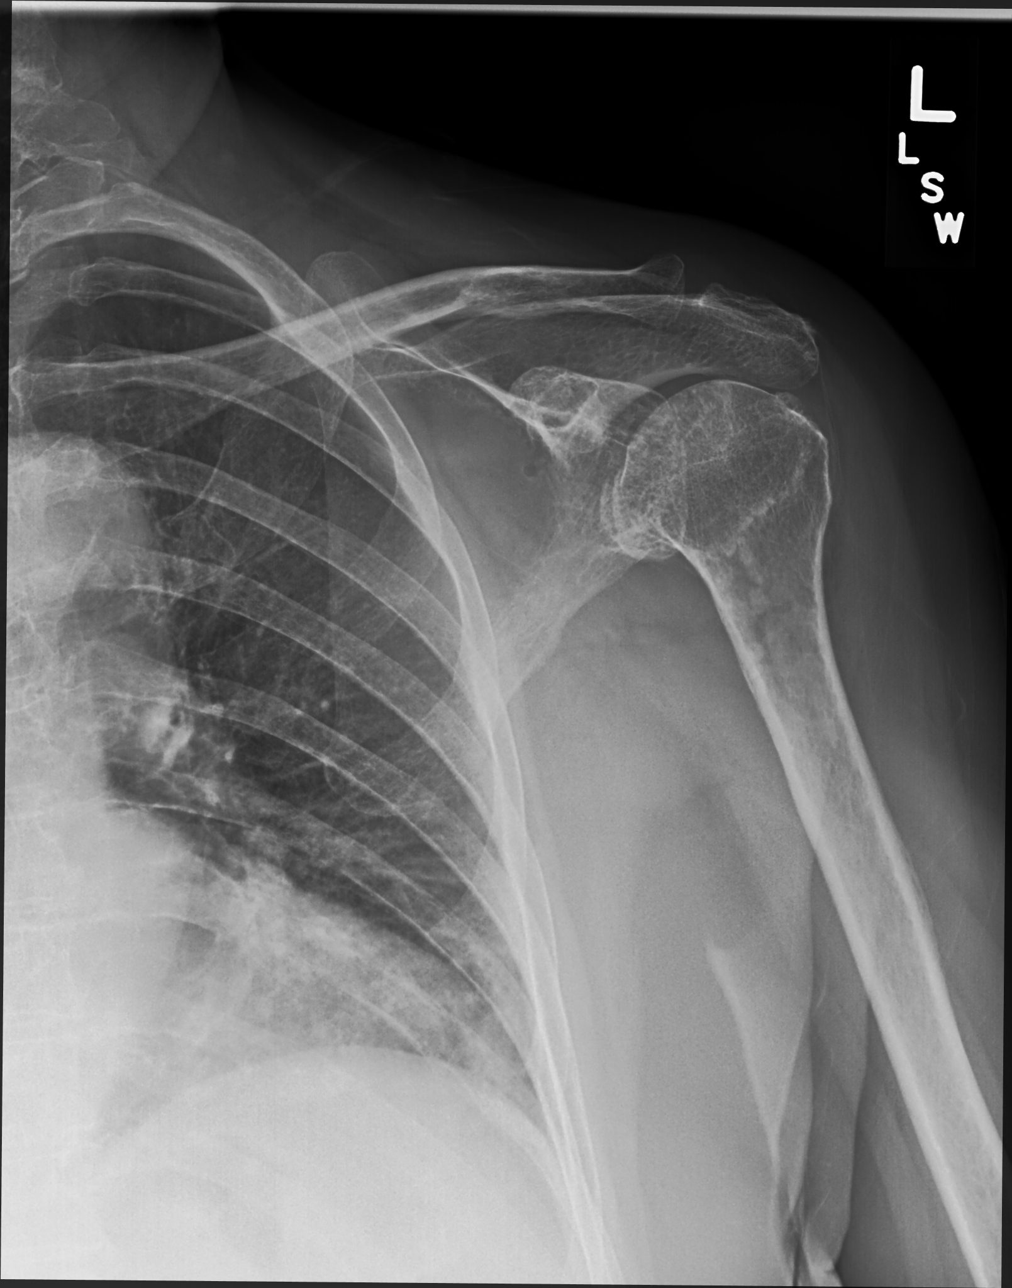

[shoulder y view]
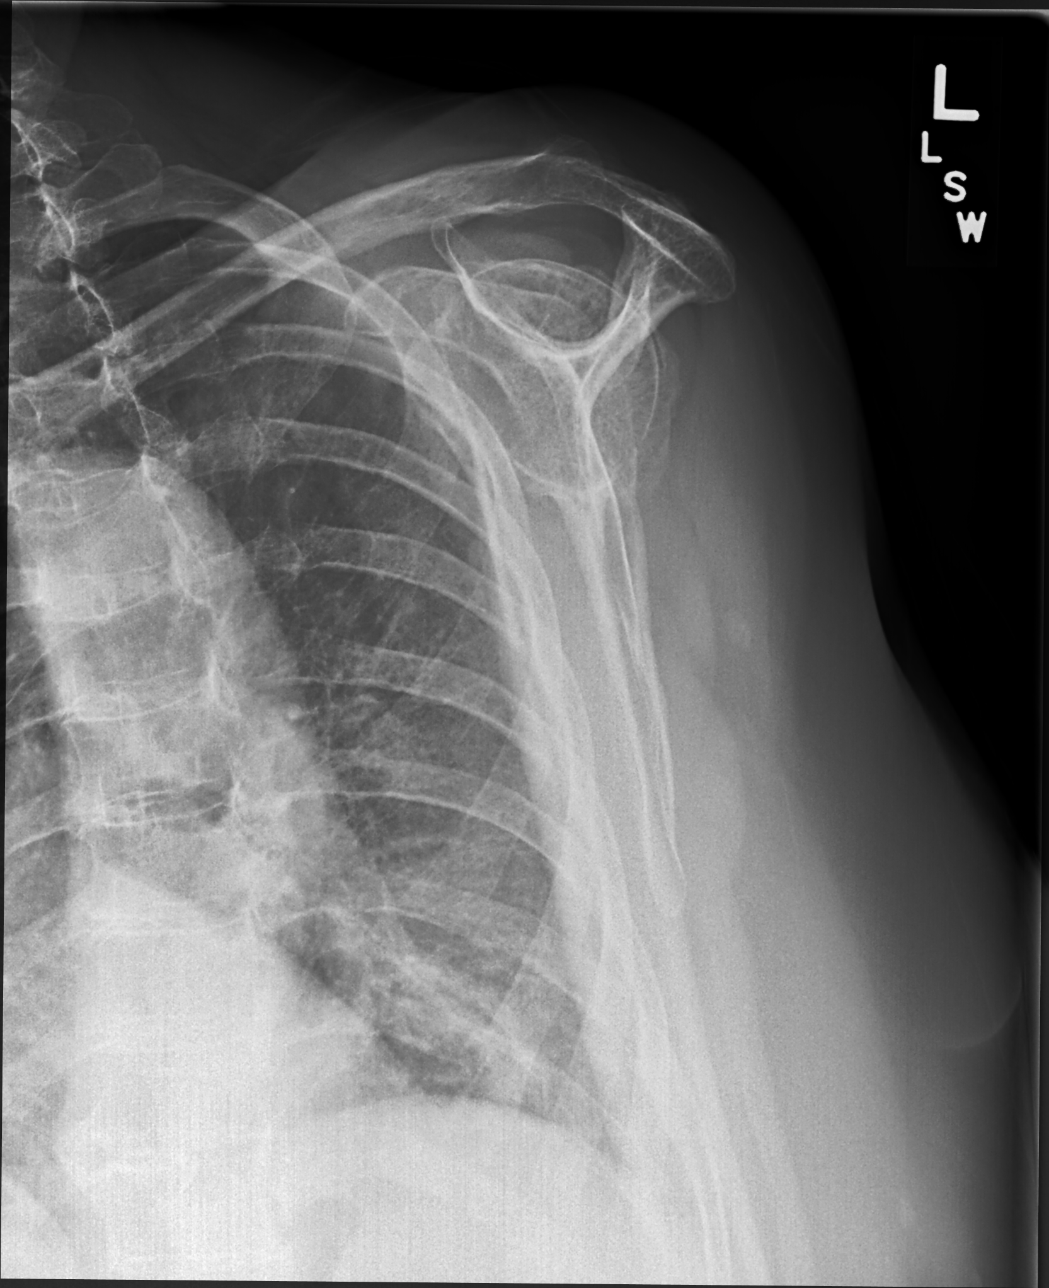

[shoulder axillary pa]
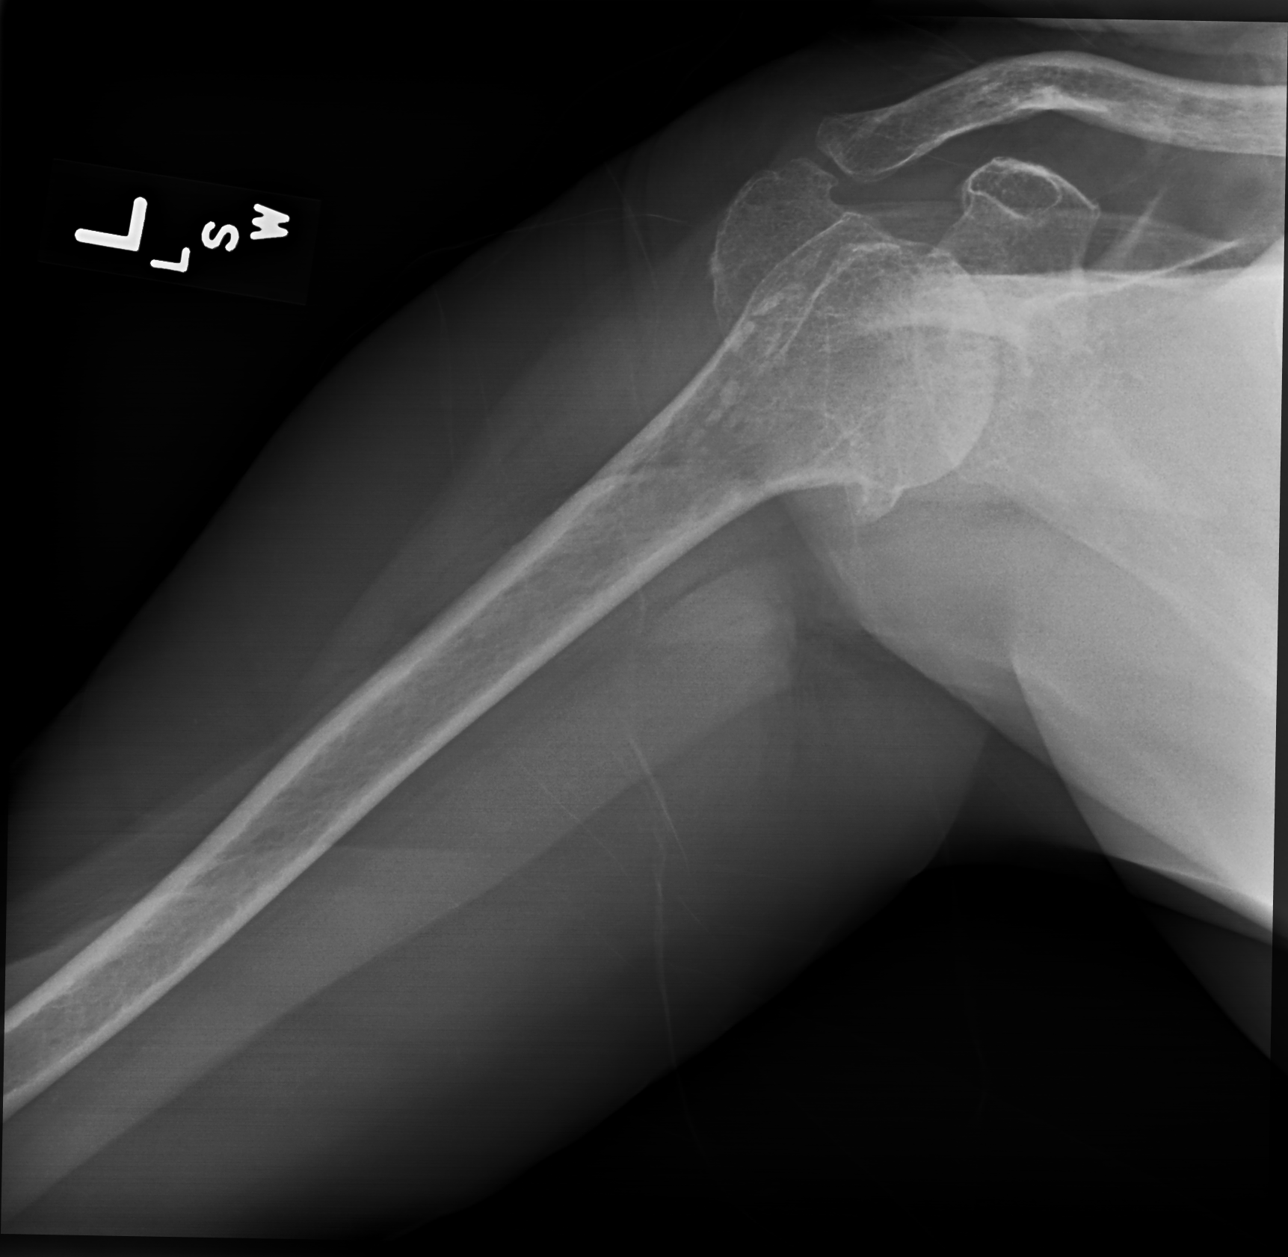

[3 of 3 positions shown; findings below may reference images not displayed]

FINDINGS: Degenerative changes in the glenohumeral joint with joint space
narrowing and spurring. AC joint is maintained. No acute bony
abnormality. Specifically, no fracture, subluxation, or dislocation.
Soft tissues are intact.
IMPRESSION: Mild to moderate degenerative changes in the left glenohumeral
joint. No acute bony abnormality.

## 2017-02-27 ENCOUNTER — Other Ambulatory Visit: Payer: Self-pay | Admitting: Internal Medicine

## 2017-03-22 DIAGNOSIS — H2511 Age-related nuclear cataract, right eye: Secondary | ICD-10-CM | POA: Diagnosis not present

## 2017-03-23 ENCOUNTER — Ambulatory Visit: Payer: Medicare Other | Admitting: Internal Medicine

## 2017-04-05 NOTE — Discharge Instructions (Signed)

## 2017-04-06 ENCOUNTER — Ambulatory Visit: Payer: Medicare Other | Admitting: Anesthesiology

## 2017-04-06 ENCOUNTER — Encounter
Admission: RE | Disposition: A | Discharge status: Home / Self Care | Payer: Self-pay | Source: Ambulatory Visit | Attending: Ophthalmology

## 2017-04-06 ENCOUNTER — Ambulatory Visit
Admission: RE | Admit: 2017-04-06 | Discharge: 2017-04-06 | Disposition: A | Discharge status: Home / Self Care | Payer: Medicare Other | Source: Ambulatory Visit | Attending: Ophthalmology | Admitting: Ophthalmology

## 2017-04-06 DIAGNOSIS — E1036 Type 1 diabetes mellitus with diabetic cataract: Secondary | ICD-10-CM | POA: Insufficient documentation

## 2017-04-06 DIAGNOSIS — Z79899 Other long term (current) drug therapy: Secondary | ICD-10-CM | POA: Diagnosis not present

## 2017-04-06 DIAGNOSIS — Z794 Long term (current) use of insulin: Secondary | ICD-10-CM | POA: Insufficient documentation

## 2017-04-06 DIAGNOSIS — Z79890 Hormone replacement therapy (postmenopausal): Secondary | ICD-10-CM | POA: Insufficient documentation

## 2017-04-06 DIAGNOSIS — Z96653 Presence of artificial knee joint, bilateral: Secondary | ICD-10-CM | POA: Diagnosis not present

## 2017-04-06 DIAGNOSIS — H2511 Age-related nuclear cataract, right eye: Secondary | ICD-10-CM | POA: Diagnosis not present

## 2017-04-06 DIAGNOSIS — E079 Disorder of thyroid, unspecified: Secondary | ICD-10-CM | POA: Diagnosis not present

## 2017-04-06 DIAGNOSIS — I10 Essential (primary) hypertension: Secondary | ICD-10-CM | POA: Insufficient documentation

## 2017-04-06 DIAGNOSIS — Z6841 Body Mass Index (BMI) 40.0 and over, adult: Secondary | ICD-10-CM | POA: Insufficient documentation

## 2017-04-06 DIAGNOSIS — Z87891 Personal history of nicotine dependence: Secondary | ICD-10-CM | POA: Insufficient documentation

## 2017-04-06 HISTORY — DX: Dyspnea, unspecified: R06.00

## 2017-04-06 HISTORY — PX: CATARACT EXTRACTION W/PHACO: SHX586

## 2017-04-06 HISTORY — DX: Unspecified osteoarthritis, unspecified site: M19.90

## 2017-04-06 LAB — GLUCOSE, CAPILLARY
GLUCOSE-CAPILLARY: 94 mg/dL (ref 65–99)
Glucose-Capillary: 107 mg/dL — ABNORMAL HIGH (ref 65–99)

## 2017-04-06 SURGERY — PHACOEMULSIFICATION, CATARACT, WITH IOL INSERTION
Anesthesia: Monitor Anesthesia Care | Site: Eye | Laterality: Right | Wound class: Clean

## 2017-04-06 MED ORDER — LIDOCAINE HCL (PF) 2 % IJ SOLN
INTRAMUSCULAR | Status: DC | PRN
  Administered 2017-04-06: 2 mL via INTRAOCULAR

## 2017-04-06 MED ORDER — ACETAMINOPHEN 325 MG PO TABS: 325.0000 mg | ORAL_TABLET | Freq: Once | ORAL | Status: DC

## 2017-04-06 MED ORDER — MOXIFLOXACIN HCL 0.5 % OP SOLN
OPHTHALMIC | Status: DC | PRN
Start: 2017-04-06 — End: 2017-04-06
  Administered 2017-04-06: 0.2 mL via OPHTHALMIC

## 2017-04-06 MED ORDER — ARMC OPHTHALMIC DILATING DROPS
1.0000 "application " | OPHTHALMIC | Status: DC | PRN
  Administered 2017-04-06 (×3): 1 via OPHTHALMIC

## 2017-04-06 MED ORDER — FENTANYL CITRATE (PF) 100 MCG/2ML IJ SOLN
INTRAMUSCULAR | Status: DC | PRN
  Administered 2017-04-06: 50 ug via INTRAVENOUS

## 2017-04-06 MED ORDER — SODIUM HYALURONATE 10 MG/ML IO SOLN
INTRAOCULAR | Status: DC | PRN
  Administered 2017-04-06: 0.55 mL via INTRAOCULAR

## 2017-04-06 MED ORDER — MIDAZOLAM HCL 2 MG/2ML IJ SOLN
INTRAMUSCULAR | Status: DC | PRN
  Administered 2017-04-06: 2 mg via INTRAVENOUS

## 2017-04-06 MED ORDER — SODIUM HYALURONATE 23 MG/ML IO SOLN
INTRAOCULAR | Status: DC | PRN
  Administered 2017-04-06: 0.6 mL via INTRAOCULAR

## 2017-04-06 MED ORDER — EPINEPHRINE PF 1 MG/ML IJ SOLN
INTRAMUSCULAR | Status: DC | PRN
  Administered 2017-04-06: 99 mL via OPHTHALMIC

## 2017-04-06 MED ORDER — LACTATED RINGERS IV SOLN: INTRAVENOUS | Status: DC

## 2017-04-06 MED ORDER — ACETAMINOPHEN 160 MG/5ML PO SOLN: 325.0000 mg | Freq: Once | ORAL | Status: DC

## 2017-04-06 SURGICAL SUPPLY — 16 items
CANNULA ANT/CHMB 27GA (MISCELLANEOUS) ×3 IMPLANT
DISSECTOR HYDRO NUCLEUS 50X22 (MISCELLANEOUS) ×3 IMPLANT
GLOVE BIO SURGEON STRL SZ8 (GLOVE) ×3 IMPLANT
GLOVE SURG LX 7.5 STRW (GLOVE) ×2
GLOVE SURG LX STRL 7.5 STRW (GLOVE) ×1 IMPLANT
GOWN STRL REUS W/ TWL LRG LVL3 (GOWN DISPOSABLE) ×2 IMPLANT
GOWN STRL REUS W/TWL LRG LVL3 (GOWN DISPOSABLE) ×4
LENS IOL TECNIS ITEC 22.0 (Intraocular Lens) ×3 IMPLANT
MARKER SKIN DUAL TIP RULER LAB (MISCELLANEOUS) ×3 IMPLANT
PACK CATARACT (MISCELLANEOUS) ×3 IMPLANT
PACK DR. KING ARMS (PACKS) ×3 IMPLANT
PACK EYE AFTER SURG (MISCELLANEOUS) ×3 IMPLANT
SYR 3ML LL SCALE MARK (SYRINGE) ×3 IMPLANT
SYR TB 1ML LUER SLIP (SYRINGE) ×3 IMPLANT
WATER STERILE IRR 500ML POUR (IV SOLUTION) ×3 IMPLANT
WIPE NON LINTING 3.25X3.25 (MISCELLANEOUS) ×3 IMPLANT

## 2017-04-06 NOTE — H&P (Signed)
The History and Physical notes are on paper, have been signed, and are to be scanned.   I have examined the patient and there are no changes to the H&P.   Brenda Floyd 04/06/2017 8:47 AM

## 2017-04-06 NOTE — Anesthesia Postprocedure Evaluation (Signed)
Anesthesia Post Note  Patient: Brenda Floyd  Procedure(s) Performed: CATARACT EXTRACTION PHACO AND INTRAOCULAR LENS PLACEMENT (IOC) RIGHT DIABETIC (Right Eye)  Patient location during evaluation: PACU Anesthesia Type: MAC Level of consciousness: awake and alert and oriented Pain management: satisfactory to patient Vital Signs Assessment: post-procedure vital signs reviewed and stable Respiratory status: spontaneous breathing, nonlabored ventilation and respiratory function stable Cardiovascular status: blood pressure returned to baseline and stable Postop Assessment: Adequate PO intake and No signs of nausea or vomiting Anesthetic complications: no    Raliegh Ip

## 2017-04-06 NOTE — Op Note (Signed)
OPERATIVE NOTE  Brenda Floyd [id number] 04/06/2017   PREOPERATIVE DIAGNOSIS:  Nuclear sclerotic cataract right eye.  H25.11   POSTOPERATIVE DIAGNOSIS:    Nuclear sclerotic cataract right eye.     PROCEDURE:  Phacoemusification with posterior chamber intraocular lens placement of the right eye   LENS:   Implant Name Type Inv. Item Serial No. Manufacturer Lot No. LRB No. Used  LENS IOL DIOP 22.0 - [id number] Intraocular Lens LENS IOL DIOP 22.0 [id number] AMO  Right 1       PCB00 +22.0   ULTRASOUND TIME: 0 minutes 55 seconds.  CDE 13.13   SURGEON:  Benay Pillow, MD, MPH  ANESTHESIOLOGIST: Anesthesiologist: Ronelle Nigh, MD CRNA: Janna Arch, CRNA   ANESTHESIA:  Topical with tetracaine drops augmented with 1% preservative-free intracameral lidocaine.  ESTIMATED BLOOD LOSS: less than 1 mL.   COMPLICATIONS:  None.   DESCRIPTION OF PROCEDURE:  The patient was identified in the holding room and transported to the operating room and placed in the supine position under the operating microscope.  The right eye was identified as the operative eye and it was prepped and draped in the usual sterile ophthalmic fashion.   A 1.0 millimeter clear-corneal paracentesis was made at the 10:30 position. 0.5 ml of preservative-free 1% lidocaine with epinephrine was injected into the anterior chamber.  The anterior chamber was filled with Healon 5 viscoelastic.  A 2.4 millimeter keratome was used to make a near-clear corneal incision at the 8:00 position.  A curvilinear capsulorrhexis was made with a cystotome and capsulorrhexis forceps.  Balanced salt solution was used to hydrodissect and hydrodelineate the nucleus.   Phacoemulsification was then used in stop and chop fashion to remove the lens nucleus and epinucleus.  The remaining cortex was then removed using the irrigation and aspiration handpiece. Healon was then placed into the capsular bag to distend it for lens placement.  A lens  was then injected into the capsular bag.  The remaining viscoelastic was aspirated.   Wounds were hydrated with balanced salt solution.  The anterior chamber was inflated to a physiologic pressure with balanced salt solution.   Intracameral vigamox 0.1 mL undiluted was injected into the eye and a drop placed onto the ocular surface.  No wound leaks were noted.  The patient was taken to the recovery room in stable condition without complications of anesthesia or surgery  Benay Pillow 04/06/2017, 9:19 AM

## 2017-04-06 NOTE — Anesthesia Procedure Notes (Signed)
Procedure Name: MAC Date/Time: 04/06/2017 8:54 AM Performed by: Janna Arch, CRNA Pre-anesthesia Checklist: Patient identified, Emergency Drugs available, Suction available and Patient being monitored Patient Re-evaluated:Patient Re-evaluated prior to induction Oxygen Delivery Method: Nasal cannula

## 2017-04-06 NOTE — Transfer of Care (Signed)
Immediate Anesthesia Transfer of Care Note  Patient: Brenda Floyd  Procedure(s) Performed: CATARACT EXTRACTION PHACO AND INTRAOCULAR LENS PLACEMENT (IOC) RIGHT DIABETIC (Right Eye)  Patient Location: PACU  Anesthesia Type: MAC  Level of Consciousness: awake, alert  and patient cooperative  Airway and Oxygen Therapy: Patient Spontanous Breathing and Patient connected to supplemental oxygen  Post-op Assessment: Post-op Vital signs reviewed, Patient's Cardiovascular Status Stable, Respiratory Function Stable, Patent Airway and No signs of Nausea or vomiting  Post-op Vital Signs: Reviewed and stable  Complications: No apparent anesthesia complications

## 2017-04-06 NOTE — Anesthesia Preprocedure Evaluation (Signed)
Anesthesia Evaluation  Patient identified by MRN, date of birth, ID band Patient awake    Reviewed: Allergy & Precautions, H&P , NPO status , Patient's Chart, lab work & pertinent test results  Airway Mallampati: II  TM Distance: >3 FB Neck ROM: full    Dental no notable dental hx.    Pulmonary shortness of breath, former smoker,    Pulmonary exam normal breath sounds clear to auscultation       Cardiovascular hypertension, Normal cardiovascular exam Rhythm:regular Rate:Normal     Neuro/Psych    GI/Hepatic   Endo/Other  diabetes, Type 1Morbid obesity  Renal/GU      Musculoskeletal   Abdominal   Peds  Hematology   Anesthesia Other Findings   Reproductive/Obstetrics                             Anesthesia Physical Anesthesia Plan  ASA: III  Anesthesia Plan: MAC   Post-op Pain Management:    Induction:   PONV Risk Score and Plan: 2 and Treatment may vary due to age or medical condition and Midazolam  Airway Management Planned:   Additional Equipment:   Intra-op Plan:   Post-operative Plan:   Informed Consent: I have reviewed the patients History and Physical, chart, labs and discussed the procedure including the risks, benefits and alternatives for the proposed anesthesia with the patient or authorized representative who has indicated his/her understanding and acceptance.     Plan Discussed with: CRNA  Anesthesia Plan Comments:         Anesthesia Quick Evaluation

## 2017-04-21 DIAGNOSIS — H2512 Age-related nuclear cataract, left eye: Secondary | ICD-10-CM | POA: Diagnosis not present

## 2017-04-29 NOTE — Discharge Instructions (Signed)

## 2017-04-30 ENCOUNTER — Ambulatory Visit: Payer: Medicare Other | Admitting: Internal Medicine

## 2017-05-04 ENCOUNTER — Ambulatory Visit: Payer: Medicare Other | Admitting: Anesthesiology

## 2017-05-04 ENCOUNTER — Encounter
Admission: RE | Disposition: A | Discharge status: Home / Self Care | Payer: Self-pay | Source: Ambulatory Visit | Attending: Ophthalmology

## 2017-05-04 ENCOUNTER — Ambulatory Visit
Admission: RE | Admit: 2017-05-04 | Discharge: 2017-05-04 | Disposition: A | Discharge status: Home / Self Care | Payer: Medicare Other | Source: Ambulatory Visit | Attending: Ophthalmology | Admitting: Ophthalmology

## 2017-05-04 DIAGNOSIS — Z96653 Presence of artificial knee joint, bilateral: Secondary | ICD-10-CM | POA: Insufficient documentation

## 2017-05-04 DIAGNOSIS — I10 Essential (primary) hypertension: Secondary | ICD-10-CM | POA: Insufficient documentation

## 2017-05-04 DIAGNOSIS — E119 Type 2 diabetes mellitus without complications: Secondary | ICD-10-CM | POA: Diagnosis not present

## 2017-05-04 DIAGNOSIS — Z87891 Personal history of nicotine dependence: Secondary | ICD-10-CM | POA: Insufficient documentation

## 2017-05-04 DIAGNOSIS — H25812 Combined forms of age-related cataract, left eye: Secondary | ICD-10-CM | POA: Diagnosis not present

## 2017-05-04 DIAGNOSIS — Z6841 Body Mass Index (BMI) 40.0 and over, adult: Secondary | ICD-10-CM | POA: Diagnosis not present

## 2017-05-04 DIAGNOSIS — F419 Anxiety disorder, unspecified: Secondary | ICD-10-CM | POA: Diagnosis not present

## 2017-05-04 DIAGNOSIS — M1990 Unspecified osteoarthritis, unspecified site: Secondary | ICD-10-CM | POA: Diagnosis not present

## 2017-05-04 DIAGNOSIS — H2512 Age-related nuclear cataract, left eye: Secondary | ICD-10-CM | POA: Diagnosis not present

## 2017-05-04 DIAGNOSIS — Z8719 Personal history of other diseases of the digestive system: Secondary | ICD-10-CM | POA: Diagnosis not present

## 2017-05-04 HISTORY — PX: CATARACT EXTRACTION W/PHACO: SHX586

## 2017-05-04 LAB — GLUCOSE, CAPILLARY: Glucose-Capillary: 118 mg/dL — ABNORMAL HIGH (ref 65–99)

## 2017-05-04 SURGERY — PHACOEMULSIFICATION, CATARACT, WITH IOL INSERTION
Anesthesia: Monitor Anesthesia Care | Site: Eye | Laterality: Left | Wound class: "Clean "

## 2017-05-04 MED ORDER — LIDOCAINE HCL (PF) 2 % IJ SOLN
INTRAOCULAR | Status: DC | PRN
  Administered 2017-05-04: 1 mL via INTRAOCULAR

## 2017-05-04 MED ORDER — ONDANSETRON HCL 4 MG/2ML IJ SOLN: 4.0000 mg | Freq: Once | INTRAMUSCULAR | Status: DC | PRN

## 2017-05-04 MED ORDER — SODIUM HYALURONATE 10 MG/ML IO SOLN
INTRAOCULAR | Status: DC | PRN
  Administered 2017-05-04: 0.55 mL via INTRAOCULAR

## 2017-05-04 MED ORDER — LACTATED RINGERS IV SOLN: INTRAVENOUS | Status: DC

## 2017-05-04 MED ORDER — SODIUM HYALURONATE 23 MG/ML IO SOLN
INTRAOCULAR | Status: DC | PRN
  Administered 2017-05-04: 0.6 mL via INTRAOCULAR

## 2017-05-04 MED ORDER — FENTANYL CITRATE (PF) 100 MCG/2ML IJ SOLN
INTRAMUSCULAR | Status: DC | PRN
  Administered 2017-05-04: 50 ug via INTRAVENOUS

## 2017-05-04 MED ORDER — EPINEPHRINE PF 1 MG/ML IJ SOLN
INTRAOCULAR | Status: DC | PRN
  Administered 2017-05-04: 69 mL via OPHTHALMIC

## 2017-05-04 MED ORDER — MOXIFLOXACIN HCL 0.5 % OP SOLN
OPHTHALMIC | Status: DC | PRN
  Administered 2017-05-04: 0.2 mL via OPHTHALMIC

## 2017-05-04 MED ORDER — ACETAMINOPHEN 325 MG PO TABS: 650.0000 mg | ORAL_TABLET | Freq: Once | ORAL | Status: DC | PRN

## 2017-05-04 MED ORDER — ARMC OPHTHALMIC DILATING DROPS
1.0000 "application " | OPHTHALMIC | Status: DC | PRN
  Administered 2017-05-04 (×3): 1 via OPHTHALMIC

## 2017-05-04 MED ORDER — MIDAZOLAM HCL 2 MG/2ML IJ SOLN
INTRAMUSCULAR | Status: DC | PRN
  Administered 2017-05-04: 2 mg via INTRAVENOUS

## 2017-05-04 MED ORDER — ACETAMINOPHEN 160 MG/5ML PO SOLN: 325.0000 mg | ORAL | Status: DC | PRN

## 2017-05-04 SURGICAL SUPPLY — 17 items
CANNULA ANT/CHMB 27G (MISCELLANEOUS) ×1 IMPLANT
CANNULA ANT/CHMB 27GA (MISCELLANEOUS) ×3 IMPLANT
DISSECTOR HYDRO NUCLEUS 50X22 (MISCELLANEOUS) ×3 IMPLANT
GLOVE BIO SURGEON STRL SZ8 (GLOVE) ×3 IMPLANT
GLOVE SURG LX 7.5 STRW (GLOVE) ×2
GLOVE SURG LX STRL 7.5 STRW (GLOVE) ×1 IMPLANT
GOWN STRL REUS W/ TWL LRG LVL3 (GOWN DISPOSABLE) ×2 IMPLANT
GOWN STRL REUS W/TWL LRG LVL3 (GOWN DISPOSABLE) ×4
LENS IOL TECNIS ITEC 22.0 (Intraocular Lens) ×2 IMPLANT
MARKER SKIN DUAL TIP RULER LAB (MISCELLANEOUS) ×3 IMPLANT
PACK CATARACT (MISCELLANEOUS) ×3 IMPLANT
PACK DR. KING ARMS (PACKS) ×3 IMPLANT
PACK EYE AFTER SURG (MISCELLANEOUS) ×3 IMPLANT
SYR 3ML LL SCALE MARK (SYRINGE) ×3 IMPLANT
SYR TB 1ML LUER SLIP (SYRINGE) ×3 IMPLANT
WATER STERILE IRR 500ML POUR (IV SOLUTION) ×3 IMPLANT
WIPE NON LINTING 3.25X3.25 (MISCELLANEOUS) ×3 IMPLANT

## 2017-05-04 NOTE — Anesthesia Postprocedure Evaluation (Signed)
Anesthesia Post Note  Patient: Brenda Floyd  Procedure(s) Performed: CATARACT EXTRACTION PHACO AND INTRAOCULAR LENS PLACEMENT (IOC) LEFT DIABETIC (Left Eye)  Patient location during evaluation: PACU Anesthesia Type: MAC Level of consciousness: awake and alert, oriented and patient cooperative Pain management: pain level controlled Vital Signs Assessment: post-procedure vital signs reviewed and stable Respiratory status: spontaneous breathing, nonlabored ventilation and respiratory function stable Cardiovascular status: blood pressure returned to baseline and stable Postop Assessment: adequate PO intake Anesthetic complications: no    Darrin Nipper

## 2017-05-04 NOTE — Anesthesia Procedure Notes (Signed)
Procedure Name: MAC Performed by: Jakyiah Briones, CRNA Pre-anesthesia Checklist: Patient identified, Emergency Drugs available, Suction available, Timeout performed and Patient being monitored Patient Re-evaluated:Patient Re-evaluated prior to induction Oxygen Delivery Method: Nasal cannula Placement Confirmation: positive ETCO2       

## 2017-05-04 NOTE — Op Note (Signed)
OPERATIVE NOTE  Brenda Floyd [id number] 05/04/2017   PREOPERATIVE DIAGNOSIS:  Nuclear sclerotic cataract left eye.  H25.12   POSTOPERATIVE DIAGNOSIS:    Nuclear sclerotic cataract left eye.     PROCEDURE:  Phacoemusification with posterior chamber intraocular lens placement of the left eye   LENS:   Implant Name Type Inv. Item Serial No. Manufacturer Lot No. LRB No. Used  LENS IOL DIOP 22.0 - [id number] Intraocular Lens LENS IOL DIOP 22.0 [id number] AMO  Left 1       PCB00 +22.0   ULTRASOUND TIME: 0 minutes 36 seconds.  CDE 6.14   SURGEON:  Benay Pillow, MD, MPH   ANESTHESIA:  Topical with tetracaine drops augmented with 1% preservative-free intracameral lidocaine.  ESTIMATED BLOOD LOSS: <1 mL   COMPLICATIONS:  None.   DESCRIPTION OF PROCEDURE:  The patient was identified in the holding room and transported to the operating room and placed in the supine position under the operating microscope.  The left eye was identified as the operative eye and it was prepped and draped in the usual sterile ophthalmic fashion.   A 1.0 millimeter clear-corneal paracentesis was made at the 5:00 position. 0.5 ml of preservative-free 1% lidocaine with epinephrine was injected into the anterior chamber.  The anterior chamber was filled with Healon 5 viscoelastic.  A 2.4 millimeter keratome was used to make a near-clear corneal incision at the 2:00 position.  A curvilinear capsulorrhexis was made with a cystotome and capsulorrhexis forceps.  Balanced salt solution was used to hydrodissect and hydrodelineate the nucleus.   Phacoemulsification was then used in stop and chop fashion to remove the lens nucleus and epinucleus.  The remaining cortex was then removed using the irrigation and aspiration handpiece. Healon was then placed into the capsular bag to distend it for lens placement.  A lens was then injected into the capsular bag.  The remaining viscoelastic was aspirated.   Wounds were  hydrated with balanced salt solution.  The anterior chamber was inflated to a physiologic pressure with balanced salt solution.  Intracameral vigamox 0.1 mL undiltued was injected into the eye and a drop placed onto the ocular surface.  No wound leaks were noted.  The patient was taken to the recovery room in stable condition without complications of anesthesia or surgery  Benay Pillow 05/04/2017, 9:14 AM

## 2017-05-04 NOTE — H&P (Signed)
The History and Physical notes are on paper, have been signed, and are to be scanned.   I have examined the patient and there are no changes to the H&P.   Brenda Floyd 05/04/2017 8:41 AM

## 2017-05-04 NOTE — Transfer of Care (Signed)
Immediate Anesthesia Transfer of Care Note  Patient: Brenda Floyd  Procedure(s) Performed: CATARACT EXTRACTION PHACO AND INTRAOCULAR LENS PLACEMENT (IOC) LEFT DIABETIC (Left Eye)  Patient Location: PACU  Anesthesia Type: MAC  Level of Consciousness: awake, alert  and patient cooperative  Airway and Oxygen Therapy: Patient Spontanous Breathing and Patient connected to supplemental oxygen  Post-op Assessment: Post-op Vital signs reviewed, Patient's Cardiovascular Status Stable, Respiratory Function Stable, Patent Airway and No signs of Nausea or vomiting  Post-op Vital Signs: Reviewed and stable  Complications: No apparent anesthesia complications

## 2017-05-04 NOTE — Anesthesia Preprocedure Evaluation (Signed)
Anesthesia Evaluation  Patient identified by MRN, date of birth, ID band Patient awake    Reviewed: Allergy & Precautions, NPO status , Patient's Chart, lab work & pertinent test results  History of Anesthesia Complications Negative for: history of anesthetic complications  Airway Mallampati: III  TM Distance: >3 FB Neck ROM: Full    Dental no notable dental hx.    Pulmonary former smoker (quit 1992),    Pulmonary exam normal breath sounds clear to auscultation       Cardiovascular Exercise Tolerance: Good hypertension, Normal cardiovascular exam Rhythm:Regular Rate:Normal     Neuro/Psych PSYCHIATRIC DISORDERS Anxiety negative neurological ROS     GI/Hepatic Diverticulosis    Endo/Other  diabetesHypothyroidism Morbid obesity  Renal/GU      Musculoskeletal   Abdominal   Peds  Hematology   Anesthesia Other Findings   Reproductive/Obstetrics                             Anesthesia Physical Anesthesia Plan  ASA: II  Anesthesia Plan: MAC   Post-op Pain Management:    Induction: Intravenous  PONV Risk Score and Plan: 2 and TIVA and Midazolam  Airway Management Planned: Natural Airway  Additional Equipment:   Intra-op Plan:   Post-operative Plan:   Informed Consent: I have reviewed the patients History and Physical, chart, labs and discussed the procedure including the risks, benefits and alternatives for the proposed anesthesia with the patient or authorized representative who has indicated his/her understanding and acceptance.     Plan Discussed with: CRNA  Anesthesia Plan Comments:         Anesthesia Quick Evaluation

## 2017-05-05 ENCOUNTER — Ambulatory Visit (INDEPENDENT_AMBULATORY_CARE_PROVIDER_SITE_OTHER): Payer: Medicare Other | Admitting: Internal Medicine

## 2017-05-05 ENCOUNTER — Encounter: Payer: Self-pay | Admitting: Ophthalmology

## 2017-05-05 VITALS — BP 116/82 | HR 85 | Temp 98.1°F | Resp 15 | Ht 64.0 in | Wt 272.4 lb

## 2017-05-05 DIAGNOSIS — R7401 Elevation of levels of liver transaminase levels: Secondary | ICD-10-CM

## 2017-05-05 DIAGNOSIS — Z23 Encounter for immunization: Secondary | ICD-10-CM | POA: Diagnosis not present

## 2017-05-05 DIAGNOSIS — R740 Nonspecific elevation of levels of transaminase and lactic acid dehydrogenase [LDH]: Secondary | ICD-10-CM

## 2017-05-05 DIAGNOSIS — E034 Atrophy of thyroid (acquired): Secondary | ICD-10-CM

## 2017-05-05 DIAGNOSIS — E1169 Type 2 diabetes mellitus with other specified complication: Secondary | ICD-10-CM

## 2017-05-05 DIAGNOSIS — G8929 Other chronic pain: Secondary | ICD-10-CM | POA: Diagnosis not present

## 2017-05-05 DIAGNOSIS — E785 Hyperlipidemia, unspecified: Secondary | ICD-10-CM

## 2017-05-05 DIAGNOSIS — M791 Myalgia, unspecified site: Secondary | ICD-10-CM | POA: Diagnosis not present

## 2017-05-05 DIAGNOSIS — Z9842 Cataract extraction status, left eye: Secondary | ICD-10-CM

## 2017-05-05 DIAGNOSIS — T466X5A Adverse effect of antihyperlipidemic and antiarteriosclerotic drugs, initial encounter: Secondary | ICD-10-CM

## 2017-05-05 DIAGNOSIS — E669 Obesity, unspecified: Secondary | ICD-10-CM | POA: Diagnosis not present

## 2017-05-05 DIAGNOSIS — Z9841 Cataract extraction status, right eye: Secondary | ICD-10-CM

## 2017-05-05 DIAGNOSIS — M25511 Pain in right shoulder: Secondary | ICD-10-CM

## 2017-05-05 LAB — POCT GLYCOSYLATED HEMOGLOBIN (HGB A1C): Hemoglobin A1C: 5.6

## 2017-05-05 MED ORDER — CELECOXIB 200 MG PO CAPS: 200.0000 mg | ORAL_CAPSULE | Freq: Two times a day (BID) | ORAL | 2 refills | Status: DC

## 2017-05-05 NOTE — Progress Notes (Signed)
Subjective:  Patient ID: Brenda Floyd, female    DOB: 1940-09-03  Age: 77 y.o. MRN: [id number]  CC: The primary encounter diagnosis was Diabetes mellitus type 2 in obese ([place]). Diagnoses of ALT (SGPT) level raised, Chronic right shoulder pain, Hypothyroidism due to acquired atrophy of thyroid, Hyperlipidemia with target LDL less than 70, Myalgia due to statin, Morbid obesity ([place]), and S/P bilateral cataract extraction were also pertinent to this visit.  HPI Brenda Floyd presents for 3 month follow up on diabetes.  Patient has no complaints today.  Patient is following a low glycemic index diet and taking all prescribed medications regularly without side effects.  She has  been noticing the the freestyle Libre monitor  underestimates CBGS by 20  To  30 points  compared to traditional glucometers  And does not alarm  CBG Is  < 80.   Fasting sugars have been under 120 most of the time and post prandials have been under 140 except on rare occasions. Patient is not exercising dur to polyarthralgias  But is physically active on her small far.  Not trying to lose weight .  Patient has had an eye exam in the last 12 months and checks feet regularly for signs of infection.  Patient does not walk barefoot outside,  And denies any numbness tingling or burning in feet. Patient is up to date on all recommended vaccinations  Right cataract removed feb 26,  Left march 26 vision 20/200,  Pre surgery now 20/25 right eye .Marland Kitchen    Not sleeping well due to bilateral shoulder pain .  Aggravated by farm chores, driving.  Last shoulder injection was a year ago. Relief Lasted 6 weeks .  Has been using ibuprofen at night    Lab Results  Component Value Date   HGBA1C 5.6 05/05/2017   akbash dogs   Outpatient Medications Prior to Visit  Medication Sig Dispense Refill  . aspirin 81 MG tablet Take 1 tablet (81 mg total) by mouth daily. 30 tablet 11  . Blood Glucose Monitoring Suppl DEVI Freestyle Libre monitor  1 each 0  . Cholecalciferol 50000 units capsule Take 50,000 Units by mouth once a week.    . co-enzyme Q-10 30 MG capsule Take 1 capsule (30 mg total) by mouth daily. 90 capsule 3  . Continuous Blood Gluc Receiver (FREESTYLE LIBRE 14 DAY READER) DEVI 1 Units every 8 (eight) hours by Does not apply route. 1 Device 0  . Continuous Blood Gluc Sensor (FREESTYLE LIBRE SENSOR SYSTEM) MISC Use with freestyle reader 2 each 11  . Cyanocobalamin (VITAMIN B-12 IJ) Inject 1,000 mcg as directed every 30 (thirty) days.    . diclofenac sodium (VOLTAREN) 1 % GEL Apply 2 g topically 4 (four) times daily. 100 g 3  . fexofenadine (ALLEGRA) 180 MG tablet Take 180 mg by mouth daily.    Marland Kitchen glucose blood (FREESTYLE LITE) test strip Use to check blood sugars once daily,  Twice if needed   DM 250.00 102 each 3  . INS SYRINGE/NEEDLE 1CC/28G (B-D INSULIN SYRINGE 1CC/28G) 28G X 1/2" 1 ML MISC 1 Syringe by Does not apply route daily after supper. For use with insulin 10 each 0  . Insulin Glargine (LANTUS SOLOSTAR) 100 UNIT/ML Solostar Pen INJECT 22 UNITS UNDER THE SKIN DAILY (Patient taking differently: 20 Units. INJECT 22 UNITS UNDER THE SKIN DAILY) 15 mL 6  . insulin regular (HUMULIN R) 100 units/mL injection Inject 0.1 mLs (10 Units total) into the skin  3 (three) times daily before meals. For BS > 200 10 mL 0  . levothyroxine (SYNTHROID, LEVOTHROID) 75 MCG tablet TAKE 1 TABLET DAILY FOR HYPOTHYROIDISM 90 tablet 0  . lisinopril (PRINIVIL,ZESTRIL) 20 MG tablet TAKE 1 TABLET BY MOUTH ONCE DAILY 90 tablet 1  . magnesium gluconate (MAGONATE) 500 MG tablet Take 500 mg by mouth daily.    . montelukast (SINGULAIR) 10 MG tablet Take 1 tablet (10 mg total) by mouth at bedtime. 90 tablet 3  . PARoxetine (PAXIL) 20 MG tablet TAKE 1 TABLET DAILY 90 tablet 2  . Syringe/Needle, Disp, (SYRINGE 3CC/25GX1") 25G X 1" 3 ML MISC Use as directed 50 each 0  . TURMERIC PO Take by mouth daily.    Marland Kitchen amoxicillin (AMOXIL) 500 MG capsule TAKE 4  CAPSULES BY MOUTH ONE HOUR BEFORE APPOINTMENT (Patient not taking: Reported on 09/17/2016) 4 capsule 0  . vitamin B-12 (CYANOCOBALAMIN) 1000 MCG tablet Take 1,000 mcg daily by mouth.     No facility-administered medications prior to visit.     Review of Systems;  Patient denies headache, fevers, malaise, unintentional weight loss, skin rash, eye pain, sinus congestion and sinus pain, sore throat, dysphagia,  hemoptysis , cough, dyspnea, wheezing, chest pain, palpitations, orthopnea, edema, abdominal pain, nausea, melena, diarrhea, constipation, flank pain, dysuria, hematuria, urinary  Frequency, nocturia, numbness, tingling, seizures,  Focal weakness, Loss of consciousness,  Tremor, insomnia, depression, anxiety, and suicidal ideation.      Objective:  BP 116/82 (BP Location: Left Arm, Patient Position: Sitting, Cuff Size: Large)   Pulse 85   Temp 98.1 F (36.7 C) (Oral)   Resp 15   Ht 5\' 4"  (1.626 m)   Wt 272 lb 6.4 oz (123.6 kg)   SpO2 96%   BMI 46.76 kg/m   BP Readings from Last 3 Encounters:  05/05/17 116/82  05/04/17 (!) 138/101  04/06/17 (!) 160/82    Wt Readings from Last 3 Encounters:  05/05/17 272 lb 6.4 oz (123.6 kg)  05/04/17 271 lb (122.9 kg)  04/06/17 270 lb (122.5 kg)    General appearance: alert, cooperative and appears stated age Ears: normal TM's and external ear canals both ears Throat: lips, mucosa, and tongue normal; teeth and gums normal Neck: no adenopathy, no carotid bruit, supple, symmetrical, trachea midline and thyroid not enlarged, symmetric, no tenderness/mass/nodules Back: symmetric, no curvature. ROM normal. No CVA tenderness. Lungs: clear to auscultation bilaterally Heart: regular rate and rhythm, S1, S2 normal, no murmur, click, rub or gallop Abdomen: soft, non-tender; bowel sounds normal; no masses,  no organomegaly Pulses: 2+ and symmetric Skin: Skin color, texture, turgor normal. No rashes or lesions MSK: right shoulder with AC an d  sbacromia; tenderness   ROM limited to 180 degrees of abduction  Lymph nodes: Cervical, supraclavicular, and axillary nodes normal.  Lab Results  Component Value Date   HGBA1C 5.6 05/05/2017   HGBA1C 6.0 12/21/2016   HGBA1C 5.9 06/11/2016    Lab Results  Component Value Date   CREATININE 0.83 12/21/2016   CREATININE 0.96 09/17/2016   CREATININE 0.86 06/11/2016    Lab Results  Component Value Date   WBC 5.3 08/08/2012   HGB 13.5 08/08/2012   HCT 41.2 08/08/2012   PLT 216.0 08/08/2012   GLUCOSE 102 (H) 12/21/2016   CHOL 269 (H) 06/11/2016   TRIG 284.0 (H) 06/11/2016   HDL 58.30 06/11/2016   LDLDIRECT 186.0 12/21/2016   LDLCALC 167 (H) 05/26/2013   ALT 20 12/21/2016   AST 15 12/21/2016  NA 138 12/21/2016   K 5.1 12/21/2016   CL 103 12/21/2016   CREATININE 0.83 12/21/2016   BUN 17 12/21/2016   CO2 27 12/21/2016   TSH 2.61 12/21/2016   HGBA1C 5.6 05/05/2017   MICROALBUR <0.7 12/21/2016    No results found.  Assessment & Plan:   Problem List Items Addressed This Visit    S/P bilateral cataract extraction    Uncomplicated,  With improvement in right eye vision from 20/200 to 20/25      Myalgia due to statin    She has had two prior trials of statin therapy with Lipitor and Crestor.       Morbid obesity ([place])    Body mass index is 46.76 kg/m. I have addressed  BMI and recommended wt loss of 10% of body weight over the next 6 months using a low glycemic index diet and regular exercise a minimum of 5 days per week.        Hypothyroidism due to acquired atrophy of thyroid    Thyroid function is WNL on current dose.  No current changes needed.   Lab Results  Component Value Date   TSH 2.61 12/21/2016         Hyperlipidemia with target LDL less than 70    Managed with red yeast rice due to statin intolerance . Based on current lipid profile, the risk of clinically significant CAD is 22% over the next 10 years, using the Framingham risk calculator. The  [employer] recommends starting patients aged 14 or higher on moderate intensity statin therapy for LDL between 70-189 and 10 yr risk of CAD > 7.5% ;  and high intensity therapy for anyone with LDL > 190. . Does not want to resume lipitor due to concern about potential side effects .  Taking an aspirin.   Lab Results  Component Value Date   CHOL 269 (H) 06/11/2016   HDL 58.30 06/11/2016   LDLCALC 167 (H) 05/26/2013   LDLDIRECT 186.0 12/21/2016   TRIG 284.0 (H) 06/11/2016   CHOLHDL 5 06/11/2016         Diabetes mellitus type 2 in obese ([place]) - Primary     well-controlled on 22 units of Lantuss and metformin.   hemoglobin AC has been consistently at or  less than 6.0 . Patient is using the freestyle [place] system to monitor blood sugars but has gained weight due to lack of exercise.  She is not novitiate to exercise due to bilateral knee and ankle pain and now right shoulder pain t. she is up-to-date on eye exams and foot exam is normal today except for pes planus. . Patient has no evidence of  Microalbuminuria.  Patient is tolerating aspirin and red yeast rice  for CAD risk reduction and is already on on ACE/ARB .\  Lab Results  Component Value Date   HGBA1C 5.6 05/05/2017   Lab Results  Component Value Date   MICROALBUR <0.7 12/21/2016         Relevant Orders   POCT HgB A1C (Completed)   Chronic right shoulder pain    With prior I/A steroid injection providing limited relief.  Plain films were normal August 2018. May be rotator cuff syndrome from overuse.  Trial of celebrex/omeprazole      Relevant Medications   celecoxib (CELEBREX) 200 MG capsule   RESOLVED: ALT (SGPT) level raised    Normalized on repeat check   Lab Results  Component Value Date   ALT 20  12/21/2016   AST 15 12/21/2016   ALKPHOS 64 12/21/2016   BILITOT 0.5 12/21/2016           A total of 25 minutes of face to face time was spent with patient more than half of which was spent in  counselling about the above mentioned conditions  and coordination of care   I have discontinued Dickie La. Moradi's vitamin B-12. I am also having her start on celecoxib. Additionally, I am having her maintain her aspirin, glucose blood, co-enzyme Q-10, TURMERIC PO, SYRINGE 3CC/25GX1", fexofenadine, magnesium gluconate, amoxicillin, montelukast, insulin regular, INS SYRINGE/NEEDLE 1CC/28G, Blood Glucose Monitoring Suppl, Insulin Glargine, Cholecalciferol, diclofenac sodium, FREESTYLE LIBRE 14 DAY READER, FREESTYLE LIBRE SENSOR SYSTEM, lisinopril, PARoxetine, levothyroxine, and Cyanocobalamin (VITAMIN B-12 IJ).  Meds ordered this encounter  Medications  . celecoxib (CELEBREX) 200 MG capsule    Sig: Take 1 capsule (200 mg total) by mouth 2 (two) times daily. As needed for joint pain    Dispense:  60 capsule    Refill:  2    Medications Discontinued During This Encounter  Medication Reason  . vitamin B-12 (CYANOCOBALAMIN) 1000 MCG tablet Patient has not taken in last 30 days    Follow-up: No follow-ups on file.   Crecencio Mc, MD

## 2017-05-05 NOTE — Patient Instructions (Addendum)
I have prescribed celebrex  To use up to 2 times daily if needed for shoulder pain .  Do not combine with ibuprofen,  But you can use tylenol up to  2000 mg daily   If you use it daily,  Protect your stomach with omeprazole  We will check your varicella titer next time

## 2017-05-08 DIAGNOSIS — Z9841 Cataract extraction status, right eye: Secondary | ICD-10-CM | POA: Insufficient documentation

## 2017-05-08 DIAGNOSIS — T466X5A Adverse effect of antihyperlipidemic and antiarteriosclerotic drugs, initial encounter: Secondary | ICD-10-CM | POA: Insufficient documentation

## 2017-05-08 DIAGNOSIS — Z9842 Cataract extraction status, left eye: Secondary | ICD-10-CM

## 2017-05-08 DIAGNOSIS — M791 Myalgia, unspecified site: Secondary | ICD-10-CM

## 2017-05-08 NOTE — Assessment & Plan Note (Addendum)
With prior I/A steroid injection providing limited relief.  Plain films were normal August 2018. May be rotator cuff syndrome from overuse.  Trial of celebrex/omeprazole

## 2017-05-08 NOTE — Assessment & Plan Note (Signed)
Uncomplicated,  With improvement in right eye vision from 20/200 to 20/25

## 2017-05-08 NOTE — Assessment & Plan Note (Signed)
Thyroid function is WNL on current dose.  No current changes needed.   Lab Results  Component Value Date   TSH 2.61 12/21/2016

## 2017-05-08 NOTE — Assessment & Plan Note (Signed)
Managed with red yeast rice due to statin intolerance . Based on current lipid profile, the risk of clinically significant CAD is 22% over the next 10 years, using the Framingham risk calculator. The [employer] recommends starting patients aged 79 or higher on moderate intensity statin therapy for LDL between 70-189 and 10 yr risk of CAD > 7.5% ;  and high intensity therapy for anyone with LDL > 190. . Does not want to resume lipitor due to concern about potential side effects .  Taking an aspirin.   Lab Results  Component Value Date   CHOL 269 (H) 06/11/2016   HDL 58.30 06/11/2016   LDLCALC 167 (H) 05/26/2013   LDLDIRECT 186.0 12/21/2016   TRIG 284.0 (H) 06/11/2016   CHOLHDL 5 06/11/2016

## 2017-05-08 NOTE — Assessment & Plan Note (Signed)
She has had two prior trials of statin therapy with Lipitor and Crestor.

## 2017-05-08 NOTE — Assessment & Plan Note (Addendum)
well-controlled on 22 units of Lantuss and metformin.   hemoglobin AC has been consistently at or  less than 6.0 . Patient is using the freestyle [place] system to monitor blood sugars but has gained weight due to lack of exercise.  She is not novitiate to exercise due to bilateral knee and ankle pain and now right shoulder pain t. she is up-to-date on eye exams and foot exam is normal today except for pes planus. . Patient has no evidence of  Microalbuminuria.  Patient is tolerating aspirin and red yeast rice  for CAD risk reduction and is already on on ACE/ARB .\  Lab Results  Component Value Date   HGBA1C 5.6 05/05/2017   Lab Results  Component Value Date   MICROALBUR <0.7 12/21/2016

## 2017-05-08 NOTE — Assessment & Plan Note (Signed)
Normalized on repeat check   Lab Results  Component Value Date   ALT 20 12/21/2016   AST 15 12/21/2016   ALKPHOS 64 12/21/2016   BILITOT 0.5 12/21/2016

## 2017-05-08 NOTE — Assessment & Plan Note (Addendum)
Body mass index is 46.76 kg/m. I have addressed  BMI and recommended wt loss of 10% of body weight over the next 6 months using a low glycemic index diet and regular exercise a minimum of 5 days per week.

## 2017-05-29 ENCOUNTER — Other Ambulatory Visit: Payer: Self-pay | Admitting: Internal Medicine

## 2017-05-31 ENCOUNTER — Other Ambulatory Visit: Payer: Self-pay | Admitting: Internal Medicine

## 2017-07-30 ENCOUNTER — Other Ambulatory Visit: Payer: Self-pay | Admitting: Internal Medicine

## 2017-08-16 ENCOUNTER — Ambulatory Visit (INDEPENDENT_AMBULATORY_CARE_PROVIDER_SITE_OTHER): Payer: Medicare Other

## 2017-08-16 VITALS — BP 116/62 | HR 90 | Temp 98.1°F | Resp 16 | Ht 64.5 in | Wt 276.0 lb

## 2017-08-16 DIAGNOSIS — Z0000 Encounter for general adult medical examination without abnormal findings: Secondary | ICD-10-CM | POA: Diagnosis not present

## 2017-08-16 NOTE — Progress Notes (Addendum)
Subjective:   Brenda Floyd is a 77 y.o. female who presents for Medicare Annual (Subsequent) preventive examination.  Review of Systems:  No ROS.  Medicare Wellness Visit. Additional risk factors are reflected in the social history.  Cardiac Risk Factors include: advanced age (>1men, >32 women);hypertension;obesity (BMI >30kg/m2);diabetes mellitus     Objective:     Vitals: BP 116/62 (BP Location: Right Arm, Patient Position: Sitting, Cuff Size: Normal)   Pulse 90   Temp 98.1 F (36.7 C) (Oral)   Resp 16   Ht 5' 4.5" (1.638 m)   Wt 276 lb (125.2 kg)   SpO2 98%   BMI 46.64 kg/m   Body mass index is 46.64 kg/m.  Advanced Directives 08/16/2017 05/04/2017 04/06/2017 01/14/2016 12/18/2015 05/30/2015  Does Patient Have a Medical Advance Directive? Yes No No No No No  Type of Advance Directive [name]  Does patient want to make changes to medical advance directive? No - Patient declined - - - - -  Copy of [place] in Chart? No - copy requested - - - - -  Would patient like information on creating a medical advance directive? - No - Patient declined No - Patient declined No - Patient declined No - patient declined information No - patient declined information    Tobacco Social History   Tobacco Use  Smoking Status Former Smoker  . Last attempt to quit: 10/30/1990  . Years since quitting: 26.8  Smokeless Tobacco Never Used     Counseling given: Not Answered   Clinical Intake:  Pre-visit preparation completed: Yes  Pain : 0-10 Pain Score: 5  Pain Type: Chronic pain Pain Location: Shoulder Pain Orientation: Left Pain Onset: More than a month ago(chronic) Pain Frequency: Constant Pain Relieving Factors: Celebrex.  Effect of Pain on Daily Activities: Limited ROM. Self pace.   Pain Relieving Factors: Celebrex.   Nutritional Status: BMI > 30  Obese Diabetes: Yes(Followed by pcp. )  How often do you need to have  someone help you when you read instructions, pamphlets, or other written materials from your doctor or pharmacy?: 1 - Never  Interpreter Needed?: No     Past Medical History:  Diagnosis Date  . Arthritis    neck, knees, left shoulder  . Cancer ([place])    skin ca  . Colon polyps 2012   [place], followup due 2015  . Diabetes mellitus   . Diverticulosis of sigmoid colon July 2012   by colonoscopy  . Dyspnea   . Fracture closed, femur, shaft ([place]) remote   s/p screws and rod repair  . Hypertension   . Thyroid disease    Past Surgical History:  Procedure Laterality Date  . ABDOMINAL HYSTERECTOMY    . BREAST CYST ASPIRATION Left    neg  . CATARACT EXTRACTION W/PHACO Right 04/06/2017   Procedure: CATARACT EXTRACTION PHACO AND INTRAOCULAR LENS PLACEMENT ([place]) RIGHT DIABETIC;  Surgeon: Eulogio Bear, MD;  Location: [facility];  Service: Ophthalmology;  Laterality: Right;  . CATARACT EXTRACTION W/PHACO Left 05/04/2017   Procedure: CATARACT EXTRACTION PHACO AND INTRAOCULAR LENS PLACEMENT ([place]) LEFT DIABETIC;  Surgeon: Eulogio Bear, MD;  Location: [facility];  Service: Ophthalmology;  Laterality: Left;  DIABETIC  . JOINT REPLACEMENT     bilateral knee, 2011   Family History  Problem Relation Age of Onset  . Cancer Mother        breast cancer and colon ca  .  Breast cancer Mother 64  . Cancer Father        leukemia  . Diabetes Sister   . Hypertension Sister   . Breast cancer Sister 75  . Breast cancer Maternal Aunt   . Breast cancer Cousin        maternal 1st cousin   Social History   Socioeconomic History  . Marital status: Single    Spouse name: Not on file  . Number of children: Not on file  . Years of education: Not on file  . Highest education level: Not on file  Occupational History  . Not on file  Social Needs  . Financial resource strain: Not hard at all  . Food insecurity:    Worry: Never true    Inability: Never true  .  Transportation needs:    Medical: No    Non-medical: No  Tobacco Use  . Smoking status: Former Smoker    Last attempt to quit: 10/30/1990    Years since quitting: 26.8  . Smokeless tobacco: Never Used  Substance and Sexual Activity  . Alcohol use: No  . Drug use: No  . Sexual activity: Never  Lifestyle  . Physical activity:    Days per week: Not on file    Minutes per session: Not on file  . Stress: Not on file  Relationships  . Social connections:    Talks on phone: Not on file    Gets together: Not on file    Attends religious service: Not on file    Active member of club or organization: Not on file    Attends meetings of clubs or organizations: Not on file    Relationship status: Not on file  Other Topics Concern  . Not on file  Social History Narrative  . Not on file    Outpatient Encounter Medications as of 08/16/2017  Medication Sig  . amoxicillin (AMOXIL) 500 MG capsule TAKE 4 CAPSULES BY MOUTH ONE HOUR BEFORE APPOINTMENT  . aspirin 81 MG tablet Take 1 tablet (81 mg total) by mouth daily.  . Blood Glucose Monitoring Suppl DEVI Freestyle Libre monitor  . celecoxib (CELEBREX) 200 MG capsule Take 1 capsule (200 mg total) by mouth 2 (two) times daily. As needed for joint pain  . Cholecalciferol 50000 units capsule Take 50,000 Units by mouth once a week.  . co-enzyme Q-10 30 MG capsule Take 1 capsule (30 mg total) by mouth daily.  . Continuous Blood Gluc Receiver (FREESTYLE LIBRE 14 DAY READER) DEVI 1 Units every 8 (eight) hours by Does not apply route.  . Continuous Blood Gluc Sensor (FREESTYLE LIBRE SENSOR SYSTEM) MISC Use with freestyle reader  . Cyanocobalamin (VITAMIN B-12 IJ) Inject 1,000 mcg as directed every 30 (thirty) days.  . fexofenadine (ALLEGRA) 180 MG tablet Take 180 mg by mouth daily.  Marland Kitchen glucose blood (FREESTYLE LITE) test strip Use to check blood sugars once daily,  Twice if needed   DM 250.00  . Insulin Glargine (LANTUS SOLOSTAR) 100 UNIT/ML Solostar Pen  INJECT 22 UNITS UNDER THE SKIN DAILY  . levothyroxine (SYNTHROID, LEVOTHROID) 75 MCG tablet TAKE 1 TABLET DAILY FOR HYPOTHYROIDISM  . lisinopril (PRINIVIL,ZESTRIL) 20 MG tablet TAKE 1 TABLET BY MOUTH ONCE DAILY  . magnesium gluconate (MAGONATE) 500 MG tablet Take 500 mg by mouth daily.  Marland Kitchen PARoxetine (PAXIL) 20 MG tablet TAKE 1 TABLET DAILY  . Syringe/Needle, Disp, (SYRINGE 3CC/25GX1") 25G X 1" 3 ML MISC Use as directed  . TURMERIC PO Take by  mouth daily.  . [DISCONTINUED] diclofenac sodium (VOLTAREN) 1 % GEL Apply 2 g topically 4 (four) times daily.  . [DISCONTINUED] INS SYRINGE/NEEDLE 1CC/28G (B-D INSULIN SYRINGE 1CC/28G) 28G X 1/2" 1 ML MISC 1 Syringe by Does not apply route daily after supper. For use with insulin  . [DISCONTINUED] insulin regular (HUMULIN R) 100 units/mL injection Inject 0.1 mLs (10 Units total) into the skin 3 (three) times daily before meals. For BS > 200  . [DISCONTINUED] montelukast (SINGULAIR) 10 MG tablet Take 1 tablet (10 mg total) by mouth at bedtime.   No facility-administered encounter medications on file as of 08/16/2017.     Activities of Daily Living In your present state of health, do you have any difficulty performing the following activities: 08/16/2017 05/04/2017  Hearing? N N  Vision? N N  Difficulty concentrating or making decisions? N N  Walking or climbing stairs? N N  Dressing or bathing? N N  Doing errands, shopping? N -  Preparing Food and eating ? N -  Using the Toilet? N -  In the past six months, have you accidently leaked urine? Y -  Comment Managed with daily brief. Followed by pcp. Chronic.  -  Do you have problems with loss of bowel control? Y -  Comment Managed with daily brief.  Followed by pcp. Chronic.  -  Managing your Medications? N -  Managing your Finances? N -  Housekeeping or managing your Housekeeping? N -  Some recent data might be hidden    Patient Care Team: Crecencio Mc, MD as PCP - General (Internal Medicine)      Assessment:   This is a routine wellness examination for [facility].  ,The goal of the wellness visit is to assist the patient how to close the gaps in care and create a preventative care plan for the patient.   The roster of all physicians providing medical care to patient is listed in the Snapshot section of the chart.  Taking calcium VIT D as appropriate/Osteoporosis risk reviewed.    Safety issues reviewed; Lives with her sister. Smoke and carbon monoxide detectors in the home. No firearms in the home. Wears seatbelts when driving or riding with others. No violence in the home.  They do not have excessive sun exposure.  Discussed the need for sun protection: hats, long sleeves and the use of sunscreen if there is significant sun exposure.  Patient is alert, normal appearance, oriented to person/place/and time. Correctly identified the president of the [place] and recalls of 1/3 words.Performs simple calculations and can read correct time from watch face. Displays appropriate judgement.  No new identified risk were noted.  No failures at ADL's or IADL's.    BMI- discussed the importance of a healthy diet, water intake and the benefits of aerobic exercise. Educational material provided.   24 hour diet recall: Regular diet.    Dental- every 12 months.  Sleep patterns- Sleeps without issues.   Health maintenance gaps- closed.  Patient Concerns: None at this time. Follow up with PCP as needed.  Exercise Activities and Dietary recommendations Current Exercise Habits: The patient does not participate in regular exercise at present  Goals    . DIET - REDUCE SUGAR INTAKE     Low carb diet Portion control    . Increase physical activity     Chair exercises, daily       Fall Risk Fall Risk  08/16/2017 01/14/2016 01/29/2015 10/21/2013 10/18/2013  Falls in the past year? No  Yes Yes Yes Yes  Number falls in past yr: - 1 1 1 1   Injury with Fall? - No No Yes -  Risk for fall due to :  - - - Other (Comment) Other (Comment)  Follow up - Education provided;Falls prevention discussed - - -    Depression Screen PHQ 2/9 Scores 08/16/2017 01/14/2016 01/29/2015 10/21/2013  PHQ - 2 Score 0 0 0 0     Cognitive Function MMSE - Mini Mental State Exam 08/16/2017  Orientation to time 5  Orientation to Place 5  Registration 3  Attention/ Calculation 5  Recall 1  Language- name 2 objects 2  Language- repeat 1  Language- follow 3 step command 3  Language- read & follow direction 1  Write a sentence 1  Copy design 1  Total score 28     6CIT Screen 01/14/2016  What Year? 0 points  What month? 0 points  What time? 0 points  Count back from 20 0 points  Months in reverse 0 points  Repeat phrase 0 points  Total Score 0    Immunization History  Administered Date(s) Administered  . Influenza Split 09/29/2010, 11/09/2011  . Influenza,inj,Quad PF,6+ Mos 11/08/2012  . Pneumococcal Conjugate-13 02/23/2013  . Pneumococcal Polysaccharide-23 11/09/2003, 12/21/2016  . Tetanus 11/09/2011   Screening Tests Health Maintenance  Topic Date Due  . INFLUENZA VACCINE  12/09/2021 (Originally 09/09/2017)  . HEMOGLOBIN A1C  11/05/2017  . MAMMOGRAM  11/09/2017  . OPHTHALMOLOGY EXAM  01/15/2018  . FOOT EXAM  05/06/2018  . TETANUS/TDAP  11/08/2021  . DEXA SCAN  Completed  . PNA vac Low Risk Adult  Completed       Plan:    End of life planning; Advance aging; Advanced directives discussed. Copy of current HCPOA/Living Will requested.    I have personally reviewed and noted the following in the patient's chart:   . Medical and social history . Use of alcohol, tobacco or illicit drugs  . Current medications and supplements . Functional ability and status . Nutritional status . Physical activity . Advanced directives . List of other physicians . Hospitalizations, surgeries, and ER visits in previous 12 months . Vitals . Screenings to include cognitive, depression, and  falls . Referrals and appointments  In addition, I have reviewed and discussed with patient certain preventive protocols, quality metrics, and best practice recommendations. A written personalized care plan for preventive services as well as general preventive health recommendations were provided to patient.     OBrien-Blaney, Samyiah Halvorsen L, LPN  10/17/3530   I have reviewed the above information and agree with above.   Deborra Medina, MD

## 2017-08-16 NOTE — Patient Instructions (Addendum)
  Ms. Brenda Floyd , Thank you for taking time to come for your Medicare Wellness Visit. I appreciate your ongoing commitment to your health goals. Please review the following plan we discussed and let me know if I can assist you in the future.   These are the goals we discussed: Goals    . DIET - REDUCE SUGAR INTAKE     Low carb diet Portion control    . Increase physical activity     Chair exercises, daily       This is a list of the screening recommended for you and due dates:  Health Maintenance  Topic Date Due  . Flu Shot  12/09/2021*  . Hemoglobin A1C  11/05/2017  . Mammogram  11/09/2017  . Eye exam for diabetics  01/15/2018  . Complete foot exam   05/06/2018  . Tetanus Vaccine  11/08/2021  . DEXA scan (bone density measurement)  Completed  . Pneumonia vaccines  Completed  *Topic was postponed. The date shown is not the original due date.

## 2017-08-28 ENCOUNTER — Other Ambulatory Visit: Payer: Self-pay | Admitting: Internal Medicine

## 2017-09-17 ENCOUNTER — Ambulatory Visit (INDEPENDENT_AMBULATORY_CARE_PROVIDER_SITE_OTHER): Payer: Medicare Other | Admitting: Internal Medicine

## 2017-09-17 ENCOUNTER — Encounter: Payer: Self-pay | Admitting: Internal Medicine

## 2017-09-17 VITALS — BP 124/86 | HR 82 | Temp 98.0°F | Resp 15 | Ht 64.5 in | Wt 276.6 lb

## 2017-09-17 DIAGNOSIS — Z23 Encounter for immunization: Secondary | ICD-10-CM | POA: Diagnosis not present

## 2017-09-17 DIAGNOSIS — E034 Atrophy of thyroid (acquired): Secondary | ICD-10-CM | POA: Diagnosis not present

## 2017-09-17 DIAGNOSIS — E785 Hyperlipidemia, unspecified: Secondary | ICD-10-CM | POA: Diagnosis not present

## 2017-09-17 DIAGNOSIS — Z1231 Encounter for screening mammogram for malignant neoplasm of breast: Secondary | ICD-10-CM

## 2017-09-17 DIAGNOSIS — G4719 Other hypersomnia: Secondary | ICD-10-CM | POA: Diagnosis not present

## 2017-09-17 DIAGNOSIS — M5412 Radiculopathy, cervical region: Secondary | ICD-10-CM | POA: Diagnosis not present

## 2017-09-17 DIAGNOSIS — E669 Obesity, unspecified: Secondary | ICD-10-CM

## 2017-09-17 DIAGNOSIS — Z1239 Encounter for other screening for malignant neoplasm of breast: Secondary | ICD-10-CM

## 2017-09-17 DIAGNOSIS — R0683 Snoring: Secondary | ICD-10-CM | POA: Diagnosis not present

## 2017-09-17 DIAGNOSIS — E1169 Type 2 diabetes mellitus with other specified complication: Secondary | ICD-10-CM | POA: Diagnosis not present

## 2017-09-17 DIAGNOSIS — R0681 Apnea, not elsewhere classified: Secondary | ICD-10-CM

## 2017-09-17 LAB — HEMOGLOBIN A1C: Hgb A1c MFr Bld: 6.1 % (ref 4.6–6.5)

## 2017-09-17 LAB — COMPREHENSIVE METABOLIC PANEL
ALK PHOS: 73 U/L (ref 39–117)
ALT: 27 U/L (ref 0–35)
AST: 20 U/L (ref 0–37)
Albumin: 4.2 g/dL (ref 3.5–5.2)
BUN: 14 mg/dL (ref 6–23)
CO2: 29 meq/L (ref 19–32)
CREATININE: 0.88 mg/dL (ref 0.40–1.20)
Calcium: 9.5 mg/dL (ref 8.4–10.5)
Chloride: 103 mEq/L (ref 96–112)
GFR: 65.86 mL/min (ref >60.00)
GLUCOSE: 97 mg/dL (ref 70–99)
Potassium: 4.7 mEq/L (ref 3.5–5.1)
Sodium: 139 mEq/L (ref 135–145)
TOTAL PROTEIN: 6.8 g/dL (ref 6.0–8.3)
Total Bilirubin: 0.5 mg/dL (ref 0.2–1.2)

## 2017-09-17 LAB — LIPID PANEL
CHOL/HDL RATIO: 5
Cholesterol: 250 mg/dL — ABNORMAL HIGH (ref 0–200)
HDL: 54.7 mg/dL (ref >39.00)
NonHDL: 195.06
TRIGLYCERIDES: 241 mg/dL — AB (ref 0.0–149.0)
VLDL: 48.2 mg/dL — ABNORMAL HIGH (ref 0.0–40.0)

## 2017-09-17 LAB — LDL CHOLESTEROL, DIRECT: Direct LDL: 170 mg/dL

## 2017-09-17 LAB — TSH: TSH: 2.25 u[IU]/mL (ref 0.35–4.50)

## 2017-09-17 MED ORDER — GABAPENTIN 100 MG PO CAPS: 100.0000 mg | ORAL_CAPSULE | Freq: Three times a day (TID) | ORAL | 3 refills | Status: DC

## 2017-09-17 NOTE — Patient Instructions (Addendum)
YOUR HAND PAIN  MAY BE COMING  FROM CERVICAL SPINE STENOSIS,  Let me know if you want an MRI    You can use up to 2000 mg of acetominophen (tylenol) every day safely  In divided doses (500 mg every 6 hours  Or 1000 mg every 12 hours.)  if you need to use celebrex daily limit to once  daily and start a a daily PPI  (Nexium or prilosec OTC available generically at  [facility] )   Trial of gabapentin starting at 100 mg one hour before bedtime.  You may increase gradually if needed ,  To 300 mg    YOUR LAST TETANUS VACCINE WAS 2013 (SO NOT NEEDED UNTIL 2023)

## 2017-09-17 NOTE — Progress Notes (Signed)
Subjective:  Patient ID: Brenda Floyd, female    DOB: Jul 21, 1940  Age: 77 y.o. MRN: [id number]  CC: The primary encounter diagnosis was Snoring. Diagnoses of Witnessed apneic spells, Morbid obesity ([place]), Daytime hypersomnolence, Hyperlipidemia with target LDL less than 70, Diabetes mellitus type 2 in obese ([place]), Hypothyroidism due to acquired atrophy of thyroid, Need for shingles vaccine, Breast cancer screening, and Cervical radiculopathy at C5 were also pertinent to this visit.  HPI Brenda Floyd presents for 5 month follow up on diabetes.  Patient has MULTIPLE PAIN complaints today.   LOW BACK  IT BAND ON RIGHT GETTING BETTER,  PAIN IN SHOULDERS AND ANKLES .  Using celebrex prn  With meals.   Received a Steroid injection to right shoulder helped for 6 weeks and had PG . Arthritis with spurs  rADIATES TO RIGHT HAND THUMB, index and middle finger are becoming numb AT NIGHT .Marland Kitchen TOSSES AND TURNS AT NIGHT BECAUSE NO POSITION IS COMFORTABLE.   snoring loudLy and gasping, apneic spells witnessed by her sister.  TAKES A 2-3 HOUR NAP DAILY DUE TO EXCESSIVE SLEEPINESS NOT EXERCISING  .   Patient is not following a low glycemic index diet but she is  taking all prescribed medications regularly without side effects.  Fasting sugars have been under less than 140 most of the time and post prandials have been under 160 except on rare occasions. Patient is NOT exercising about 3 times per week and intentionally trying to lose weight .  Patient has had an eye exam in the last 12 months and checks feet regularly for signs of infection.  Patient does not walk barefoot outside,  And denies any numbness tingling or burning in feet. Patient is up to date on all recommended vaccinations  Outpatient Medications Prior to Visit  Medication Sig Dispense Refill  . amoxicillin (AMOXIL) 500 MG capsule TAKE 4 CAPSULES BY MOUTH ONE HOUR BEFORE APPOINTMENT 4 capsule 0  . aspirin 81 MG tablet Take 1 tablet (81 mg  total) by mouth daily. 30 tablet 11  . Blood Glucose Monitoring Suppl DEVI Freestyle Libre monitor 1 each 0  . celecoxib (CELEBREX) 200 MG capsule Take 1 capsule (200 mg total) by mouth 2 (two) times daily. As needed for joint pain 60 capsule 2  . Cholecalciferol 50000 units capsule Take 50,000 Units by mouth once a week.    . co-enzyme Q-10 30 MG capsule Take 1 capsule (30 mg total) by mouth daily. 90 capsule 3  . Continuous Blood Gluc Receiver (FREESTYLE LIBRE 14 DAY READER) DEVI 1 Units every 8 (eight) hours by Does not apply route. 1 Device 0  . Continuous Blood Gluc Sensor (FREESTYLE LIBRE SENSOR SYSTEM) MISC Use with freestyle reader 2 each 11  . Cyanocobalamin (VITAMIN B-12 IJ) Inject 1,000 mcg as directed every 30 (thirty) days.    . fexofenadine (ALLEGRA) 180 MG tablet Take 180 mg by mouth daily.    Marland Kitchen glucose blood (FREESTYLE LITE) test strip Use to check blood sugars once daily,  Twice if needed   DM 250.00 102 each 3  . Insulin Glargine (LANTUS SOLOSTAR) 100 UNIT/ML Solostar Pen INJECT 22 UNITS UNDER THE SKIN DAILY 15 mL 6  . levothyroxine (SYNTHROID, LEVOTHROID) 75 MCG tablet TAKE 1 TABLET DAILY FOR HYPOTHYROIDISM 90 tablet 0  . lisinopril (PRINIVIL,ZESTRIL) 20 MG tablet TAKE 1 TABLET BY MOUTH ONCE DAILY 90 tablet 1  . magnesium gluconate (MAGONATE) 500 MG tablet Take 500 mg by mouth daily.    Marland Kitchen  PARoxetine (PAXIL) 20 MG tablet TAKE 1 TABLET DAILY 90 tablet 2  . Syringe/Needle, Disp, (SYRINGE 3CC/25GX1") 25G X 1" 3 ML MISC Use as directed 50 each 0  . TURMERIC PO Take by mouth daily.     No facility-administered medications prior to visit.     Review of Systems;  Patient denies headache, fevers, malaise, unintentional weight loss, skin rash, eye pain, sinus congestion and sinus pain, sore throat, dysphagia,  hemoptysis , cough, dyspnea, wheezing, chest pain, palpitations, orthopnea, edema, abdominal pain, nausea, melena, diarrhea, constipation, flank pain, dysuria, hematuria,  urinary  Frequency, nocturia, numbness, tingling, seizures,  Focal weakness, Loss of consciousness,  Tremor, insomnia, depression, anxiety, and suicidal ideation.      Objective:  BP 124/86 (BP Location: Left Arm, Patient Position: Sitting, Cuff Size: Large)   Pulse 82   Temp 98 F (36.7 C) (Oral)   Resp 15   Ht 5' 4.5" (1.638 m)   Wt 276 lb 9.6 oz (125.5 kg)   SpO2 96%   BMI 46.75 kg/m   BP Readings from Last 3 Encounters:  09/17/17 124/86  08/16/17 116/62  05/05/17 116/82    Wt Readings from Last 3 Encounters:  09/17/17 276 lb 9.6 oz (125.5 kg)  08/16/17 276 lb (125.2 kg)  05/05/17 272 lb 6.4 oz (123.6 kg)    General appearance: alert, cooperative and appears stated age Ears: normal TM's and external ear canals both ears Throat: lips, mucosa, and tongue normal; teeth and gums normal Neck: no adenopathy, no carotid bruit, supple, symmetrical, trachea midline and thyroid not enlarged, symmetric, no tenderness/mass/nodules Back: symmetric, no curvature. ROM normal. No CVA tenderness. Lungs: clear to auscultation bilaterally Heart: regular rate and rhythm, S1, S2 normal, no murmur, click, rub or gallop Abdomen: soft, non-tender; bowel sounds normal; no masses,  no organomegaly Pulses: 2+ and symmetric Skin: Skin color, texture, turgor normal. No rashes or lesions Lymph nodes: Cervical, supraclavicular, and axillary nodes normal.  Lab Results  Component Value Date   HGBA1C 6.1 09/17/2017   HGBA1C 5.6 05/05/2017   HGBA1C 6.0 12/21/2016    Lab Results  Component Value Date   CREATININE 0.88 09/17/2017   CREATININE 0.83 12/21/2016   CREATININE 0.96 09/17/2016    Lab Results  Component Value Date   WBC 5.3 08/08/2012   HGB 13.5 08/08/2012   HCT 41.2 08/08/2012   PLT 216.0 08/08/2012   GLUCOSE 97 09/17/2017   CHOL 250 (H) 09/17/2017   TRIG 241.0 (H) 09/17/2017   HDL 54.70 09/17/2017   LDLDIRECT 170.0 09/17/2017   LDLCALC 167 (H) 05/26/2013   ALT 27 09/17/2017    AST 20 09/17/2017   NA 139 09/17/2017   K 4.7 09/17/2017   CL 103 09/17/2017   CREATININE 0.88 09/17/2017   BUN 14 09/17/2017   CO2 29 09/17/2017   TSH 2.25 09/17/2017   HGBA1C 6.1 09/17/2017   MICROALBUR <0.7 12/21/2016    No results found.  Assessment & Plan:   Problem List Items Addressed This Visit    Diabetes mellitus type 2 in obese ([place])     well-controlled on 22 units of Lantuss and metformin.   hemoglobin AC has been consistently at or  less than 6.5. Patient is using the freestyle Libre system to monitor blood sugars but continues to gain weight due to lack of exercise.  She is not motivated to exercise due to multiple joint issues,  Despite having daily access to a swimming pool.. she is up-to-date on eye exams  and foot exam is normal today except for pes planus. . Patient has no evidence of  Microalbuminuria.  Patient is tolerating aspirin and red yeast rice  for CAD risk reduction and is already on on ACE/ARB .\  Lab Results  Component Value Date   HGBA1C 6.1 09/17/2017   Lab Results  Component Value Date   MICROALBUR <0.7 12/21/2016         Relevant Orders   Hemoglobin A1c (Completed)   Comprehensive metabolic panel (Completed)   Morbid obesity ([place])    I have addressed  BMI and recommended wt loss of 10% of body weight over the next 6 months using a low glycemic index diet and regular exercise using her swimming pool. a minimum of 5 days per week.        Relevant Orders   Ambulatory referral to Sleep Studies   Hyperlipidemia with target LDL less than 70   Relevant Orders   Lipid panel (Completed)   Hypothyroidism due to acquired atrophy of thyroid   Relevant Orders   TSH (Completed)   Witnessed apneic spells    With snoring,  Daytime hypersomnolence .  Sleep study ordered to rule out suspected OSA       Relevant Orders   Ambulatory referral to Sleep Studies   Cervical radiculopathy at C5    Trial of gabapentin for control of pain        Relevant Medications   gabapentin (NEURONTIN) 100 MG capsule    Other Visit Diagnoses    Snoring    -  Primary   Relevant Orders   Ambulatory referral to Sleep Studies   Daytime hypersomnolence       Relevant Orders   Ambulatory referral to Sleep Studies   Need for shingles vaccine       Relevant Orders   Varicella Zoster Abs, IgG/IgM (Completed)   Breast cancer screening       Relevant Orders   MM 3D SCREEN BREAST BILATERAL      I am having Brenda Floyd start on gabapentin. I am also having her maintain her aspirin, glucose blood, co-enzyme Q-10, TURMERIC PO, SYRINGE 3CC/25GX1", fexofenadine, magnesium gluconate, amoxicillin, Blood Glucose Monitoring Suppl, Cholecalciferol, FREESTYLE LIBRE 14 DAY READER, FREESTYLE LIBRE SENSOR SYSTEM, PARoxetine, Cyanocobalamin (VITAMIN B-12 IJ), celecoxib, lisinopril, Insulin Glargine, and levothyroxine.  Meds ordered this encounter  Medications  . gabapentin (NEURONTIN) 100 MG capsule    Sig: Take 1 capsule (100 mg total) by mouth 3 (three) times daily.    Dispense:  90 capsule    Refill:  3    There are no discontinued medications.  Follow-up: Return in about 6 months (around 03/20/2018) for follow up diabetes.   Crecencio Mc, MD

## 2017-09-18 DIAGNOSIS — R0681 Apnea, not elsewhere classified: Secondary | ICD-10-CM | POA: Insufficient documentation

## 2017-09-18 DIAGNOSIS — M5412 Radiculopathy, cervical region: Secondary | ICD-10-CM | POA: Insufficient documentation

## 2017-09-18 NOTE — Assessment & Plan Note (Signed)
Trial of gabapentin for control of pain

## 2017-09-18 NOTE — Assessment & Plan Note (Signed)
well-controlled on 22 units of Lantuss and metformin.   hemoglobin AC has been consistently at or  less than 6.5. Patient is using the freestyle Libre system to monitor blood sugars but continues to gain weight due to lack of exercise.  She is not motivated to exercise due to multiple joint issues,  Despite having daily access to a swimming pool.. she is up-to-date on eye exams and foot exam is normal today except for pes planus. . Patient has no evidence of  Microalbuminuria.  Patient is tolerating aspirin and red yeast rice  for CAD risk reduction and is already on on ACE/ARB .\  Lab Results  Component Value Date   HGBA1C 6.1 09/17/2017   Lab Results  Component Value Date   MICROALBUR <0.7 12/21/2016

## 2017-09-18 NOTE — Assessment & Plan Note (Signed)
With snoring,  Daytime hypersomnolence .  Sleep study ordered to rule out suspected OSA

## 2017-09-18 NOTE — Assessment & Plan Note (Signed)
I have addressed  BMI and recommended wt loss of 10% of body weight over the next 6 months using a low glycemic index diet and regular exercise using her swimming pool. a minimum of 5 days per week.

## 2017-09-20 LAB — VARICELLA ZOSTER ABS, IGG/IGM: VARICELLA: 183 {index} (ref >165)

## 2017-09-22 ENCOUNTER — Telehealth: Payer: Self-pay

## 2017-09-22 NOTE — Telephone Encounter (Signed)
PA for Gabapentin has been submitted on covermymeds.

## 2017-09-23 DIAGNOSIS — M25511 Pain in right shoulder: Secondary | ICD-10-CM

## 2017-09-23 DIAGNOSIS — M5412 Radiculopathy, cervical region: Secondary | ICD-10-CM

## 2017-09-23 NOTE — Telephone Encounter (Signed)
Gabapentin has been approved from 08/23/2017 - 09/23/2018. Pt and pharmacy are aware.

## 2017-09-30 ENCOUNTER — Other Ambulatory Visit: Payer: Self-pay | Admitting: Internal Medicine

## 2017-09-30 MED ORDER — FREESTYLE LIBRE SENSOR SYSTEM MISC: 2 refills | Status: DC

## 2017-10-01 ENCOUNTER — Ambulatory Visit: Payer: Medicare Other | Attending: Neurology

## 2017-10-01 DIAGNOSIS — Z6841 Body Mass Index (BMI) 40.0 and over, adult: Secondary | ICD-10-CM | POA: Diagnosis not present

## 2017-10-01 DIAGNOSIS — R0683 Snoring: Secondary | ICD-10-CM | POA: Diagnosis present

## 2017-10-01 DIAGNOSIS — G4733 Obstructive sleep apnea (adult) (pediatric): Secondary | ICD-10-CM | POA: Diagnosis not present

## 2017-10-01 DIAGNOSIS — G4719 Other hypersomnia: Secondary | ICD-10-CM | POA: Diagnosis not present

## 2017-10-01 DIAGNOSIS — F5101 Primary insomnia: Secondary | ICD-10-CM | POA: Diagnosis not present

## 2017-10-12 ENCOUNTER — Telehealth: Payer: Self-pay | Admitting: Internal Medicine

## 2017-10-12 DIAGNOSIS — G4733 Obstructive sleep apnea (adult) (pediatric): Secondary | ICD-10-CM

## 2017-10-12 NOTE — Telephone Encounter (Signed)
LMTCB. Please transfer pt to our office.  

## 2017-10-12 NOTE — Assessment & Plan Note (Signed)
CPAP titration study ordered

## 2017-10-12 NOTE — Telephone Encounter (Signed)
Sleep study confirmed that patient has sleep apnea,  but will need a second study to determine paramters for CPAP use.  This has been ordered    need to address sleep hygiene issues that may also be affecting the quality of her sleep   Please make appt .

## 2017-10-13 NOTE — Telephone Encounter (Signed)
Spoke with pt and informed her of her sleep study results. Pt gave a verbal understanding. Scheduled pt for a follow up with Dr. Derrel Nip to discuss sleep hygiene. Pt is aware of appt date and time.

## 2017-10-18 ENCOUNTER — Encounter: Payer: Self-pay | Admitting: Internal Medicine

## 2017-10-18 ENCOUNTER — Ambulatory Visit (INDEPENDENT_AMBULATORY_CARE_PROVIDER_SITE_OTHER): Payer: Medicare Other | Admitting: Internal Medicine

## 2017-10-18 DIAGNOSIS — G4730 Sleep apnea, unspecified: Secondary | ICD-10-CM

## 2017-10-18 DIAGNOSIS — G4710 Hypersomnia, unspecified: Secondary | ICD-10-CM | POA: Diagnosis not present

## 2017-10-18 MED ORDER — CYCLOBENZAPRINE HCL 10 MG PO TABS: 10.0000 mg | ORAL_TABLET | Freq: Three times a day (TID) | ORAL | 5 refills | Status: DC | PRN

## 2017-10-18 NOTE — Progress Notes (Signed)
Subjective:  Patient ID: Brenda Floyd, female    DOB: 08/17/1940  Age: 77 y.o. MRN: [id number]  CC: Diagnoses of Morbid obesity ([place]) and Sleep apnea with hypersomnolence were pertinent to this visit.  HPI Brenda Floyd presents for follow up on CPAP study, which was done due to patient's report of hypersomnolence and snoring in hte setting of morbid obesity.   Results of study discussed.  She is scheduled for a  CPAP study  On  Thursday. Sleeping position is problematic ans she prefers to sleep on sides and rolls from side to side . Also has chronic nasal congestion, allergic rhinitis,  And chronic right shoulder pain that keeps her awake at night.  Typically spends about 10-11 hours in bed every night.  Nocturnal habits reviewed in detail.    Discussed weight loss:  Has lost 3 lbs in a month  Cutting out wheat .  Not exercising due to bilateral knee pain and sedentary habits.     Outpatient Medications Prior to Visit  Medication Sig Dispense Refill  . amoxicillin (AMOXIL) 500 MG capsule TAKE 4 CAPSULES BY MOUTH ONE HOUR BEFORE APPOINTMENT 4 capsule 0  . aspirin 81 MG tablet Take 1 tablet (81 mg total) by mouth daily. 30 tablet 11  . Blood Glucose Monitoring Suppl DEVI Freestyle Libre monitor 1 each 0  . celecoxib (CELEBREX) 200 MG capsule Take 1 capsule (200 mg total) by mouth 2 (two) times daily. As needed for joint pain 60 capsule 2  . Cholecalciferol 50000 units capsule Take 50,000 Units by mouth once a week.    . co-enzyme Q-10 30 MG capsule Take 1 capsule (30 mg total) by mouth daily. 90 capsule 3  . Continuous Blood Gluc Receiver (FREESTYLE LIBRE 14 DAY READER) DEVI 1 Units every 8 (eight) hours by Does not apply route. 1 Device 0  . Continuous Blood Gluc Sensor (FREESTYLE LIBRE SENSOR SYSTEM) MISC Use with freestyle reader 6 each 2  . Cyanocobalamin (VITAMIN B-12 IJ) Inject 1,000 mcg as directed every 30 (thirty) days.    . fexofenadine (ALLEGRA) 180 MG tablet Take 180  mg by mouth daily.    Marland Kitchen gabapentin (NEURONTIN) 100 MG capsule Take 1 capsule (100 mg total) by mouth 3 (three) times daily. 90 capsule 3  . glucose blood (FREESTYLE LITE) test strip Use to check blood sugars once daily,  Twice if needed   DM 250.00 102 each 3  . Insulin Glargine (LANTUS SOLOSTAR) 100 UNIT/ML Solostar Pen INJECT 22 UNITS UNDER THE SKIN DAILY 15 mL 6  . levothyroxine (SYNTHROID, LEVOTHROID) 75 MCG tablet TAKE 1 TABLET DAILY FOR HYPOTHYROIDISM 90 tablet 0  . lisinopril (PRINIVIL,ZESTRIL) 20 MG tablet TAKE 1 TABLET BY MOUTH ONCE DAILY 90 tablet 1  . magnesium gluconate (MAGONATE) 500 MG tablet Take 500 mg by mouth daily.    Marland Kitchen PARoxetine (PAXIL) 20 MG tablet TAKE 1 TABLET DAILY 90 tablet 1  . Syringe/Needle, Disp, (SYRINGE 3CC/25GX1") 25G X 1" 3 ML MISC Use as directed 50 each 0  . TURMERIC PO Take by mouth daily.     No facility-administered medications prior to visit.     Review of Systems;  Patient denies headache, fevers, malaise, unintentional weight loss, skin rash, eye pain, sinus congestion and sinus pain, sore throat, dysphagia,  hemoptysis , cough, dyspnea, wheezing, chest pain, palpitations, orthopnea, edema, abdominal pain, nausea, melena, diarrhea, constipation, flank pain, dysuria, hematuria, urinary  Frequency, nocturia, numbness, tingling, seizures,  Focal weakness, Loss of  consciousness,  Tremor, insomnia, depression, anxiety, and suicidal ideation.      Objective:  BP 136/82 (BP Location: Left Arm, Patient Position: Sitting, Cuff Size: Large)   Pulse 75   Temp 98.3 F (36.8 C) (Oral)   Resp 16   Ht 5' 4.5" (1.638 m)   Wt 273 lb 9.6 oz (124.1 kg)   SpO2 95%   BMI 46.24 kg/m   BP Readings from Last 3 Encounters:  10/18/17 136/82  09/17/17 124/86  08/16/17 116/62    Wt Readings from Last 3 Encounters:  10/18/17 273 lb 9.6 oz (124.1 kg)  09/17/17 276 lb 9.6 oz (125.5 kg)  08/16/17 276 lb (125.2 kg)    General appearance: alert, cooperative and  appears stated age Ears: normal TM's and external ear canals both ears Throat: lips, mucosa, and tongue normal; teeth and gums normal Neck: no adenopathy, no carotid bruit, supple, symmetrical, trachea midline and thyroid not enlarged, symmetric, no tenderness/mass/nodules Back: symmetric, no curvature. ROM normal. No CVA tenderness. Lungs: clear to auscultation bilaterally Heart: regular rate and rhythm, S1, S2 normal, no murmur, click, rub or gallop Abdomen: soft, non-tender; bowel sounds normal; no masses,  no organomegaly Pulses: 2+ and symmetric Skin: Skin color, texture, turgor normal. No rashes or lesions Lymph nodes: Cervical, supraclavicular, and axillary nodes normal.  Lab Results  Component Value Date   HGBA1C 6.1 09/17/2017   HGBA1C 5.6 05/05/2017   HGBA1C 6.0 12/21/2016    Lab Results  Component Value Date   CREATININE 0.88 09/17/2017   CREATININE 0.83 12/21/2016   CREATININE 0.96 09/17/2016    Lab Results  Component Value Date   WBC 5.3 08/08/2012   HGB 13.5 08/08/2012   HCT 41.2 08/08/2012   PLT 216.0 08/08/2012   GLUCOSE 97 09/17/2017   CHOL 250 (H) 09/17/2017   TRIG 241.0 (H) 09/17/2017   HDL 54.70 09/17/2017   LDLDIRECT 170.0 09/17/2017   LDLCALC 167 (H) 05/26/2013   ALT 27 09/17/2017   AST 20 09/17/2017   NA 139 09/17/2017   K 4.7 09/17/2017   CL 103 09/17/2017   CREATININE 0.88 09/17/2017   BUN 14 09/17/2017   CO2 29 09/17/2017   TSH 2.25 09/17/2017   HGBA1C 6.1 09/17/2017   MICROALBUR <0.7 12/21/2016    No results found.  Assessment & Plan:   Problem List Items Addressed This Visit    Morbid obesity ([place])    I have addressed  BMI and recommended wt loss of 10% of body weigh over the next 6 months using a low glycemic index diet and regular exercise a minimum of 5 days per week.        Sleep apnea with hypersomnolence    Diagnosed by study,  Titration study ordered,  Pain and congestion issues addressed today in anticipation of  titration study         A total of 40 minutes of face to face time was spent with patient more than half of which was spent in counselling and coordination of care   I am having Brenda Floyd start on cyclobenzaprine. I am also having her maintain her aspirin, glucose blood, co-enzyme Q-10, TURMERIC PO, SYRINGE 3CC/25GX1", fexofenadine, magnesium gluconate, amoxicillin, Blood Glucose Monitoring Suppl, Cholecalciferol, FREESTYLE LIBRE 14 DAY READER, Cyanocobalamin (VITAMIN B-12 IJ), celecoxib, lisinopril, Insulin Glargine, levothyroxine, gabapentin, PARoxetine, and [place].  Meds ordered this encounter  Medications  . cyclobenzaprine (FLEXERIL) 10 MG tablet    Sig: Take 1 tablet (10 mg  total) by mouth 3 (three) times daily as needed for muscle spasms.    Dispense:  30 tablet    Refill:  5    There are no discontinued medications.  Follow-up: Return in about 3 months (around 01/17/2018) for follow up diabetes.   Crecencio Mc, MD

## 2017-10-18 NOTE — Patient Instructions (Addendum)
Ok to double Veterinary surgeon dose (  One in the am and  One in the  pm )  Ok to use afrin at night if needed while using nasonex  Nap no more than 30 minutes daily   Add 1000  mg tylenol  2 hours  before bedtime  Along with  flexeril   Stop  Using all blue  lit devices  2 hours before bedtime

## 2017-10-19 DIAGNOSIS — G4710 Hypersomnia, unspecified: Secondary | ICD-10-CM | POA: Insufficient documentation

## 2017-10-19 DIAGNOSIS — G4730 Sleep apnea, unspecified: Secondary | ICD-10-CM

## 2017-10-19 NOTE — Assessment & Plan Note (Signed)
I have addressed  BMI and recommended wt loss of 10% of body weigh over the next 6 months using a low glycemic index diet and regular exercise a minimum of 5 days per week.   

## 2017-10-19 NOTE — Assessment & Plan Note (Signed)
Diagnosed by study,  Titration study ordered,  Pain and congestion issues addressed today in anticipation of titration study

## 2017-10-21 ENCOUNTER — Ambulatory Visit: Payer: Medicare Other | Attending: Otolaryngology

## 2017-10-21 DIAGNOSIS — F5101 Primary insomnia: Secondary | ICD-10-CM | POA: Insufficient documentation

## 2017-10-21 DIAGNOSIS — G4733 Obstructive sleep apnea (adult) (pediatric): Secondary | ICD-10-CM | POA: Diagnosis not present

## 2017-10-31 ENCOUNTER — Other Ambulatory Visit: Payer: Self-pay | Admitting: Internal Medicine

## 2017-10-31 ENCOUNTER — Telehealth: Payer: Self-pay | Admitting: Internal Medicine

## 2017-10-31 DIAGNOSIS — G4733 Obstructive sleep apnea (adult) (pediatric): Secondary | ICD-10-CM

## 2017-10-31 NOTE — Telephone Encounter (Signed)
CPAP titration study report received.  DME order written . Please print

## 2017-10-31 NOTE — Progress Notes (Signed)
CPAP titration study report received.  DME order written . Please print

## 2017-11-05 ENCOUNTER — Encounter: Payer: Self-pay | Admitting: Internal Medicine

## 2017-11-08 NOTE — Telephone Encounter (Signed)
DME order has been faxed.  

## 2017-11-12 ENCOUNTER — Other Ambulatory Visit: Payer: Self-pay | Admitting: Internal Medicine

## 2017-11-12 DIAGNOSIS — Z1231 Encounter for screening mammogram for malignant neoplasm of breast: Secondary | ICD-10-CM

## 2017-11-22 ENCOUNTER — Ambulatory Visit
Admission: RE | Admit: 2017-11-22 | Discharge: 2017-11-22 | Disposition: A | Discharge status: Home / Self Care | Payer: Medicare Other | Source: Ambulatory Visit | Attending: Internal Medicine | Admitting: Internal Medicine

## 2017-11-22 DIAGNOSIS — Z1231 Encounter for screening mammogram for malignant neoplasm of breast: Secondary | ICD-10-CM

## 2017-11-27 ENCOUNTER — Other Ambulatory Visit: Payer: Self-pay | Admitting: Internal Medicine

## 2017-12-13 IMAGING — DX DG SHOULDER 2+V*R*
2 series · 2 of 2 positions shown · non-contrast
Comparison: None.

CLINICAL DATA: Chronic right shoulder pain following fall 2 months
ago. Initial encounter.

EXAM:
RIGHT SHOULDER - 2+ VIEW

[shoulder (grashey) ap]
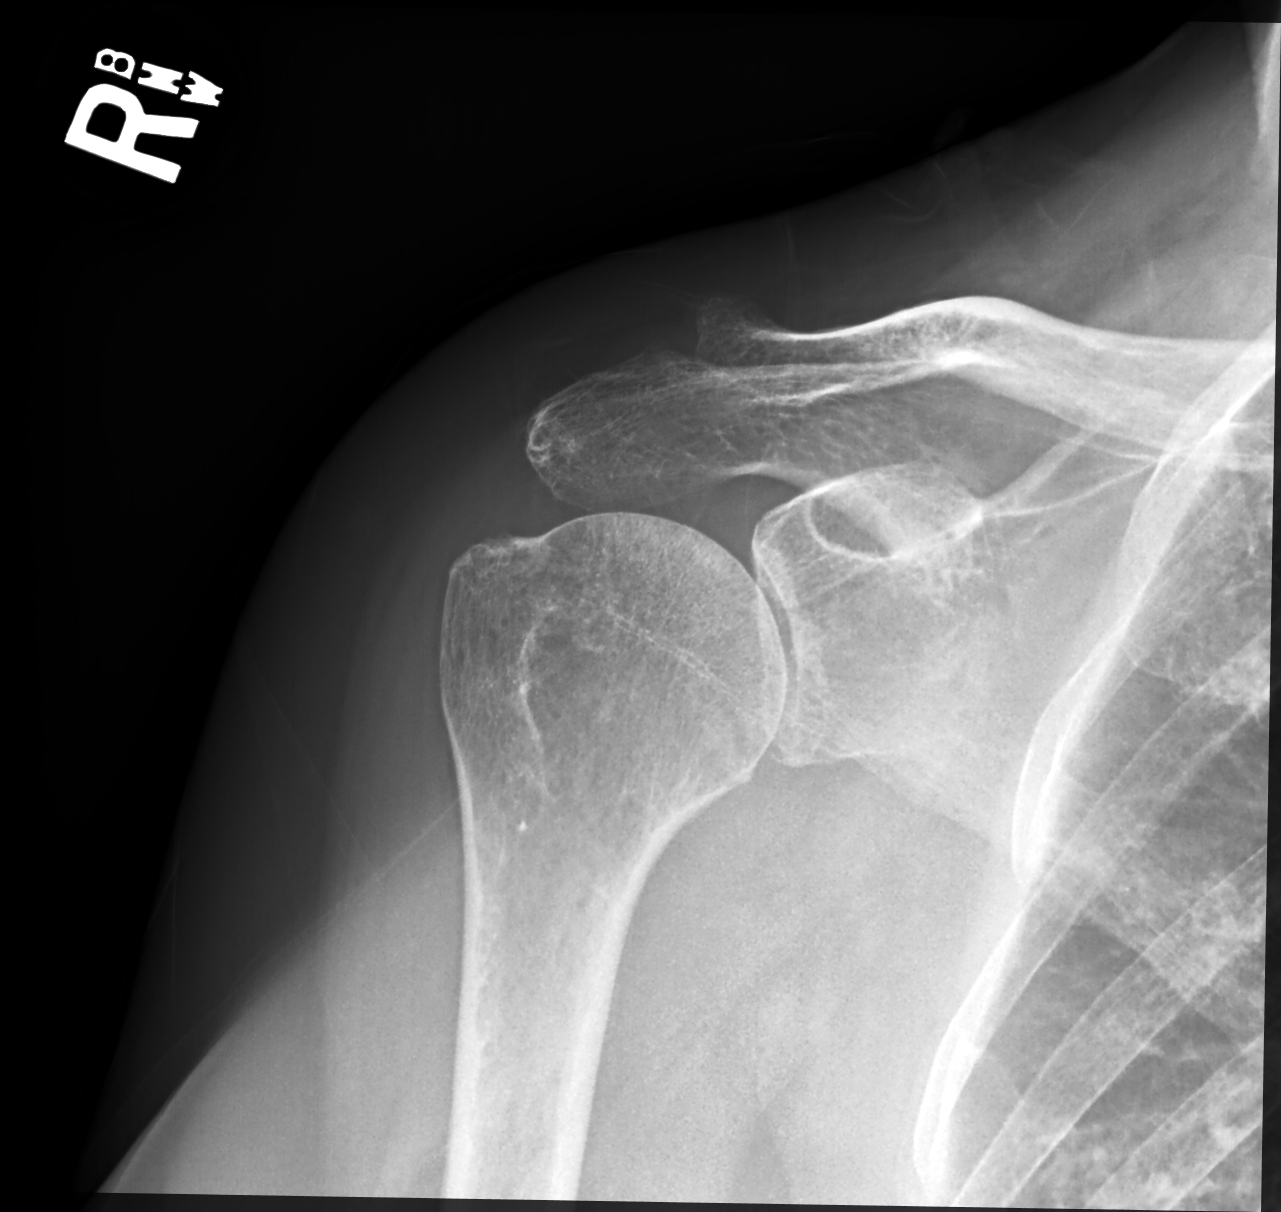

[shoulder y view]
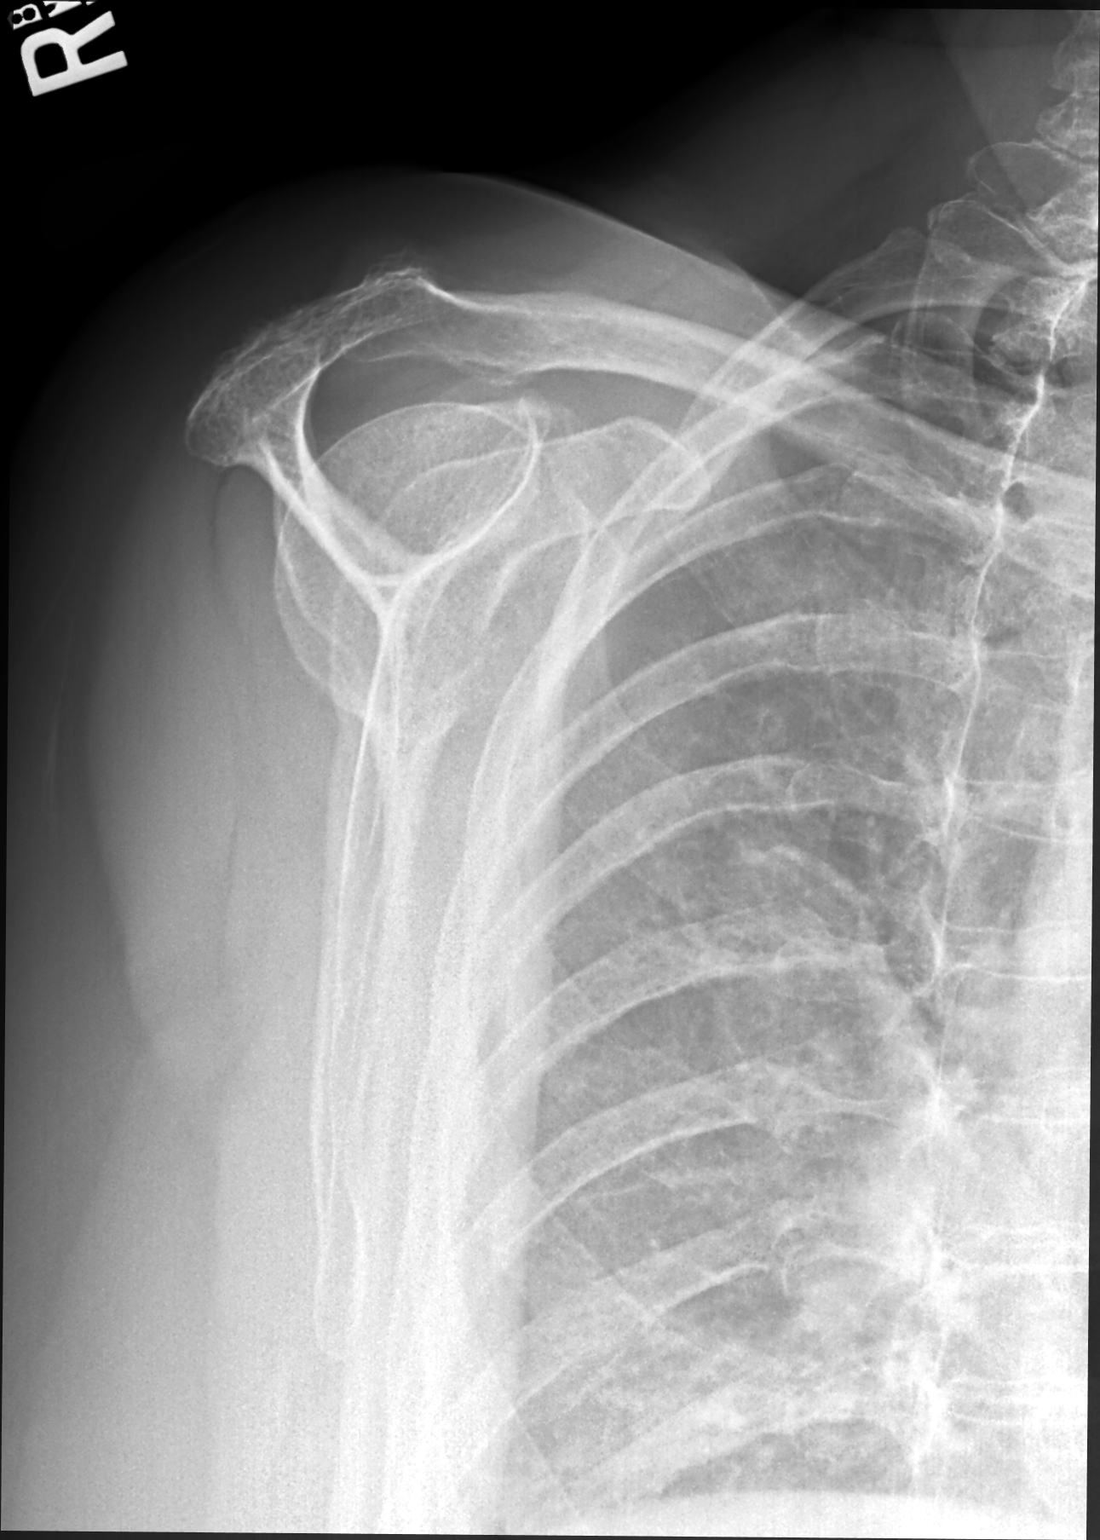

[2 of 2 positions shown; findings below may reference images not displayed]

FINDINGS: There is no evidence of acute fracture, subluxation or dislocation.

No focal bony abnormalities are present.

The joint spaces are unremarkable.
IMPRESSION: Negative.

## 2018-01-06 ENCOUNTER — Other Ambulatory Visit: Payer: Self-pay | Admitting: Internal Medicine

## 2018-01-11 ENCOUNTER — Other Ambulatory Visit: Payer: Self-pay

## 2018-01-27 ENCOUNTER — Other Ambulatory Visit: Payer: Self-pay | Admitting: Internal Medicine

## 2018-02-16 ENCOUNTER — Other Ambulatory Visit: Payer: Self-pay | Admitting: Internal Medicine

## 2018-03-16 ENCOUNTER — Other Ambulatory Visit: Payer: Self-pay | Admitting: Internal Medicine

## 2018-03-23 ENCOUNTER — Ambulatory Visit: Payer: Medicare Other | Admitting: Internal Medicine

## 2018-03-25 ENCOUNTER — Other Ambulatory Visit: Payer: Self-pay | Admitting: Internal Medicine

## 2018-03-29 ENCOUNTER — Other Ambulatory Visit: Payer: Self-pay | Admitting: Internal Medicine

## 2018-04-06 ENCOUNTER — Other Ambulatory Visit: Payer: Self-pay | Admitting: Internal Medicine

## 2018-04-15 ENCOUNTER — Ambulatory Visit (INDEPENDENT_AMBULATORY_CARE_PROVIDER_SITE_OTHER): Payer: Medicare Other | Admitting: Internal Medicine

## 2018-04-15 ENCOUNTER — Encounter: Payer: Self-pay | Admitting: Internal Medicine

## 2018-04-15 VITALS — BP 122/88 | HR 96 | Temp 97.4°F | Resp 16 | Ht 64.5 in | Wt 273.8 lb

## 2018-04-15 DIAGNOSIS — E669 Obesity, unspecified: Secondary | ICD-10-CM

## 2018-04-15 DIAGNOSIS — I10 Essential (primary) hypertension: Secondary | ICD-10-CM

## 2018-04-15 DIAGNOSIS — E034 Atrophy of thyroid (acquired): Secondary | ICD-10-CM

## 2018-04-15 DIAGNOSIS — E785 Hyperlipidemia, unspecified: Secondary | ICD-10-CM

## 2018-04-15 DIAGNOSIS — E1169 Type 2 diabetes mellitus with other specified complication: Secondary | ICD-10-CM

## 2018-04-15 MED ORDER — ZOSTER VAC RECOMB ADJUVANTED 50 MCG/0.5ML IM SUSR: 0.5000 mL | Freq: Once | INTRAMUSCULAR | 1 refills | Status: AC

## 2018-04-15 NOTE — Patient Instructions (Signed)
Good to see you!   You are doing well!  No changes to meds today    See you in 6 months   The ShingRx vaccine is now available in local pharmacies and is much more protective than the old one  Zostavax  (it is about 97%  Effective in preventing shingles). .   It is therefore ADVISED for all interested adults over 50 to prevent shingles so I have printed you a prescription for it.  (it requires a 2nd dose 2 too 6 months after the first one) .  It will cause you to have flu  like symptoms for 2 days

## 2018-04-15 NOTE — Progress Notes (Signed)
Subjective:  Patient ID: Brenda Floyd, female    DOB: [birth year]  Age: 78 y.o. MRN: [id number]  CC: The primary encounter diagnosis was Diabetes mellitus type 2 in obese ([place]). Diagnoses of Essential hypertension, Hypothyroidism due to acquired atrophy of thyroid, and Hyperlipidemia with target LDL less than 70 were also pertinent to this visit.  HPI Brenda Floyd presents for 6  month follow up on type 2 DM , morbid obesity and untreated hyperlipidemia.   Patient has been having back and shoulder pain since she fell at [employer] one month ago.  No prior evaluation   ,  Did not bruise back or shoulder,  But ROM has been limited due to pain   OSA diagnosed in September 2019 she has :  Using cpap with good results per download of reports . Breathing through nose . Waking up feeling more rested,  And has more energy through out the day   .  Patient is following a low glycemic index diet and taking all prescribed medications regularly without side effects.  Has reduced dose of lantus to  12 units of lantus  Fasting sugars have been under 100 most of the time and post prandials have been under 160 except on rare occasions. Patient not does exercise regularly and is not  intentionally trying to lose weight .  Patient has had an eye exam in May,  Is s/p sequential bilateral cataract extraction ,  and checks feet regularly for signs of infection.  Patient does not walk barefoot outside,  And denies an numbness tingling or burning in feet. Patient is up to date on all recommended vaccinations     Outpatient Medications Prior to Visit  Medication Sig Dispense Refill  . amoxicillin (AMOXIL) 500 MG capsule TAKE 4 CAPSULES BY MOUTH ONE HOUR BEFORE APPOINTMENT 4 capsule 0  . aspirin 81 MG tablet Take 1 tablet (81 mg total) by mouth daily. 30 tablet 11  . Blood Glucose Monitoring Suppl DEVI Freestyle Libre monitor 1 each 0  . celecoxib (CELEBREX) 200 MG capsule TAKE 1 CAPSULE BY MOUTH TWICE DAILY AS  NEEDED FOR JOINT PAIN 60 capsule 0  . Cholecalciferol 50000 units capsule Take 50,000 Units by mouth once a week.    . co-enzyme Q-10 30 MG capsule Take 1 capsule (30 mg total) by mouth daily. 90 capsule 3  . Continuous Blood Gluc Receiver (FREESTYLE LIBRE 14 DAY READER) DEVI 1 Units every 8 (eight) hours by Does not apply route. 1 Device 0  . Continuous Blood Gluc Sensor (FREESTYLE LIBRE SENSOR SYSTEM) MISC Use with freestyle reader 6 each 2  . cyanocobalamin (,VITAMIN B-12,) 1000 MCG/ML injection INJECT 1 ML MONTHLY 10 mL 2  . cyclobenzaprine (FLEXERIL) 10 MG tablet Take 1 tablet by mouth three times daily as needed for muscle spasm 30 tablet 0  . fexofenadine (ALLEGRA) 180 MG tablet Take 180 mg by mouth daily.    Marland Kitchen gabapentin (NEURONTIN) 100 MG capsule Take 1 capsule (100 mg total) by mouth 3 (three) times daily. 90 capsule 3  . glucose blood (FREESTYLE LITE) test strip Use to check blood sugars once daily,  Twice if needed   DM 250.00 102 each 3  . Insulin Glargine (LANTUS SOLOSTAR) 100 UNIT/ML Solostar Pen INJECT 22 UNITS UNDER THE SKIN DAILY 15 mL 6  . levothyroxine (SYNTHROID, LEVOTHROID) 75 MCG tablet TAKE 1 TABLET DAILY FOR HYPOTHYROIDISM 90 tablet 2  . magnesium gluconate (MAGONATE) 500 MG tablet Take 500 mg by  mouth daily.    . Omega-3 Fatty Acids (FISH OIL) 1200 MG CAPS Take 1 capsule by mouth 2 (two) times daily.     Marland Kitchen PARoxetine (PAXIL) 20 MG tablet TAKE 1 TABLET DAILY 90 tablet 1  . Syringe/Needle, Disp, (SYRINGE 3CC/25GX1") 25G X 1" 3 ML MISC Use as directed 50 each 0  . TURMERIC PO Take by mouth daily.    Marland Kitchen lisinopril (PRINIVIL,ZESTRIL) 20 MG tablet TAKE 1 TABLET BY MOUTH ONCE DAILY 90 tablet 1  . Cyanocobalamin (VITAMIN B-12 IJ) Inject 1,000 mcg as directed every 30 (thirty) days.     No facility-administered medications prior to visit.     Review of Systems;  Patient denies headache, fevers, malaise, unintentional weight loss, skin rash, eye pain, sinus congestion and  sinus pain, sore throat, dysphagia,  hemoptysis , cough, dyspnea, wheezing, chest pain, palpitations, orthopnea, edema, abdominal pain, nausea, melena, diarrhea, constipation, flank pain, dysuria, hematuria, urinary  Frequency, nocturia, numbness, tingling, seizures,  Focal weakness, Loss of consciousness,  Tremor, insomnia, depression, anxiety, and suicidal ideation.      Objective:  BP 122/88 (BP Location: Left Arm, Patient Position: Sitting, Cuff Size: Large)   Pulse 96   Temp (!) 97.4 F (36.3 C) (Oral)   Resp 16   Ht 5' 4.5" (1.638 m)   Wt 273 lb 12.8 oz (124.2 kg)   SpO2 94%   BMI 46.27 kg/m   BP Readings from Last 3 Encounters:  04/15/18 122/88  10/18/17 136/82  09/17/17 124/86    Wt Readings from Last 3 Encounters:  04/15/18 273 lb 12.8 oz (124.2 kg)  10/18/17 273 lb 9.6 oz (124.1 kg)  09/17/17 276 lb 9.6 oz (125.5 kg)    General appearance: alert, cooperative and appears stated age Ears: normal TM's and external ear canals both ears Throat: lips, mucosa, and tongue normal; teeth and gums normal Neck: no adenopathy, no carotid bruit, supple, symmetrical, trachea midline and thyroid not enlarged, symmetric, no tenderness/mass/nodules Back: symmetric, no curvature. ROM normal. No CVA tenderness. Lungs: clear to auscultation bilaterally Heart: regular rate and rhythm, S1, S2 normal, no murmur, click, rub or gallop Abdomen: soft, non-tender; bowel sounds normal; no masses,  no organomegaly Pulses: 2+ and symmetric Skin: Skin color, texture, turgor normal. No rashes or lesions Lymph nodes: Cervical, supraclavicular, and axillary nodes normal.  Lab Results  Component Value Date   HGBA1C 5.9 (H) 04/15/2018   HGBA1C 6.1 09/17/2017   HGBA1C 5.6 05/05/2017    Lab Results  Component Value Date   CREATININE 0.88 04/15/2018   CREATININE 0.88 09/17/2017   CREATININE 0.83 12/21/2016    Lab Results  Component Value Date   WBC 5.3 08/08/2012   HGB 13.5 08/08/2012    HCT 41.2 08/08/2012   PLT 216.0 08/08/2012   GLUCOSE 107 (H) 04/15/2018   CHOL 250 (H) 09/17/2017   TRIG 241.0 (H) 09/17/2017   HDL 54.70 09/17/2017   LDLDIRECT 181 (H) 04/15/2018   LDLCALC 167 (H) 05/26/2013   ALT 33 (H) 04/15/2018   AST 26 04/15/2018   NA 140 04/15/2018   K 5.2 04/15/2018   CL 104 04/15/2018   CREATININE 0.88 04/15/2018   BUN 25 04/15/2018   CO2 21 04/15/2018   TSH 2.09 04/15/2018   HGBA1C 5.9 (H) 04/15/2018   MICROALBUR <0.7 12/21/2016    Mm 3d Screen Breast Bilateral  Result Date: 11/22/2017 CLINICAL DATA:  Screening. EXAM: DIGITAL SCREENING BILATERAL MAMMOGRAM WITH TOMO AND CAD COMPARISON:  Previous exam(s). ACR Breast  Density Category c: The breast tissue is heterogeneously dense, which may obscure small masses. FINDINGS: There are no findings suspicious for malignancy. Images were processed with CAD. IMPRESSION: No mammographic evidence of malignancy. A result letter of this screening mammogram will be mailed directly to the patient. RECOMMENDATION: Screening mammogram in one year. (Code:SM-B-01Y) BI-RADS CATEGORY  1: Negative. Electronically Signed   By: Franki Cabot M.D.   On: 11/22/2017 15:19    Assessment & Plan:   Problem List Items Addressed This Visit    Diabetes mellitus type 2 in obese ([place]) - Primary     well-controlled on 12 units of Lantuss and metformin.   hemoglobin AC has been consistently at or  less than 6.5. Patient is using the freestyle Libre system to monitor blood sugars but continues to gain weight due to lack of exercise.  She is not motivated to exercise due to multiple joint issues,   she is up-to-date on eye exams and foot exam is normal today except for pes planus. . Patient has no evidence of  Microalbuminuria.  Patient is tolerating aspirin and red yeast rice  for CAD risk reduction and is already on on ACE/ARB .\  Lab Results  Component Value Date   HGBA1C 5.9 (H) 04/15/2018   Lab Results  Component Value Date   MICROALBUR  <0.7 12/21/2016         Relevant Medications   telmisartan (MICARDIS) 40 MG tablet   Other Relevant Orders   Hemoglobin A1c (Completed)   Comprehensive metabolic panel (Completed)   Direct LDL (Completed)   Essential hypertension    I am making a decision to change patient's lisinopril to telmisartan, based on increased anecdotal reports of life threateneng angioedema with ACE Inhibitors .  I also advised patient to take it at night instead of morning,  as recent studies have shown a favorable effect on CAD and CVA rates in those who do       Relevant Medications   telmisartan (MICARDIS) 40 MG tablet   Hyperlipidemia with target LDL less than 70    Managed with red yeast rice due to statin intolerance . Based on current lipid profile, the risk of clinically significant CAD is 25% over the next 10 years, using the Framingham risk calculator. The [employer] of Cardiology recommends starting patients aged 7 or higher on moderate intensity statin therapy for LDL between 70-189 and 10 yr risk of CAD > 7.5% ;  and high intensity therapy for anyone with LDL > 190. . Does not want to resume lipitor due to concern about potential side effects .  Taking an aspirin.   Lab Results  Component Value Date   CHOL 250 (H) 09/17/2017   HDL 54.70 09/17/2017   LDLCALC 167 (H) 05/26/2013   LDLDIRECT 181 (H) 04/15/2018   TRIG 241.0 (H) 09/17/2017   CHOLHDL 5 09/17/2017         Relevant Medications   telmisartan (MICARDIS) 40 MG tablet   Hypothyroidism due to acquired atrophy of thyroid    TSH is therapeutic,  Continue current levothyoxine dose,  Recheck in 6 montths    Lab Results  Component Value Date   TSH 2.09 04/15/2018         Relevant Orders   TSH (Completed)      I have discontinued Robet Leu Cyanocobalamin (VITAMIN B-12 IJ) and lisinopril. I am also having her start on Zoster Vaccine Adjuvanted and telmisartan. Additionally, I am having her maintain her aspirin,  glucose blood, co-enzyme Q-10, TURMERIC PO, SYRINGE 3CC/25GX1", fexofenadine, magnesium gluconate, amoxicillin, Blood Glucose Monitoring Suppl, Cholecalciferol, FreeStyle Libre 14 Day Reader, Insulin Glargine, gabapentin, FreeStyle Libre Sensor System, cyanocobalamin, celecoxib, levothyroxine, PARoxetine, cyclobenzaprine, and Fish Oil.  Meds ordered this encounter  Medications  . Zoster Vaccine Adjuvanted [facility]) injection    Sig: Inject 0.5 mLs into the muscle once for 1 dose.    Dispense:  1 each    Refill:  1  . telmisartan (MICARDIS) 40 MG tablet    Sig: Take 1 tablet (40 mg total) by mouth at bedtime.    Dispense:  90 tablet    Refill:  1    Medications Discontinued During This Encounter  Medication Reason  . Cyanocobalamin (VITAMIN B-12 IJ) Error  . lisinopril (PRINIVIL,ZESTRIL) 20 MG tablet     Follow-up: Return in about 6 months (around 10/16/2018).   Crecencio Mc, MD

## 2018-04-16 LAB — COMPREHENSIVE METABOLIC PANEL
AG RATIO: 1.8 (calc) (ref 1.0–2.5)
ALBUMIN MSPROF: 4.4 g/dL (ref 3.6–5.1)
ALKALINE PHOSPHATASE (APISO): 74 U/L (ref 37–153)
ALT: 33 U/L — ABNORMAL HIGH (ref 6–29)
AST: 26 U/L (ref 10–35)
BILIRUBIN TOTAL: 0.3 mg/dL (ref 0.2–1.2)
BUN: 25 mg/dL (ref 7–25)
CHLORIDE: 104 mmol/L (ref 98–110)
CO2: 21 mmol/L (ref 20–32)
Calcium: 9.9 mg/dL (ref 8.6–10.4)
Creat: 0.88 mg/dL (ref 0.60–0.93)
GLOBULIN: 2.4 g/dL (ref 1.9–3.7)
Glucose, Bld: 107 mg/dL — ABNORMAL HIGH (ref 65–99)
POTASSIUM: 5.2 mmol/L (ref 3.5–5.3)
Sodium: 140 mmol/L (ref 135–146)
Total Protein: 6.8 g/dL (ref 6.1–8.1)

## 2018-04-16 LAB — TSH: TSH: 2.09 mIU/L (ref 0.40–4.50)

## 2018-04-16 LAB — LDL CHOLESTEROL, DIRECT: Direct LDL: 181 mg/dL — ABNORMAL HIGH (ref <100)

## 2018-04-16 LAB — HEMOGLOBIN A1C
Hgb A1c MFr Bld: 5.9 % of total Hgb — ABNORMAL HIGH (ref <5.7)
Mean Plasma Glucose: 123 (calc)
eAG (mmol/L): 6.8 (calc)

## 2018-04-17 ENCOUNTER — Encounter: Payer: Self-pay | Admitting: Internal Medicine

## 2018-04-17 ENCOUNTER — Other Ambulatory Visit: Payer: Self-pay | Admitting: Internal Medicine

## 2018-04-17 DIAGNOSIS — R7401 Elevation of levels of liver transaminase levels: Secondary | ICD-10-CM

## 2018-04-17 DIAGNOSIS — R740 Nonspecific elevation of levels of transaminase and lactic acid dehydrogenase [LDH]: Principal | ICD-10-CM

## 2018-04-17 DIAGNOSIS — E785 Hyperlipidemia, unspecified: Secondary | ICD-10-CM

## 2018-04-17 MED ORDER — TELMISARTAN 40 MG PO TABS: 40.0000 mg | ORAL_TABLET | Freq: Every day | ORAL | 1 refills | Status: DC

## 2018-04-17 NOTE — Assessment & Plan Note (Signed)
TSH is therapeutic,  Continue current levothyoxine dose,  Recheck in 6 montths    Lab Results  Component Value Date   TSH 2.09 04/15/2018

## 2018-04-17 NOTE — Assessment & Plan Note (Signed)
Managed with red yeast rice due to statin intolerance . Based on current lipid profile, the risk of clinically significant CAD is 25% over the next 10 years, using the Framingham risk calculator. The [employer] of Cardiology recommends starting patients aged 79 or higher on moderate intensity statin therapy for LDL between 70-189 and 10 yr risk of CAD > 7.5% ;  and high intensity therapy for anyone with LDL > 190. . Does not want to resume lipitor due to concern about potential side effects .  Taking an aspirin.   Lab Results  Component Value Date   CHOL 250 (H) 09/17/2017   HDL 54.70 09/17/2017   LDLCALC 167 (H) 05/26/2013   LDLDIRECT 181 (H) 04/15/2018   TRIG 241.0 (H) 09/17/2017   CHOLHDL 5 09/17/2017

## 2018-04-17 NOTE — Assessment & Plan Note (Signed)
well-controlled on 12 units of Lantuss and metformin.   hemoglobin AC has been consistently at or  less than 6.5. Patient is using the freestyle Libre system to monitor blood sugars but continues to gain weight due to lack of exercise.  She is not motivated to exercise due to multiple joint issues,   she is up-to-date on eye exams and foot exam is normal today except for pes planus. . Patient has no evidence of  Microalbuminuria.  Patient is tolerating aspirin and red yeast rice  for CAD risk reduction and is already on on ACE/ARB .\  Lab Results  Component Value Date   HGBA1C 5.9 (H) 04/15/2018   Lab Results  Component Value Date   MICROALBUR <0.7 12/21/2016

## 2018-04-17 NOTE — Assessment & Plan Note (Signed)
I am making a decision to change patient's lisinopril to telmisartan, based on increased anecdotal reports of life threateneng angioedema with ACE Inhibitors .  I also advised patient to take it at night instead of morning,  as recent studies have shown a favorable effect on CAD and CVA rates in those who do  

## 2018-05-09 ENCOUNTER — Other Ambulatory Visit: Payer: Self-pay | Admitting: Internal Medicine

## 2018-06-03 ENCOUNTER — Other Ambulatory Visit: Payer: Self-pay | Admitting: Internal Medicine

## 2018-07-13 ENCOUNTER — Other Ambulatory Visit: Payer: Self-pay

## 2018-07-13 MED ORDER — TELMISARTAN 40 MG PO TABS: 40.0000 mg | ORAL_TABLET | Freq: Every day | ORAL | 1 refills | Status: DC

## 2018-08-19 ENCOUNTER — Other Ambulatory Visit: Payer: Self-pay

## 2018-08-19 ENCOUNTER — Ambulatory Visit (INDEPENDENT_AMBULATORY_CARE_PROVIDER_SITE_OTHER): Payer: Medicare Other

## 2018-08-19 ENCOUNTER — Other Ambulatory Visit: Payer: Self-pay | Admitting: Internal Medicine

## 2018-08-19 DIAGNOSIS — Z0000 Encounter for general adult medical examination without abnormal findings: Secondary | ICD-10-CM | POA: Diagnosis not present

## 2018-08-19 NOTE — Progress Notes (Signed)
Subjective:   Brenda Floyd is a 78 y.o. female who presents for Medicare Annual (Subsequent) preventive examination.  Review of Systems:  No ROS.  Medicare Wellness Virtual Visit.  Visual/audio telehealth visit, UTA vital signs.   See social history for additional risk factors.   Cardiac Risk Factors include: advanced age (>84men, >32 women);hypertension;diabetes mellitus     Objective:     Vitals: There were no vitals taken for this visit.  There is no height or weight on file to calculate BMI.  Advanced Directives 08/19/2018 08/16/2017 05/04/2017 04/06/2017 01/14/2016 12/18/2015 05/30/2015  Does Patient Have a Medical Advance Directive? Yes Yes No No No No No  Type of Paramedic of [place];Living will [place]  Does patient want to make changes to medical advance directive? - No - Patient declined - - - - -  Copy of [place] in Chart? No - copy requested No - copy requested - - - - -  Would patient like information on creating a medical advance directive? - - No - Patient declined No - Patient declined No - Patient declined No - patient declined information No - patient declined information    Tobacco Social History   Tobacco Use  Smoking Status Former Smoker  . Quit date: 10/30/1990  . Years since quitting: 27.8  Smokeless Tobacco Never Used     Counseling given: Not Answered   Clinical Intake:  Pre-visit preparation completed: Yes        Diabetes: Yes(Followed by pcp)  How often do you need to have someone help you when you read instructions, pamphlets, or other written materials from your doctor or pharmacy?: 1 - Never  Interpreter Needed?: No     Past Medical History:  Diagnosis Date  . Arthritis    neck, knees, left shoulder  . Cancer ([place])    skin ca  . Colon polyps 2012   [place], followup due 2015  . Diabetes mellitus   . Diverticulosis of sigmoid colon July 2012   by  colonoscopy  . Dyspnea   . Fracture closed, femur, shaft ([place]) remote   s/p screws and rod repair  . Hypertension   . Thyroid disease    Past Surgical History:  Procedure Laterality Date  . ABDOMINAL HYSTERECTOMY    . BREAST CYST ASPIRATION Left    neg  . CATARACT EXTRACTION W/PHACO Right 04/06/2017   Procedure: CATARACT EXTRACTION PHACO AND INTRAOCULAR LENS PLACEMENT ([place]) RIGHT DIABETIC;  Surgeon: Eulogio Bear, MD;  Location: [facility];  Service: Ophthalmology;  Laterality: Right;  . CATARACT EXTRACTION W/PHACO Left 05/04/2017   Procedure: CATARACT EXTRACTION PHACO AND INTRAOCULAR LENS PLACEMENT ([place]) LEFT DIABETIC;  Surgeon: Eulogio Bear, MD;  Location: [facility];  Service: Ophthalmology;  Laterality: Left;  DIABETIC  . JOINT REPLACEMENT     bilateral knee, 2011   Family History  Problem Relation Age of Onset  . Cancer Mother        breast cancer and colon ca  . Breast cancer Mother 64  . Cancer Father        leukemia  . Diabetes Sister   . Hypertension Sister   . Breast cancer Sister 32  . Breast cancer Maternal Aunt   . Breast cancer Cousin        maternal 1st cousin   Social History   Socioeconomic History  . Marital status: Single    Spouse  name: Not on file  . Number of children: Not on file  . Years of education: Not on file  . Highest education level: Not on file  Occupational History  . Not on file  Social Needs  . Financial resource strain: Not hard at all  . Food insecurity    Worry: Never true    Inability: Never true  . Transportation needs    Medical: No    Non-medical: No  Tobacco Use  . Smoking status: Former Smoker    Quit date: 10/30/1990    Years since quitting: 27.8  . Smokeless tobacco: Never Used  Substance and Sexual Activity  . Alcohol use: No  . Drug use: No  . Sexual activity: Never  Lifestyle  . Physical activity    Days per week: Not on file    Minutes per session: Not on file  . Stress: Not on  file  Relationships  . Social Herbalist on phone: Not on file    Gets together: Not on file    Attends religious service: Not on file    Active member of club or organization: Not on file    Attends meetings of clubs or organizations: Not on file    Relationship status: Not on file  Other Topics Concern  . Not on file  Social History Narrative  . Not on file    Outpatient Encounter Medications as of 08/19/2018  Medication Sig  . amoxicillin (AMOXIL) 500 MG capsule TAKE 4 CAPSULES BY MOUTH ONE HOUR BEFORE APPOINTMENT  . aspirin 81 MG tablet Take 1 tablet (81 mg total) by mouth daily.  . Blood Glucose Monitoring Suppl DEVI Freestyle Libre monitor  . Cholecalciferol 50000 units capsule Take 50,000 Units by mouth once a week.  . co-enzyme Q-10 30 MG capsule Take 1 capsule (30 mg total) by mouth daily.  . Continuous Blood Gluc Receiver (FREESTYLE LIBRE 14 DAY READER) DEVI 1 Units every 8 (eight) hours by Does not apply route.  . Continuous Blood Gluc Sensor (FREESTYLE LIBRE SENSOR SYSTEM) MISC Use with freestyle reader  . cyclobenzaprine (FLEXERIL) 10 MG tablet Take 1 tablet by mouth three times daily as needed for muscle spasm  . fexofenadine (ALLEGRA) 180 MG tablet Take 180 mg by mouth daily.  Marland Kitchen glucose blood (FREESTYLE LITE) test strip Use to check blood sugars once daily,  Twice if needed   DM 250.00  . Insulin Glargine (LANTUS SOLOSTAR) 100 UNIT/ML Solostar Pen INJECT 22 UNITS UNDER THE SKIN DAILY  . levothyroxine (SYNTHROID, LEVOTHROID) 75 MCG tablet TAKE 1 TABLET DAILY FOR HYPOTHYROIDISM  . magnesium gluconate (MAGONATE) 500 MG tablet Take 500 mg by mouth daily.  . Omega-3 Fatty Acids (FISH OIL) 1200 MG CAPS Take 1 capsule by mouth 2 (two) times daily.   Marland Kitchen PARoxetine (PAXIL) 20 MG tablet TAKE 1 TABLET DAILY  . Syringe/Needle, Disp, (SYRINGE 3CC/25GX1") 25G X 1" 3 ML MISC Use as directed  . telmisartan (MICARDIS) 40 MG tablet Take 1 tablet (40 mg total) by mouth at  bedtime.  . TURMERIC PO Take by mouth daily.  . [DISCONTINUED] celecoxib (CELEBREX) 200 MG capsule TAKE 1 CAPSULE BY MOUTH TWICE DAILY AS NEEDED FOR  JOINT  PAIN  . [DISCONTINUED] cyanocobalamin (,VITAMIN B-12,) 1000 MCG/ML injection INJECT 1 ML MONTHLY  . [DISCONTINUED] gabapentin (NEURONTIN) 100 MG capsule Take 1 capsule (100 mg total) by mouth 3 (three) times daily.   No facility-administered encounter medications on file as of 08/19/2018.  Activities of Daily Living In your present state of health, do you have any difficulty performing the following activities: 08/19/2018  Hearing? N  Vision? N  Difficulty concentrating or making decisions? N  Walking or climbing stairs? N  Dressing or bathing? N  Doing errands, shopping? N  Preparing Food and eating ? N  Using the Toilet? N  In the past six months, have you accidently leaked urine? N  Do you have problems with loss of bowel control? N  Managing your Medications? N  Managing your Finances? N  Housekeeping or managing your Housekeeping? N  Some recent data might be hidden    Patient Care Team: Crecencio Mc, MD as PCP - General (Internal Medicine)    Assessment:   This is a routine wellness examination for [facility].  I connected with patient 08/19/18 at 11:30 AM EDT by an audio enabled telemedicine application and verified that I am speaking with the correct person using two identifiers. Patient stated full name and DOB. Patient gave permission to continue with virtual visit. Patient's location was at home and Nurse's location was at [place] office.   Health Screenings  Mammogram - 11/2017 Colonoscopy - 12/2012 Bone Density - 08/2014 Glaucoma -none Hearing -demonstrates normal hearing during visit. Hemoglobin A1C - 04/2018 Direct LDL- 04/2018 Dental- [facility]- she plans to schedule  Social  Alcohol intake - no     Smoking history- former    Smokers in home? none Illicit drug use? none Physical activity- yard  and farm work Diet - No wheat, no grain Sexually Active -not currently BMI- discussed the importance of a healthy diet, water intake and the benefits of aerobic exercise.  Educational material provided.   Safety  Patient feels safe at home- yes Patient does have smoke detectors at home- yes Patient does wear sunscreen or protective clothing when in direct sunlight -yes Patient does wear seat belt when in a moving vehicle -yes  Covid-19 precautions and sickness symptoms discussed.   Activities of Daily Living Patient denies needing assistance with: driving, household chores, feeding themselves, getting from bed to chair, getting to the toilet, bathing/showering, dressing, managing money, or preparing meals.  No new identified risk were noted.    Depression Screen Patient denies losing interest in daily life, feeling hopeless, or crying easily over simple problems.   Medication-taking as directed and without issues.   Fall Screen Patient denies being afraid of falling or falling in the last year.   Memory Screen Patient is alert.  Patient denies difficulty focusing, concentrating or misplacing items. Correctly identified the president of the [place], season and recall. Patient likes to read and complete online quizzes for brain stimulation.  Immunizations The following Immunizations were discussed: Influenza, shingles, pneumonia, and tetanus.   Other Providers Patient Care Team: Crecencio Mc, MD as PCP - General (Internal Medicine)  Exercise Activities and Dietary recommendations Current Exercise Habits: Home exercise routine  Goals      Patient Stated   . DIET - EAT MORE FRUITS AND VEGETABLES (pt-stated)     Wheat Belly Diet       Fall Risk Fall Risk  08/19/2018 08/16/2017 01/14/2016 01/29/2015 10/21/2013  Falls in the past year? 0 No Yes Yes Yes  Number falls in past yr: - - 1 1 1   Injury with Fall? - - No No Yes  Risk for fall due to : - - - - Other (Comment)   Follow up - - Education provided;Falls prevention discussed - -  Depression Screen PHQ 2/9 Scores 08/19/2018 08/16/2017 01/14/2016 01/29/2015  PHQ - 2 Score 0 0 0 0     Cognitive Function MMSE - Mini Mental State Exam 08/16/2017  Orientation to time 5  Orientation to Place 5  Registration 3  Attention/ Calculation 5  Recall 1  Language- name 2 objects 2  Language- repeat 1  Language- follow 3 step command 3  Language- read & follow direction 1  Write a sentence 1  Copy design 1  Total score 28     6CIT Screen 08/19/2018 01/14/2016  What Year? 0 points 0 points  What month? 0 points 0 points  What time? 0 points 0 points  Count back from 20 0 points 0 points  Months in reverse 0 points 0 points  Repeat phrase 0 points 0 points  Total Score 0 0    Immunization History  Administered Date(s) Administered  . Influenza Split 09/29/2010, 11/09/2011  . Influenza,inj,Quad PF,6+ Mos 11/08/2012  . Pneumococcal Conjugate-13 02/23/2013  . Pneumococcal Polysaccharide-23 11/09/2003, 12/21/2016  . Tdap 04/15/2011  . Tetanus 11/09/2011   Screening Tests Health Maintenance  Topic Date Due  . OPHTHALMOLOGY EXAM  01/15/2018  . INFLUENZA VACCINE  12/09/2021 (Originally 09/10/2018)  . HEMOGLOBIN A1C  10/16/2018  . MAMMOGRAM  11/23/2018  . FOOT EXAM  04/15/2019  . TETANUS/TDAP  11/08/2021  . DEXA SCAN  Completed  . PNA vac Low Risk Adult  Completed      Plan:    End of life planning; Advance aging; Advanced directives discussed.  Copy of current HCPOA/Living Will requested.    I have personally reviewed and noted the following in the patient's chart:   . Medical and social history . Use of alcohol, tobacco or illicit drugs  . Current medications and supplements . Functional ability and status . Nutritional status . Physical activity . Advanced directives . List of other physicians . Hospitalizations, surgeries, and ER visits in previous 12 months . Vitals . Screenings to  include cognitive, depression, and falls . Referrals and appointments  In addition, I have reviewed and discussed with patient certain preventive protocols, quality metrics, and best practice recommendations. A written personalized care plan for preventive services as well as general preventive health recommendations were provided to patient.     OBrien-Blaney, Kaivon Livesey L, LPN  [id number]/[id number]   I have reviewed the above information and agree with above.   Deborra Medina, MD

## 2018-08-19 NOTE — Patient Instructions (Addendum)
  Ms. Suchecki , Thank you for taking time to come for your Medicare Wellness Visit. I appreciate your ongoing commitment to your health goals. Please review the following plan we discussed and let me know if I can assist you in the future.   These are the goals we discussed: Goals      Patient Stated   . DIET - EAT MORE FRUITS AND VEGETABLES (pt-stated)     Wheat Belly Diet       This is a list of the screening recommended for you and due dates:  Health Maintenance  Topic Date Due  . Eye exam for diabetics  01/15/2018  . Flu Shot  12/09/2021*  . Hemoglobin A1C  10/16/2018  . Mammogram  11/23/2018  . Complete foot exam   04/15/2019  . Tetanus Vaccine  11/08/2021  . DEXA scan (bone density measurement)  Completed  . Pneumonia vaccines  Completed  *Topic was postponed. The date shown is not the original due date.

## 2018-09-16 ENCOUNTER — Telehealth: Payer: Self-pay | Admitting: *Deleted

## 2018-09-16 NOTE — Telephone Encounter (Signed)
Copied from [place] [id number]. Topic: Quick Communication - Appointment Cancellation >> Sep 16, 2018  8:27 AM Scherrie Gerlach wrote: Patient called to cancel appointment scheduled for Monday. Pt missed your call to set up virtual.  Pt states ok to call back later, she just got up.

## 2018-09-19 ENCOUNTER — Ambulatory Visit: Payer: Medicare Other

## 2018-09-19 ENCOUNTER — Ambulatory Visit: Payer: Medicare Other | Admitting: Internal Medicine

## 2018-10-18 ENCOUNTER — Other Ambulatory Visit: Payer: Self-pay | Admitting: Internal Medicine

## 2018-10-23 ENCOUNTER — Other Ambulatory Visit: Payer: Self-pay | Admitting: Internal Medicine

## 2018-12-23 ENCOUNTER — Other Ambulatory Visit: Payer: Self-pay | Admitting: Internal Medicine

## 2019-01-11 ENCOUNTER — Other Ambulatory Visit: Payer: Self-pay | Admitting: Internal Medicine

## 2019-01-11 NOTE — Telephone Encounter (Signed)
Refilled: 03/25/2018 Last OV: 04/15/2018 Next OV: not scheduled Last TSH: 04/15/2018

## 2019-02-07 ENCOUNTER — Telehealth: Payer: Self-pay

## 2019-02-07 NOTE — Telephone Encounter (Signed)
Per written order from Dr. Derrel Nip I call [facility] to let them know that Dr. Derrel Nip would like the pt to proceed with the colonoscopy. They stated that they would call the pt to schedule the appt.

## 2019-05-25 DIAGNOSIS — K069 Disorder of gingiva and edentulous alveolar ridge, unspecified: Secondary | ICD-10-CM | POA: Insufficient documentation

## 2019-08-11 ENCOUNTER — Encounter: Payer: Self-pay | Admitting: Internal Medicine

## 2019-08-11 ENCOUNTER — Other Ambulatory Visit: Payer: Self-pay

## 2019-08-11 ENCOUNTER — Ambulatory Visit (INDEPENDENT_AMBULATORY_CARE_PROVIDER_SITE_OTHER): Payer: Medicare Other | Admitting: Internal Medicine

## 2019-08-11 VITALS — BP 138/64 | HR 72 | Temp 97.5°F | Resp 17 | Ht 64.5 in | Wt 298.0 lb

## 2019-08-11 DIAGNOSIS — M2550 Pain in unspecified joint: Secondary | ICD-10-CM

## 2019-08-11 DIAGNOSIS — E1169 Type 2 diabetes mellitus with other specified complication: Secondary | ICD-10-CM | POA: Diagnosis not present

## 2019-08-11 DIAGNOSIS — E669 Obesity, unspecified: Secondary | ICD-10-CM

## 2019-08-11 DIAGNOSIS — R5383 Other fatigue: Secondary | ICD-10-CM

## 2019-08-11 DIAGNOSIS — M25512 Pain in left shoulder: Secondary | ICD-10-CM

## 2019-08-11 DIAGNOSIS — E034 Atrophy of thyroid (acquired): Secondary | ICD-10-CM

## 2019-08-11 DIAGNOSIS — I10 Essential (primary) hypertension: Secondary | ICD-10-CM

## 2019-08-11 DIAGNOSIS — G8929 Other chronic pain: Secondary | ICD-10-CM

## 2019-08-11 DIAGNOSIS — R7401 Elevation of levels of liver transaminase levels: Secondary | ICD-10-CM

## 2019-08-11 DIAGNOSIS — G4733 Obstructive sleep apnea (adult) (pediatric): Secondary | ICD-10-CM

## 2019-08-11 LAB — POCT GLYCOSYLATED HEMOGLOBIN (HGB A1C): Hemoglobin A1C: 6 % — AB (ref 4.0–5.6)

## 2019-08-11 MED ORDER — PEN NEEDLES 31G X 6 MM MISC: 0 refills | Status: DC

## 2019-08-11 MED ORDER — SAXENDA 18 MG/3ML ~~LOC~~ SOPN: 0.6000 mg | PEN_INJECTOR | Freq: Every day | SUBCUTANEOUS | 0 refills | Status: DC

## 2019-08-11 MED ORDER — CELECOXIB 100 MG PO CAPS: 100.0000 mg | ORAL_CAPSULE | Freq: Two times a day (BID) | ORAL | 2 refills | Status: DC

## 2019-08-11 NOTE — Assessment & Plan Note (Signed)
Resolved by repeat labs in 2019. However she has had a significant weight gain; fatty liver suspected. Labs pending

## 2019-08-11 NOTE — Patient Instructions (Addendum)
celebrex 100 mg twice daily,  or 200 mg at bedtime  You can add up to 2000 mg of acetominophen (tylenol) every day safely  In divided doses (500 mg every 6 hours  Or 1000 mg every 12 hours.)  We discussed a trial of Saxenda INSTEAD of Insulin to manage your diabetes and your weight gain.  The dose is advanced weekly IF TOLERATED  Until appetite is suppressed  Liraglutide injection (Weight Management) What is this medicine? LIRAGLUTIDE (LIR a GLOO tide) is used to help people lose weight and maintain weight loss. It is used with a reduced-calorie diet and exercise. This medicine may be used for other purposes; ask your health care provider or pharmacist if you have questions. COMMON BRAND NAME(S): Saxenda What should I tell my health care provider before I take this medicine? They need to know if you have any of these conditions:  endocrine tumors (MEN 2) or if someone in your family had these tumors  gallbladder disease  high cholesterol  history of alcohol abuse problem  history of pancreatitis  kidney disease or if you are on dialysis  liver disease  previous swelling of the tongue, face, or lips with difficulty breathing, difficulty swallowing, hoarseness, or tightening of the throat  stomach problems  suicidal thoughts, plans, or attempt; a previous suicide attempt by you or a family member  thyroid cancer or if someone in your family had thyroid cancer  an unusual or allergic reaction to liraglutide, other medicines, foods, dyes, or preservatives  pregnant or trying to get pregnant  breast-feeding How should I use this medicine? This medicine is for injection under the skin of your upper leg, stomach area, or upper arm. You will be taught how to prepare and give this medicine. Use exactly as directed. Take your medicine at regular intervals. Do not take it more often than directed. This drug comes with INSTRUCTIONS FOR USE. Ask your pharmacist for directions on how to  use this drug. Read the information carefully. Talk to your pharmacist or health care provider if you have questions. It is important that you put your used needles and syringes in a special sharps container. Do not put them in a trash can. If you do not have a sharps container, call your pharmacist or healthcare provider to get one. A special MedGuide will be given to you by the pharmacist with each prescription and refill. Be sure to read this information carefully each time. Talk to your pediatrician regarding the use of this medicine in children. Special care may be needed. Overdosage: If you think you have taken too much of this medicine contact a poison control center or emergency room at once. NOTE: This medicine is only for you. Do not share this medicine with others. What if I miss a dose? If you miss a dose, take it as soon as you can. If it is almost time for your next dose, take only that dose. Do not take double or extra doses. If you miss your dose for 3 days or more, call your doctor or health care professional to talk about how to restart this medicine. What may interact with this medicine?  insulin and other medicines for diabetes This list may not describe all possible interactions. Give your health care provider a list of all the medicines, herbs, non-prescription drugs, or dietary supplements you use. Also tell them if you smoke, drink alcohol, or use illegal drugs. Some items may interact with your medicine. What should I  watch for while using this medicine? Visit your doctor or health care professional for regular checks on your progress. Drink plenty of fluids while taking this medicine. Check with your doctor or health care professional if you get an attack of severe diarrhea, nausea, and vomiting. The loss of too much body fluid can make it dangerous for you to take this medicine. This medicine may affect blood sugar levels. Ask your healthcare provider if changes in diet or  medicines are needed if you have diabetes. Patients and their families should watch out for worsening depression or thoughts of suicide. Also watch out for sudden changes in feelings such as feeling anxious, agitated, panicky, irritable, hostile, aggressive, impulsive, severely restless, overly excited and hyperactive, or not being able to sleep. If this happens, especially at the beginning of treatment or after a change in dose, call your health care professional. Women should inform their health care provider if they wish to become pregnant or think they might be pregnant. Losing weight while pregnant is not advised and may cause harm to the unborn child. Talk to your health care provider for more information. What side effects may I notice from receiving this medicine? Side effects that you should report to your doctor or health care professional as soon as possible:  allergic reactions like skin rash, itching or hives, swelling of the face, lips, or tongue  breathing problems  diarrhea that continues or is severe  lump or swelling on the neck  severe nausea  signs and symptoms of infection like fever or chills; cough; sore throat; pain or trouble passing urine  signs and symptoms of low blood sugar such as feeling anxious; confusion; dizziness; increased hunger; unusually weak or tired; increased sweating; shakiness; cold, clammy skin; irritable; headache; blurred vision; fast heartbeat; loss of consciousness  signs and symptoms of kidney injury like trouble passing urine or change in the amount of urine  trouble swallowing  unusual stomach upset or pain  vomiting Side effects that usually do not require medical attention (report to your doctor or health care professional if they continue or are bothersome):  constipation  decreased appetite  diarrhea  fatigue  headache  nausea  pain, redness, or irritation at site where injected  stomach upset  stuffy or runny  nose This list may not describe all possible side effects. Call your doctor for medical advice about side effects. You may report side effects to FDA at 1-800-FDA-1088. Where should I keep my medicine? Keep out of the reach of children. Store unopened pen in a refrigerator between 2 and 8 degrees C (36 and 46 degrees F). Do not freeze or use if the medicine has been frozen. Protect from light and excessive heat. After you first use the pen, it can be stored at room temperature between 15 and 30 degrees C (59 and 86 degrees F) or in a refrigerator. Throw away your used pen after 30 days or after the expiration date, whichever comes first. Do not store your pen with the needle attached. If the needle is left on, medicine may leak from the pen. NOTE: This sheet is a summary. It may not cover all possible information. If you have questions about this medicine, talk to your doctor, pharmacist, or health care provider.  2020 Elsevier/Gold Standard (2018-12-01 21:16:59)

## 2019-08-11 NOTE — Progress Notes (Signed)
Subjective:  Patient ID: Brenda Floyd, female    DOB: 04/04/1940  Age: 79 y.o. MRN: [id number]  CC: The primary encounter diagnosis was Diabetes mellitus type 2 in obese ([place]). Diagnoses of Essential hypertension, Hypothyroidism due to acquired atrophy of thyroid, Fatigue, unspecified type, Elevated ALT measurement, OSA (obstructive sleep apnea), Chronic left shoulder pain, Morbid obesity ([place]), and Polyarthralgia were also pertinent to this visit.  HPI Brenda Floyd presents for follow up on well controlled type 2 DM. Last labs done  March 2020  This visit occurred during the SARS-CoV-2 public health emergency.  Safety protocols were in place, including screening questions prior to the visit, additional usage of staff PPE, and extensive cleaning of exam room while observing appropriate contact time as indicated for disinfecting solutions.     Has not had colonoscopy by choice due to fear of COVID testing. Last one was w/o polyps Has stopped having mammograms due to left arm limited ROM   T2DM:  She  feels generally well,  But is not  exercising or trying to lose weight. Checking  blood sugars  Once or twice  daily at variable times, usually only if she feels she may be having a hypoglycemic event. .  BS have been under 130 fasting and < 180 post prandially.  Denies any recent hypoglyemic events.  Taking   22 units of Lantus. . Following a carbohydrate modified diet 6 days per week. Denies numbness, burning and tingling of extremities. Appetite is good.   Morbid obesity/OSA:  Using CPAP averaging 6 to 8 hours per night of use .Weight gain of 26 lb s due to diet and lack of exercise.  Back pain,  Ankle pain.    left shoulder hurts constantly has been taking tylenol and ASA without relief    Lab Results  Component Value Date   HGBA1C 6.0 (A) 08/11/2019     Outpatient Medications Prior to Visit  Medication Sig Dispense Refill  . amoxicillin (AMOXIL) 500 MG capsule TAKE 4  CAPSULES BY MOUTH ONE HOUR BEFORE APPOINTMENT 4 capsule 0  . aspirin 81 MG tablet Take 1 tablet (81 mg total) by mouth daily. 30 tablet 11  . Blood Glucose Monitoring Suppl DEVI Freestyle Libre monitor 1 each 0  . Cholecalciferol 50000 units capsule Take 50,000 Units by mouth once a week.    . co-enzyme Q-10 30 MG capsule Take 1 capsule (30 mg total) by mouth daily. 90 capsule 3  . Continuous Blood Gluc Receiver (FREESTYLE LIBRE 14 DAY READER) DEVI 1 Units every 8 (eight) hours by Does not apply route. 1 Device 0  . Continuous Blood Gluc Sensor (FREESTYLE LIBRE 14 DAY SENSOR) MISC USE WITH FREESTYLE READER (CHANGE SENSOR AFTER NO MORE THAN 14 DAYS) 6 each 3  . fexofenadine (ALLEGRA) 180 MG tablet Take 180 mg by mouth daily.    Marland Kitchen glucose blood (FREESTYLE LITE) test strip Use to check blood sugars once daily,  Twice if needed   DM 250.00 102 each 3  . Insulin Glargine (LANTUS SOLOSTAR) 100 UNIT/ML Solostar Pen INJECT 22 UNITS UNDER THE SKIN DAILY 15 mL 4  . levothyroxine (SYNTHROID) 75 MCG tablet TAKE 1 TABLET DAILY FOR HYPOTHYROIDISM 90 tablet 3  . magnesium gluconate (MAGONATE) 500 MG tablet Take 500 mg by mouth daily.    . Omega-3 Fatty Acids (FISH OIL) 1200 MG CAPS Take 1 capsule by mouth 2 (two) times daily.     Marland Kitchen PARoxetine (PAXIL) 20 MG tablet TAKE 1  TABLET DAILY 90 tablet 3  . Syringe/Needle, Disp, (SYRINGE 3CC/25GX1") 25G X 1" 3 ML MISC Use as directed 50 each 0  . telmisartan (MICARDIS) 40 MG tablet TAKE 1 TABLET AT BEDTIME 90 tablet 3  . TURMERIC PO Take by mouth daily.    . cyclobenzaprine (FLEXERIL) 10 MG tablet Take 1 tablet by mouth three times daily as needed for muscle spasm 90 tablet 1   No facility-administered medications prior to visit.    Review of Systems;  Patient denies headache, fevers, malaise, unintentional weight loss, skin rash, eye pain, sinus congestion and sinus pain, sore throat, dysphagia,  hemoptysis , cough, dyspnea, wheezing, chest pain, palpitations,  orthopnea, edema, abdominal pain, nausea, melena, diarrhea, constipation, flank pain, dysuria, hematuria, urinary  Frequency, nocturia, numbness, tingling, seizures,  Focal weakness, Loss of consciousness,  Tremor, insomnia, depression, anxiety, and suicidal ideation.      Objective:  BP 138/64 (BP Location: Left Arm, Patient Position: Sitting, Cuff Size: Large)   Pulse 72   Temp (!) 97.5 F (36.4 C) (Temporal)   Resp 17   Ht 5' 4.5" (1.638 m)   Wt 298 lb (135.2 kg)   SpO2 93%   BMI 50.36 kg/m   BP Readings from Last 3 Encounters:  08/11/19 138/64  04/15/18 122/88  10/18/17 136/82    Wt Readings from Last 3 Encounters:  08/11/19 298 lb (135.2 kg)  04/15/18 273 lb 12.8 oz (124.2 kg)  10/18/17 273 lb 9.6 oz (124.1 kg)    General appearance: alert, cooperative and appears stated age Ears: normal TM's and external ear canals both ears Throat: lips, mucosa, and tongue normal; teeth and gums normal Neck: no adenopathy, no carotid bruit, supple, symmetrical, trachea midline and thyroid not enlarged, symmetric, no tenderness/mass/nodules Back: symmetric, no curvature. ROM normal. No CVA tenderness. Lungs: clear to auscultation bilaterally Heart: regular rate and rhythm, S1, S2 normal, no murmur, click, rub or gallop Abdomen: soft, non-tender; bowel sounds normal; no masses,  no organomegaly Pulses: 2+ and symmetric Skin: Skin color, texture, turgor normal. No rashes or lesions Lymph nodes: Cervical, supraclavicular, and axillary nodes normal.  Lab Results  Component Value Date   HGBA1C 6.0 (A) 08/11/2019   HGBA1C 5.9 (H) 04/15/2018   HGBA1C 6.1 09/17/2017    Lab Results  Component Value Date   CREATININE 0.75 08/11/2019   CREATININE 0.88 04/15/2018   CREATININE 0.88 09/17/2017    Lab Results  Component Value Date   WBC CANCELED 08/11/2019   HGB 13.5 08/08/2012   HCT 41.2 08/08/2012   PLT 216.0 08/08/2012   GLUCOSE 188 (H) 08/11/2019   CHOL 243 (H) 08/11/2019    TRIG 365 (H) 08/11/2019   HDL 53 08/11/2019   LDLDIRECT 148 (H) 08/11/2019   LDLCALC 134 (H) 08/11/2019   ALT 47 (H) 08/11/2019   AST 38 (H) 08/11/2019   NA 139 08/11/2019   K 4.5 08/11/2019   CL 106 08/11/2019   CREATININE 0.75 08/11/2019   BUN 16 08/11/2019   CO2 26 08/11/2019   TSH 2.35 08/11/2019   HGBA1C 6.0 (A) 08/11/2019   MICROALBUR <0.7 12/21/2016    MM 3D SCREEN BREAST BILATERAL  Result Date: 11/22/2017 CLINICAL DATA:  Screening. EXAM: DIGITAL SCREENING BILATERAL MAMMOGRAM WITH TOMO AND CAD COMPARISON:  Previous exam(s). ACR Breast Density Category c: The breast tissue is heterogeneously dense, which may obscure small masses. FINDINGS: There are no findings suspicious for malignancy. Images were processed with CAD. IMPRESSION: No mammographic evidence of malignancy. A  result letter of this screening mammogram will be mailed directly to the patient. RECOMMENDATION: Screening mammogram in one year. (Code:SM-B-01Y) BI-RADS CATEGORY  1: Negative. Electronically Signed   By: Franki Cabot M.D.   On: 11/22/2017 15:19    Assessment & Plan:   Problem List Items Addressed This Visit      Unprioritized   Diabetes mellitus type 2 in obese ([place]) - Primary     well-controlled on 12 units of Lantus and metformin.   hemoglobin AC has been consistently at or  less than 6.5. Patient is using the freestyle Libre system to monitor blood sugars but continues to gain weight due to lack of exercise.  She is not motivated to exercise due to multiple joint issues,   she is up-to-date on eye exams and foot exam is normal today except for pes planus. . Patient has no evidence of  Microalbuminuria.  Patient is tolerating aspirin and red yeast rice  for CAD risk reduction and is already on on ACE/ARB .\  Lab Results  Component Value Date   HGBA1C 6.0 (A) 08/11/2019   Lab Results  Component Value Date   MICROALBUR <0.7 12/21/2016         Relevant Orders   POCT HgB A1C (Completed)   Lipid  panel (Completed)   Direct LDL (Completed)   Microalbumin / creatinine urine ratio   Essential hypertension    Well controlled on current regimen. Renal function stable, no changes today.  Lab Results  Component Value Date   CREATININE 0.75 08/11/2019   Lab Results  Component Value Date   NA 139 08/11/2019   K 4.5 08/11/2019   CL 106 08/11/2019   CO2 26 08/11/2019         Relevant Orders   Comprehensive metabolic panel (Completed)   Morbid obesity ([place])    Significant weight  Gain durid COVID 19  Pandemic acknowledged. Secondary to increased appetite and sedentary lifestyle.  Trial of saxenda discussed as an alternative to insulin and as a preferred pharmacologic agent to reduce appetite.  Starting dose 0.6 mg daily and increase weekly as tolerated,  To max dose 3 mg weekly.   I have addressed  BMI and recommended wt loss of 10% of body weigh over the next 6 months using a low glycemic index diet and regular exercise a minimum of 5 days per week.        Relevant Medications   Liraglutide -Weight Management (SAXENDA) 18 MG/3ML SOPN   Hypothyroidism due to acquired atrophy of thyroid   Relevant Orders   TSH (Completed)   Chronic left shoulder pain    Adding celebrex 200 mg daily,  Tylenol       Relevant Medications   celecoxib (CELEBREX) 100 MG capsule   Elevated ALT measurement    Resolved by repeat labs in 2019. However she has had a significant weight gain; fatty liver suspected. Labs pending       OSA (obstructive sleep apnea)    Diagnosed by sleep study. She is wearing her CPAP every night a minimum of 6 hours per night and notes improved daytime wakefulness and decreased fatigue       Polyarthralgia    Secondary to DJD and OA>  Trial of celebrex 100 mg bid and tylenol 2000 mg daily        Other Visit Diagnoses    Fatigue, unspecified type       Relevant Orders   CBC with Differential/Platelet (Completed)  I have discontinued Brenda Floyd's  cyclobenzaprine. I am also having her start on celecoxib, Saxenda, and Pen Needles. Additionally, I am having her maintain her aspirin, glucose blood, co-enzyme Q-10, TURMERIC PO, SYRINGE 3CC/25GX1", fexofenadine, magnesium gluconate, amoxicillin, Blood Glucose Monitoring Suppl, Cholecalciferol, FreeStyle Libre 14 Day Reader, Fish Oil, Lantus SoloStar, FreeStyle Libre 14 Day Sensor, PARoxetine, telmisartan, and levothyroxine.  Meds ordered this encounter  Medications  . celecoxib (CELEBREX) 100 MG capsule    Sig: Take 1 capsule (100 mg total) by mouth 2 (two) times daily.    Dispense:  60 capsule    Refill:  2  . Liraglutide -Weight Management (SAXENDA) 18 MG/3ML SOPN    Sig: Inject 0.1 mLs (0.6 mg total) into the skin daily. Increase dose weekly as follows: Week 2: 1.2 mg daily ; Week 3: 1.8 mg daily; Week 4: 2.4 mg daily    Dispense:  9 mL    Refill:  0  . Insulin Pen Needle (PEN NEEDLES) 31G X 6 MM MISC    Sig: For use with victoza /saxenda    Dispense:  100 each    Refill:  0    Medications Discontinued During This Encounter  Medication Reason  . cyclobenzaprine (FLEXERIL) 10 MG tablet Patient has not taken in last 30 days    Follow-up: No follow-ups on file.   Crecencio Mc, MD

## 2019-08-11 NOTE — Assessment & Plan Note (Signed)
Adding celebrex 200 mg daily,  Tylenol

## 2019-08-11 NOTE — Assessment & Plan Note (Signed)
Diagnosed by sleep study. She is wearing her CPAP every night a minimum of 6 hours per night and notes improved daytime wakefulness and decreased fatigue  

## 2019-08-12 LAB — COMPREHENSIVE METABOLIC PANEL
AG Ratio: 1.6 (calc) (ref 1.0–2.5)
ALT: 47 U/L — ABNORMAL HIGH (ref 6–29)
AST: 38 U/L — ABNORMAL HIGH (ref 10–35)
Albumin: 4 g/dL (ref 3.6–5.1)
Alkaline phosphatase (APISO): 73 U/L (ref 37–153)
BUN: 16 mg/dL (ref 7–25)
CO2: 26 mmol/L (ref 20–32)
Calcium: 9.1 mg/dL (ref 8.6–10.4)
Chloride: 106 mmol/L (ref 98–110)
Creat: 0.75 mg/dL (ref 0.60–0.88)
Globulin: 2.5 g/dL (calc) (ref 1.9–3.7)
Glucose, Bld: 188 mg/dL — ABNORMAL HIGH (ref 65–99)
Potassium: 4.5 mmol/L (ref 3.5–5.3)
Sodium: 139 mmol/L (ref 135–146)
Total Bilirubin: 0.4 mg/dL (ref 0.2–1.2)
Total Protein: 6.5 g/dL (ref 6.1–8.1)

## 2019-08-12 LAB — LDL CHOLESTEROL, DIRECT: Direct LDL: 148 mg/dL — ABNORMAL HIGH (ref <100)

## 2019-08-12 LAB — LIPID PANEL
Cholesterol: 243 mg/dL — ABNORMAL HIGH (ref <200)
HDL: 53 mg/dL (ref >=50)
LDL Cholesterol (Calc): 134 mg/dL (calc) — ABNORMAL HIGH
Non-HDL Cholesterol (Calc): 190 mg/dL (calc) — ABNORMAL HIGH (ref <130)
Total CHOL/HDL Ratio: 4.6 (calc) (ref <5.0)
Triglycerides: 365 mg/dL — ABNORMAL HIGH (ref <150)

## 2019-08-12 LAB — CBC WITH DIFFERENTIAL/PLATELET

## 2019-08-12 LAB — TSH: TSH: 2.35 mIU/L (ref 0.40–4.50)

## 2019-08-13 ENCOUNTER — Other Ambulatory Visit: Payer: Self-pay | Admitting: Internal Medicine

## 2019-08-13 DIAGNOSIS — M2550 Pain in unspecified joint: Secondary | ICD-10-CM | POA: Insufficient documentation

## 2019-08-13 DIAGNOSIS — R748 Abnormal levels of other serum enzymes: Secondary | ICD-10-CM

## 2019-08-13 DIAGNOSIS — E785 Hyperlipidemia, unspecified: Secondary | ICD-10-CM

## 2019-08-13 NOTE — Assessment & Plan Note (Signed)
Secondary to DJD and OA>  Trial of celebrex 100 mg bid and tylenol 2000 mg daily

## 2019-08-13 NOTE — Assessment & Plan Note (Addendum)
Significant weight  Gain durid COVID 19  Pandemic acknowledged. Secondary to increased appetite and sedentary lifestyle.  Trial of saxenda discussed as an alternative to insulin and as a preferred pharmacologic agent to reduce appetite.  Starting dose 0.6 mg daily and increase weekly as tolerated,  To max dose 3 mg weekly.   I have addressed  BMI and recommended wt loss of 10% of body weigh over the next 6 months using a low glycemic index diet and regular exercise a minimum of 5 days per week.

## 2019-08-13 NOTE — Assessment & Plan Note (Signed)
well-controlled on 12 units of Lantus and metformin.   hemoglobin AC has been consistently at or  less than 6.5. Patient is using the freestyle Libre system to monitor blood sugars but continues to gain weight due to lack of exercise.  She is not motivated to exercise due to multiple joint issues,   she is up-to-date on eye exams and foot exam is normal today except for pes planus. . Patient has no evidence of  Microalbuminuria.  Patient is tolerating aspirin and red yeast rice  for CAD risk reduction and is already on on ACE/ARB .\  Lab Results  Component Value Date   HGBA1C 6.0 (A) 08/11/2019   Lab Results  Component Value Date   MICROALBUR <0.7 12/21/2016

## 2019-08-13 NOTE — Assessment & Plan Note (Signed)
Well controlled on current regimen. Renal function stable, no changes today.  Lab Results  Component Value Date   CREATININE 0.75 08/11/2019   Lab Results  Component Value Date   NA 139 08/11/2019   K 4.5 08/11/2019   CL 106 08/11/2019   CO2 26 08/11/2019

## 2019-08-22 ENCOUNTER — Ambulatory Visit: Payer: Medicare Other

## 2019-08-28 ENCOUNTER — Other Ambulatory Visit: Payer: Self-pay

## 2019-08-28 MED ORDER — CELECOXIB 100 MG PO CAPS: 100.0000 mg | ORAL_CAPSULE | Freq: Two times a day (BID) | ORAL | 1 refills | Status: DC

## 2019-09-07 ENCOUNTER — Other Ambulatory Visit: Payer: Self-pay | Admitting: Internal Medicine

## 2019-10-06 ENCOUNTER — Ambulatory Visit: Payer: Medicare Other

## 2019-10-18 ENCOUNTER — Other Ambulatory Visit: Payer: Self-pay | Admitting: Internal Medicine

## 2019-10-24 ENCOUNTER — Other Ambulatory Visit: Payer: Self-pay | Admitting: Internal Medicine

## 2019-11-13 ENCOUNTER — Ambulatory Visit (INDEPENDENT_AMBULATORY_CARE_PROVIDER_SITE_OTHER): Payer: Medicare Other

## 2019-11-13 VITALS — Ht 64.5 in | Wt 298.0 lb

## 2019-11-13 DIAGNOSIS — Z0000 Encounter for general adult medical examination without abnormal findings: Secondary | ICD-10-CM | POA: Diagnosis not present

## 2019-11-13 NOTE — Progress Notes (Addendum)
Subjective:   Brenda Floyd is a 79 y.o. female who presents for Medicare Annual (Subsequent) preventive examination.  Review of Systems    No ROS.  Medicare Wellness Virtual Visit.    Cardiac Risk Factors include: advanced age (>4men, >64 women)     Objective:    Today's Vitals   11/13/19 1336  Weight: 298 lb (135.2 kg)  Height: 5' 4.5" (1.638 m)   Body mass index is 50.36 kg/m.  Advanced Directives 11/13/2019 08/19/2018 08/16/2017 05/04/2017 04/06/2017 01/14/2016 12/18/2015  Does Patient Have a Medical Advance Directive? No Yes Yes No No No No  Type of Advance Directive - [place];Living will [place]  Does patient want to make changes to medical advance directive? - - No - Patient declined - - - -  Copy of [place] in Chart? - No - copy requested No - copy requested - - - -  Would patient like information on creating a medical advance directive? No - Patient declined - - No - Patient declined No - Patient declined No - Patient declined No - patient declined information    Current Medications (verified) Outpatient Encounter Medications as of 11/13/2019  Medication Sig   amoxicillin (AMOXIL) 500 MG capsule TAKE 4 CAPSULES BY MOUTH ONE HOUR BEFORE APPOINTMENT   aspirin 81 MG tablet Take 1 tablet (81 mg total) by mouth daily.   Blood Glucose Monitoring Suppl DEVI Freestyle Libre monitor   celecoxib (CELEBREX) 100 MG capsule Take 1 capsule (100 mg total) by mouth 2 (two) times daily.   Cholecalciferol 50000 units capsule Take 50,000 Units by mouth once a week.   co-enzyme Q-10 30 MG capsule Take 1 capsule (30 mg total) by mouth daily.   Continuous Blood Gluc Receiver (FREESTYLE LIBRE 14 DAY READER) DEVI 1 Units every 8 (eight) hours by Does not apply route.   Continuous Blood Gluc Sensor (FREESTYLE LIBRE 14 DAY SENSOR) MISC USE WITH FREESTYLE READER (CHANGE SENSOR AFTER NO MORE THAN 14 DAYS)   fexofenadine  (ALLEGRA) 180 MG tablet Take 180 mg by mouth daily.   glucose blood (FREESTYLE LITE) test strip Use to check blood sugars once daily,  Twice if needed   DM 250.00   Insulin Pen Needle (PEN NEEDLES) 31G X 6 MM MISC For use with victoza /saxenda   LANTUS SOLOSTAR 100 UNIT/ML Solostar Pen INJECT 22 UNITS UNDER THE SKIN DAILY (Patient not taking: Reported on 11/13/2019)   levothyroxine (SYNTHROID) 75 MCG tablet TAKE 1 TABLET DAILY FOR HYPOTHYROIDISM   magnesium gluconate (MAGONATE) 500 MG tablet Take 500 mg by mouth daily.   Omega-3 Fatty Acids (FISH OIL) 1200 MG CAPS Take 1 capsule by mouth 2 (two) times daily.    PARoxetine (PAXIL) 20 MG tablet TAKE 1 TABLET DAILY   Syringe/Needle, Disp, (SYRINGE 3CC/25GX1") 25G X 1" 3 ML MISC Use as directed   telmisartan (MICARDIS) 40 MG tablet TAKE 1 TABLET AT BEDTIME   TURMERIC PO Take by mouth daily.   No facility-administered encounter medications on file as of 11/13/2019.    Allergies (verified) Crestor [rosuvastatin]   History: Past Medical History:  Diagnosis Date   Arthritis    neck, knees, left shoulder   Cancer ([place])    skin ca   Colon polyps 2012   [place], followup due 2015   Diabetes mellitus    Diverticulosis of sigmoid colon July 2012   by colonoscopy   Dyspnea  Fracture closed, femur, shaft ([place]) remote   s/p screws and rod repair   Hypertension    Thyroid disease    Past Surgical History:  Procedure Laterality Date   ABDOMINAL HYSTERECTOMY     BREAST CYST ASPIRATION Left    neg   CATARACT EXTRACTION W/PHACO Right 04/06/2017   Procedure: CATARACT EXTRACTION PHACO AND INTRAOCULAR LENS PLACEMENT ([place]) RIGHT DIABETIC;  Surgeon: Eulogio Bear, MD;  Location: [facility];  Service: Ophthalmology;  Laterality: Right;   CATARACT EXTRACTION W/PHACO Left 05/04/2017   Procedure: CATARACT EXTRACTION PHACO AND INTRAOCULAR LENS PLACEMENT ([place]) LEFT DIABETIC;  Surgeon: Eulogio Bear, MD;  Location: [facility];   Service: Ophthalmology;  Laterality: Left;  DIABETIC   JOINT REPLACEMENT     bilateral knee, 2011   Family History  Problem Relation Age of Onset   Cancer Mother        breast cancer and colon ca   Breast cancer Mother 8   Cancer Father        leukemia   Diabetes Sister    Hypertension Sister    Breast cancer Sister 53   Breast cancer Maternal Aunt    Breast cancer Cousin        maternal 1st cousin   Social History   Socioeconomic History   Marital status: Single    Spouse name: Not on file   Number of children: Not on file   Years of education: Not on file   Highest education level: Not on file  Occupational History   Not on file  Tobacco Use   Smoking status: Former Smoker    Quit date: 10/30/1990    Years since quitting: 29.0   Smokeless tobacco: Never Used  Substance and Sexual Activity   Alcohol use: No   Drug use: No   Sexual activity: Never  Other Topics Concern   Not on file  Social History Narrative   Not on file   Social Determinants of Health   Financial Resource Strain: Low Risk    Difficulty of Paying Living Expenses: Not hard at all  Food Insecurity: No Food Insecurity   Worried About Charity fundraiser in the Last Year: Never true   [place] in the Last Year: Never true  Transportation Needs: No Transportation Needs   Lack of Transportation (Medical): No   Lack of Transportation (Non-Medical): No  Physical Activity:    Days of Exercise per Week: Not on file   Minutes of Exercise per Session: Not on file  Stress: No Stress Concern Present   Feeling of Stress : Not at all  Social Connections:    Frequency of Communication with Friends and Family: Not on file   Frequency of Social Gatherings with Friends and Family: Not on file   Attends Religious Services: Not on file   Active Member of Clubs or Organizations: Not on file   Attends Archivist Meetings: Not on file   Marital Status: Not on file    Tobacco  Counseling Counseling given: Not Answered   Clinical Intake:  Pre-visit preparation completed: Yes        Diabetes: No  How often do you need to have someone help you when you read instructions, pamphlets, or other written materials from your doctor or pharmacy?: 1 - Never     Activities of Daily Living In your present state of health, do you have any difficulty performing the following activities: 11/13/2019  Hearing? N  Vision? N  Difficulty concentrating or making decisions? N  Walking or climbing stairs? N  Dressing or bathing? N  Doing errands, shopping? N  Preparing Food and eating ? N  Using the Toilet? N  In the past six months, have you accidently leaked urine? N  Do you have problems with loss of bowel control? N  Managing your Medications? N  Managing your Finances? N  Housekeeping or managing your Housekeeping? N  Some recent data might be hidden    Patient Care Team: Crecencio Mc, MD as PCP - General (Internal Medicine)  Indicate any recent Medical Services you may have received from other than [facility] providers in the past year (date may be approximate).     Assessment:   This is a routine wellness examination for [facility].  I connected with Mar today by telephone and verified that I am speaking with the correct person using two identifiers. Location patient: home Location provider: work Persons participating in the virtual visit: patient, Marine scientist.    I discussed the limitations, risks, security and privacy concerns of performing an evaluation and management service by telephone and the availability of in person appointments. I also discussed with the patient that there may be a patient responsible charge related to this service. The patient expressed understanding and verbally consented to this telephonic visit.    Interactive audio and video telecommunications were attempted between this provider and patient, however failed, due to patient having  technical difficulties OR patient did not have access to video capability.  We continued and completed visit with audio only.  Some vital signs may be absent or patient reported.   Hearing/Vision screen  Hearing Screening   125Hz  250Hz  500Hz  1000Hz  2000Hz  3000Hz  4000Hz  6000Hz  8000Hz   Right ear:           Left ear:           Comments: Patient has difficulty hearing conversational tones in a crowd.  She does not wear hearing aids.   Vision Screening Comments: Cataract extraction, bilateral  Visual acuity not assessed, virtual visit.   Dietary issues and exercise activities discussed:   Regular diet Good water intake  Goals       Patient Stated     DIET - EAT MORE FRUITS AND VEGETABLES (pt-stated)      I would like to lose weight       Depression Screen PHQ 2/9 Scores 11/13/2019 08/19/2018 08/16/2017 01/14/2016 01/29/2015 10/21/2013 10/18/2013  PHQ - 2 Score 0 0 0 0 0 0 0    Fall Risk Fall Risk  11/13/2019 08/11/2019 08/19/2018 08/16/2017 01/14/2016  Falls in the past year? 0 0 0 No Yes  Number falls in past yr: - - - - 1  Injury with Fall? - - - - No  Risk for fall due to : - - - - -  Follow up Falls evaluation completed Falls evaluation completed - - Education provided;Falls prevention discussed   ASSISTIVE DEVICES UTILIZED TO PREVENT FALLS: Use of a cane, walker or w/c? No   TIMED UP AND GO: Was the test performed? No . Virtual visit.   Cognitive Function: Patient is alert and oriented x3.  Denies difficulty focusing, making decisions, memory loss.  Enjoys reading and brain challenging activities online.   MMSE - Mini Mental State Exam 08/16/2017  Orientation to time 5  Orientation to Place 5  Registration 3  Attention/ Calculation 5  Recall 1  Language- name 2 objects 2  Language- repeat  1  Language- follow 3 step command 3  Language- read & follow direction 1  Write a sentence 1  Copy design 1  Total score 28     6CIT Screen 08/19/2018 01/14/2016  What Year? 0  points 0 points  What month? 0 points 0 points  What time? 0 points 0 points  Count back from 20 0 points 0 points  Months in reverse 0 points 0 points  Repeat phrase 0 points 0 points  Total Score 0 0    Immunizations Immunization History  Administered Date(s) Administered   Influenza Split 09/29/2010, 11/09/2011   Influenza,inj,Quad PF,6+ Mos 11/08/2012   Pneumococcal Conjugate-13 02/23/2013   Pneumococcal Polysaccharide-23 11/09/2003, 12/21/2016   Tdap 04/15/2011   Tetanus 11/09/2011   Health Maintenance Health Maintenance  Topic Date Due   OPHTHALMOLOGY EXAM  11/13/2019 (Originally 01/15/2018)   COVID-19 Vaccine (1) 11/29/2019 (Originally 07/30/1952)   MAMMOGRAM  11/12/2020 (Originally 11/23/2018)   INFLUENZA VACCINE  12/09/2021 (Originally 09/10/2019)   HEMOGLOBIN A1C  02/11/2020   FOOT EXAM  08/10/2020   TETANUS/TDAP  11/08/2021   DEXA SCAN  Completed   PNA vac Low Risk Adult  Completed   Dental Screening: Recommended annual dental exams for proper oral hygiene  Community Resource Referral / Chronic Care Management: CRR required this visit?  No   CCM required this visit?  No      Plan:   Encouraged to schedule routine maintenance follow up with primary care provider.  I have personally reviewed and noted the following in the patient's chart:   Medical and social history Use of alcohol, tobacco or illicit drugs  Current medications and supplements Functional ability and status Nutritional status Physical activity Advanced directives List of other physicians Hospitalizations, surgeries, and ER visits in previous 12 months Vitals Screenings to include cognitive, depression, and falls Referrals and appointments  In addition, I have reviewed and discussed with patient certain preventive protocols, quality metrics, and best practice recommendations. A written personalized care plan for preventive services as well as general preventive health recommendations were  provided to patient via mychart.     OBrien-Blaney, Malachy Coleman L, LPN   [id number]     I have reviewed the above information and agree with above.   Deborra Medina, MD

## 2019-11-13 NOTE — Patient Instructions (Addendum)
Brenda Floyd , Thank you for taking time to come for your Medicare Wellness Visit. I appreciate your ongoing commitment to your health goals. Please review the following plan we discussed and let me know if I can assist you in the future.   These are the goals we discussed: Goals      Patient Stated   .  DIET - EAT MORE FRUITS AND VEGETABLES (pt-stated)      I would like to lose weight       This is a list of the screening recommended for you and due dates:  Health Maintenance  Topic Date Due  . Eye exam for diabetics  11/13/2019*  . COVID-19 Vaccine (1) 11/29/2019*  . Mammogram  11/12/2020*  . Flu Shot  12/09/2021*  . Hemoglobin A1C  02/11/2020  . Complete foot exam   08/10/2020  . Tetanus Vaccine  11/08/2021  . DEXA scan (bone density measurement)  Completed  . Pneumonia vaccines  Completed  *Topic was postponed. The date shown is not the original due date.   Advanced directives: not yet completed  Conditions/risks identified: none new  Follow up in one year for your annual wellness visit.   Preventive Care 13 Years and Older, Female Preventive care refers to lifestyle choices and visits with your health care provider that can promote health and wellness. What does preventive care include?  A yearly physical exam. This is also called an annual well check.  Dental exams once or twice a year.  Routine eye exams. Ask your health care provider how often you should have your eyes checked.  Personal lifestyle choices, including:  Daily care of your teeth and gums.  Regular physical activity.  Eating a healthy diet.  Avoiding tobacco and drug use.  Limiting alcohol use.  Practicing safe sex.  Taking low-dose aspirin every day.  Taking vitamin and mineral supplements as recommended by your health care provider. What happens during an annual well check? The services and screenings done by your health care provider during your annual well check will depend on  your age, overall health, lifestyle risk factors, and family history of disease. Counseling  Your health care provider may ask you questions about your:  Alcohol use.  Tobacco use.  Drug use.  Emotional well-being.  Home and relationship well-being.  Sexual activity.  Eating habits.  History of falls.  Memory and ability to understand (cognition).  Work and work Statistician.  Reproductive health. Screening  You may have the following tests or measurements:  Height, weight, and BMI.  Blood pressure.  Lipid and cholesterol levels. These may be checked every 5 years, or more frequently if you are over 82 years old.  Skin check.  Lung cancer screening. You may have this screening every year starting at age 29 if you have a 30-pack-year history of smoking and currently smoke or have quit within the past 15 years.  Fecal occult blood test (FOBT) of the stool. You may have this test every year starting at age 74.  Flexible sigmoidoscopy or colonoscopy. You may have a sigmoidoscopy every 5 years or a colonoscopy every 10 years starting at age 23.  Hepatitis C blood test.  Hepatitis B blood test.  Sexually transmitted disease (STD) testing.  Diabetes screening. This is done by checking your blood sugar (glucose) after you have not eaten for a while (fasting). You may have this done every 1-3 years.  Bone density scan. This is done to screen for osteoporosis. You may  have this done starting at age 46.  Mammogram. This may be done every 1-2 years. Talk to your health care provider about how often you should have regular mammograms. Talk with your health care provider about your test results, treatment options, and if necessary, the need for more tests. Vaccines  Your health care provider may recommend certain vaccines, such as:  Influenza vaccine. This is recommended every year.  Tetanus, diphtheria, and acellular pertussis (Tdap, Td) vaccine. You may need a Td booster  every 10 years.  Zoster vaccine. You may need this after age 61.  Pneumococcal 13-valent conjugate (PCV13) vaccine. One dose is recommended after age 61.  Pneumococcal polysaccharide (PPSV23) vaccine. One dose is recommended after age 11. Talk to your health care provider about which screenings and vaccines you need and how often you need them. This information is not intended to replace advice given to you by your health care provider. Make sure you discuss any questions you have with your health care provider. Document Released: 02/22/2015 Document Revised: 10/16/2015 Document Reviewed: 11/27/2014 Elsevier Interactive Patient Education  2017 [place] Prevention in the Home Falls can cause injuries. They can happen to people of all ages. There are many things you can do to make your home safe and to help prevent falls. What can I do on the outside of my home?  Regularly fix the edges of walkways and driveways and fix any cracks.  Remove anything that might make you trip as you walk through a door, such as a raised step or threshold.  Trim any bushes or trees on the path to your home.  Use bright outdoor lighting.  Clear any walking paths of anything that might make someone trip, such as rocks or tools.  Regularly check to see if handrails are loose or broken. Make sure that both sides of any steps have handrails.  Any raised decks and porches should have guardrails on the edges.  Have any leaves, snow, or ice cleared regularly.  Use sand or salt on walking paths during winter.  Clean up any spills in your garage right away. This includes oil or grease spills. What can I do in the bathroom?  Use night lights.  Install grab bars by the toilet and in the tub and shower. Do not use towel bars as grab bars.  Use non-skid mats or decals in the tub or shower.  If you need to sit down in the shower, use a plastic, non-slip stool.  Keep the floor dry. Clean up any  water that spills on the floor as soon as it happens.  Remove soap buildup in the tub or shower regularly.  Attach bath mats securely with double-sided non-slip rug tape.  Do not have throw rugs and other things on the floor that can make you trip. What can I do in the bedroom?  Use night lights.  Make sure that you have a light by your bed that is easy to reach.  Do not use any sheets or blankets that are too big for your bed. They should not hang down onto the floor.  Have a firm chair that has side arms. You can use this for support while you get dressed.  Do not have throw rugs and other things on the floor that can make you trip. What can I do in the kitchen?  Clean up any spills right away.  Avoid walking on wet floors.  Keep items that you use a lot in  easy-to-reach places.  If you need to reach something above you, use a strong step stool that has a grab bar.  Keep electrical cords out of the way.  Do not use floor polish or wax that makes floors slippery. If you must use wax, use non-skid floor wax.  Do not have throw rugs and other things on the floor that can make you trip. What can I do with my stairs?  Do not leave any items on the stairs.  Make sure that there are handrails on both sides of the stairs and use them. Fix handrails that are broken or loose. Make sure that handrails are as long as the stairways.  Check any carpeting to make sure that it is firmly attached to the stairs. Fix any carpet that is loose or worn.  Avoid having throw rugs at the top or bottom of the stairs. If you do have throw rugs, attach them to the floor with carpet tape.  Make sure that you have a light switch at the top of the stairs and the bottom of the stairs. If you do not have them, ask someone to add them for you. What else can I do to help prevent falls?  Wear shoes that:  Do not have high heels.  Have rubber bottoms.  Are comfortable and fit you well.  Are closed  at the toe. Do not wear sandals.  If you use a stepladder:  Make sure that it is fully opened. Do not climb a closed stepladder.  Make sure that both sides of the stepladder are locked into place.  Ask someone to hold it for you, if possible.  Clearly mark and make sure that you can see:  Any grab bars or handrails.  First and last steps.  Where the edge of each step is.  Use tools that help you move around (mobility aids) if they are needed. These include:  Canes.  Walkers.  Scooters.  Crutches.  Turn on the lights when you go into a dark area. Replace any light bulbs as soon as they burn out.  Set up your furniture so you have a clear path. Avoid moving your furniture around.  If any of your floors are uneven, fix them.  If there are any pets around you, be aware of where they are.  Review your medicines with your doctor. Some medicines can make you feel dizzy. This can increase your chance of falling. Ask your doctor what other things that you can do to help prevent falls. This information is not intended to replace advice given to you by your health care provider. Make sure you discuss any questions you have with your health care provider. Document Released: 11/22/2008 Document Revised: 07/04/2015 Document Reviewed: 03/02/2014 Elsevier Interactive Patient Education  2017 [employer].

## 2019-12-18 ENCOUNTER — Other Ambulatory Visit: Payer: Self-pay | Admitting: Internal Medicine

## 2020-03-27 ENCOUNTER — Other Ambulatory Visit: Payer: Self-pay | Admitting: Internal Medicine

## 2020-08-19 DIAGNOSIS — Z20822 Contact with and (suspected) exposure to covid-19: Secondary | ICD-10-CM | POA: Diagnosis not present

## 2020-11-13 ENCOUNTER — Ambulatory Visit: Payer: Medicare Other

## 2020-11-13 ENCOUNTER — Ambulatory Visit (INDEPENDENT_AMBULATORY_CARE_PROVIDER_SITE_OTHER): Payer: Medicare Other

## 2020-11-13 VITALS — Ht 64.5 in | Wt 298.0 lb

## 2020-11-13 DIAGNOSIS — Z0000 Encounter for general adult medical examination without abnormal findings: Secondary | ICD-10-CM | POA: Diagnosis not present

## 2020-11-13 NOTE — Progress Notes (Addendum)
Subjective:   Brenda Floyd is a 80 y.o. female who presents for Medicare Annual (Subsequent) preventive examination.  Review of Systems    No ROS.  Medicare Wellness Virtual Visit.  Visual/audio telehealth visit, UTA vital signs.   See social history for additional risk factors.   Cardiac Risk Factors include: advanced age (>29men, >74 women)     Objective:    Today's Vitals   11/13/20 1318  Weight: 298 lb (135.2 kg)  Height: 5' 4.5" (1.638 m)   Body mass index is 50.36 kg/m.  Advanced Directives 11/13/2020 11/13/2019 08/19/2018 08/16/2017 05/04/2017 04/06/2017 01/14/2016  Does Patient Have a Medical Advance Directive? Yes No Yes Yes No No No  Type of [employer] [place];Living will [place] - - -  Does patient want to make changes to medical advance directive? No - Patient declined - - No - Patient declined - - -  Copy of [place] in Chart? No - copy requested - No - copy requested No - copy requested - - -  Would patient like information on creating a medical advance directive? - No - Patient declined - - No - Patient declined No - Patient declined No - Patient declined    Current Medications (verified) Outpatient Encounter Medications as of 11/13/2020  Medication Sig   amoxicillin (AMOXIL) 500 MG capsule TAKE 4 CAPSULES BY MOUTH ONE HOUR BEFORE APPOINTMENT   aspirin 81 MG tablet Take 1 tablet (81 mg total) by mouth daily.   Blood Glucose Monitoring Suppl DEVI Freestyle Libre monitor   celecoxib (CELEBREX) 100 MG capsule TAKE 1 CAPSULE TWICE A DAY   Cholecalciferol 50000 units capsule Take 50,000 Units by mouth once a week.   co-enzyme Q-10 30 MG capsule Take 1 capsule (30 mg total) by mouth daily.   Continuous Blood Gluc Receiver (FREESTYLE LIBRE 14 DAY READER) DEVI 1 Units every 8 (eight) hours by Does not apply route.   Continuous Blood Gluc Sensor (FREESTYLE LIBRE 14 DAY  SENSOR) MISC USE WITH FREESTYLE READER (CHANGE SENSOR AFTER NO MORE THAN 14 DAYS)   fexofenadine (ALLEGRA) 180 MG tablet Take 180 mg by mouth daily.   glucose blood (FREESTYLE LITE) test strip Use to check blood sugars once daily,  Twice if needed   DM 250.00   Insulin Pen Needle (PEN NEEDLES) 31G X 6 MM MISC For use with victoza /saxenda   LANTUS SOLOSTAR 100 UNIT/ML Solostar Pen INJECT 22 UNITS UNDER THE SKIN DAILY (Patient not taking: Reported on 11/13/2019)   levothyroxine (SYNTHROID) 75 MCG tablet TAKE 1 TABLET DAILY FOR HYPOTHYROIDISM   magnesium gluconate (MAGONATE) 500 MG tablet Take 500 mg by mouth daily.   Omega-3 Fatty Acids (FISH OIL) 1200 MG CAPS Take 1 capsule by mouth 2 (two) times daily.    PARoxetine (PAXIL) 20 MG tablet TAKE 1 TABLET DAILY   Syringe/Needle, Disp, (SYRINGE 3CC/25GX1") 25G X 1" 3 ML MISC Use as directed   telmisartan (MICARDIS) 40 MG tablet TAKE 1 TABLET AT BEDTIME   TURMERIC PO Take by mouth daily.   No facility-administered encounter medications on file as of 11/13/2020.    Allergies (verified) Crestor [rosuvastatin]   History: Past Medical History:  Diagnosis Date   Arthritis    neck, knees, left shoulder   Cancer ([place])    skin ca   Colon polyps 2012   [place], followup due 2015   Diabetes mellitus    Diverticulosis  of sigmoid colon July 2012   by colonoscopy   Dyspnea    Fracture closed, femur, shaft ([place]) remote   s/p screws and rod repair   Hypertension    Thyroid disease    Past Surgical History:  Procedure Laterality Date   ABDOMINAL HYSTERECTOMY     BREAST CYST ASPIRATION Left    neg   CATARACT EXTRACTION W/PHACO Right 04/06/2017   Procedure: CATARACT EXTRACTION PHACO AND INTRAOCULAR LENS PLACEMENT ([place]) RIGHT DIABETIC;  Surgeon: Eulogio Bear, MD;  Location: [facility];  Service: Ophthalmology;  Laterality: Right;   CATARACT EXTRACTION W/PHACO Left 05/04/2017   Procedure: CATARACT EXTRACTION PHACO AND INTRAOCULAR LENS  PLACEMENT ([place]) LEFT DIABETIC;  Surgeon: Eulogio Bear, MD;  Location: [facility];  Service: Ophthalmology;  Laterality: Left;  DIABETIC   JOINT REPLACEMENT     bilateral knee, 2011   Family History  Problem Relation Age of Onset   Cancer Mother        breast cancer and colon ca   Breast cancer Mother 54   Cancer Father        leukemia   Diabetes Sister    Hypertension Sister    Breast cancer Sister 81   Breast cancer Maternal Aunt    Breast cancer Cousin        maternal 1st cousin   Social History   Socioeconomic History   Marital status: Single    Spouse name: Not on file   Number of children: Not on file   Years of education: Not on file   Highest education level: Not on file  Occupational History   Not on file  Tobacco Use   Smoking status: Former    Types: Cigarettes    Quit date: 10/30/1990    Years since quitting: 30.0   Smokeless tobacco: Never  Substance and Sexual Activity   Alcohol use: No   Drug use: No   Sexual activity: Never  Other Topics Concern   Not on file  Social History Narrative   Not on file   Social Determinants of Health   Financial Resource Strain: Low Risk    Difficulty of Paying Living Expenses: Not hard at all  Food Insecurity: No Food Insecurity   Worried About Charity fundraiser in the Last Year: Never true   [place] in the Last Year: Never true  Transportation Needs: No Transportation Needs   Lack of Transportation (Medical): No   Lack of Transportation (Non-Medical): No  Physical Activity: Not on file  Stress: No Stress Concern Present   Feeling of Stress : Not at all  Social Connections: Unknown   Frequency of Communication with Friends and Family: Not on file   Frequency of Social Gatherings with Friends and Family: Not on file   Attends Religious Services: Not on file   Active Member of Clubs or Organizations: Not on file   Attends Archivist Meetings: Not on file   Marital Status:  Married    Tobacco Counseling Counseling given: Not Answered   Clinical Intake:  Pre-visit preparation completed: Yes    Nutrition Risk Assessment: Has the patient had any N/V/D within the last 2 weeks?  No  Does the patient have any non-healing wounds?  No  Has the patient had any unintentional weight loss or weight gain?  No   Financial Strains and Diabetes Management: Are you having any financial strains with the device, your supplies or your medication? No .  Does the patient want to be seen by Chronic Care Management for management of their diabetes?  No  Would the patient like to be referred to a Nutritionist or for Diabetic Management?  No     Diabetes: Yes (Followed by pcp)  How often do you need to have someone help you when you read instructions, pamphlets, or other written materials from your doctor or pharmacy?: 1 - Never    Interpreter Needed?: No      Activities of Daily Living In your present state of health, do you have any difficulty performing the following activities: 11/13/2020  Hearing? Y  Vision? N  Difficulty concentrating or making decisions? N  Walking or climbing stairs? N  Dressing or bathing? N  Doing errands, shopping? N  Preparing Food and eating ? N  Using the Toilet? N  In the past six months, have you accidently leaked urine? N  Do you have problems with loss of bowel control? N  Managing your Medications? N  Managing your Finances? N  Housekeeping or managing your Housekeeping? N  Some recent data might be hidden    Patient Care Team: Crecencio Mc, MD as PCP - General (Internal Medicine)  Indicate any recent Medical Services you may have received from other than [facility] providers in the past year (date may be approximate).     Assessment:   This is a routine wellness examination for [facility].  I connected with Brenda Floyd today by telephone and verified that I am speaking with the correct person using two  identifiers. Location patient: home Location provider: work Persons participating in the virtual visit: patient, Marine scientist.    I discussed the limitations, risks, security and privacy concerns of performing an evaluation and management service by telephone and the availability of in person appointments. The patient expressed understanding and verbally consented to this telephonic visit.    Interactive audio and video telecommunications were attempted between this provider and patient, however failed, due to patient having technical difficulties OR patient did not have access to video capability.  We continued and completed visit with audio only.  Some vital signs may be absent or patient reported.   Hearing/Vision screen Hearing Screening - Comments::  Patient has difficulty hearing conversational tones in a crowd. Wears hearing aids sometimes. Vision Screening - Comments:: Followed by [facility]  Diabetic eye exam; no retinopathy  Cataract extraction, bilateral  They have regular follow up with the ophthalmologist  Dietary issues and exercise activities discussed: Current Exercise Habits: Home exercise routine, Intensity: Mild Regular diet   Goals Addressed               This Visit's Progress     Patient Stated     DIET - EAT MORE FRUITS AND VEGETABLES (pt-stated)        I would like to lose a little more weight       Depression Screen PHQ 2/9 Scores 11/13/2020 11/13/2019 08/19/2018 08/16/2017 01/14/2016 01/29/2015 10/21/2013  PHQ - 2 Score 0 0 0 0 0 0 0    Fall Risk Fall Risk  11/13/2020 11/13/2019 08/11/2019 08/19/2018 08/16/2017  Falls in the past year? 0 0 0 0 No  Number falls in past yr: 0 - - - -  Injury with Fall? - - - - -  Risk for fall due to : - - - - -  Follow up Falls evaluation completed Falls evaluation completed Falls evaluation completed - -    FALL RISK PREVENTION PERTAINING  TO THE HOME: Adequate lighting in your home to reduce risk of falls? Yes    ASSISTIVE DEVICES UTILIZED TO PREVENT FALLS: Use of a cane, walker or w/c? No   TIMED UP AND GO: Was the test performed? No .   Cognitive Function: The patient has not given up any activities day living, she is able to operate a motor vehicle without difficulty.  She is able to do the finances and keep up with medications and appointments.  she denies misplacing things about the house frequently.   MMSE - Mini Mental State Exam 08/16/2017  Orientation to time 5  Orientation to Place 5  Registration 3  Attention/ Calculation 5  Recall 1  Language- name 2 objects 2  Language- repeat 1  Language- follow 3 step command 3  Language- read & follow direction 1  Write a sentence 1  Copy design 1  Total score 28     6CIT Screen 08/19/2018 01/14/2016  What Year? 0 points 0 points  What month? 0 points 0 points  What time? 0 points 0 points  Count back from 20 0 points 0 points  Months in reverse 0 points 0 points  Repeat phrase 0 points 0 points  Total Score 0 0    Immunizations Immunization History  Administered Date(s) Administered   Influenza Split 09/29/2010, 11/09/2011   Influenza,inj,Quad PF,6+ Mos 11/08/2012   Pneumococcal Conjugate-13 02/23/2013   Pneumococcal Polysaccharide-23 11/09/2003, 12/21/2016   Tdap 04/15/2011   Tetanus 11/09/2011   Health Maintenance Health Maintenance  Topic Date Due   OPHTHALMOLOGY EXAM  11/13/2020 (Originally 01/15/2018)   HEMOGLOBIN A1C  12/14/2020 (Originally 02/11/2020)   FOOT EXAM  11/13/2021 (Originally 08/10/2020)   MAMMOGRAM  11/13/2021 (Originally 11/23/2018)   INFLUENZA VACCINE  12/09/2021 (Originally 09/09/2020)   TETANUS/TDAP  11/08/2021   DEXA SCAN  Completed   HPV VACCINES  Aged Out   COVID-19 Vaccine  Discontinued   Zoster Vaccines- Shingrix  Discontinued   Mammogram- due. Plans to schedule later in the season after shoulder heals. Deferred.   Foot exam- denies changes, wounds. Followed by pcp. Deferred.   Lung Cancer  Screening: (Low Dose CT Chest recommended if Age 50-80 years, 30 pack-year currently smoking OR have quit w/in 15years.) does not qualify.   Hepatitis C Screening: does not qualify.  Vision Screening: Recommended annual ophthalmology exams for early detection of glaucoma and other disorders of the eye.  Dental Screening: Recommended annual dental exams for proper oral hygiene  Community Resource Referral / Chronic Care Management: CRR required this visit?  No   CCM required this visit?  No      Plan:   Encouraged to schedule annual follow up with pcp.  A1C deferred for scheduling with next office visit.   I have personally reviewed and noted the following in the patient's chart:   Medical and social history Use of alcohol, tobacco or illicit drugs  Current medications and supplements including opioid prescriptions. Not taking opioid.  Functional ability and status Nutritional status Physical activity Advanced directives List of other physicians Hospitalizations, surgeries, and ER visits in previous 12 months Vitals Screenings to include cognitive, depression, and falls Referrals and appointments  In addition, I have reviewed and discussed with patient certain preventive protocols, quality metrics, and best practice recommendations. A written personalized care plan for preventive services as well as general preventive health recommendations were provided to patient.     Varney Biles, LPN   06/11/9765  I have reviewed the above information and agree with above.   Deborra Medina, MD

## 2020-11-13 NOTE — Patient Instructions (Addendum)
  Brenda Floyd , Thank you for taking time to come for your Medicare Wellness Visit. I appreciate your ongoing commitment to your health goals. Please review the following plan we discussed and let me know if I can assist you in the future.   These are the goals we discussed:  Goals       Patient Stated     DIET - EAT MORE FRUITS AND VEGETABLES (pt-stated)      I would like to lose a little more weight        This is a list of the screening recommended for you and due dates:  Health Maintenance  Topic Date Due   Eye exam for diabetics  11/13/2020*   Hemoglobin A1C  12/14/2020*   Complete foot exam   11/13/2021*   Mammogram  11/13/2021*   Flu Shot  12/09/2021*   Tetanus Vaccine  11/08/2021   DEXA scan (bone density measurement)  Completed   HPV Vaccine  Aged Out   COVID-19 Vaccine  Discontinued   Zoster (Shingles) Vaccine  Discontinued  *Topic was postponed. The date shown is not the original due date.

## 2020-12-12 ENCOUNTER — Other Ambulatory Visit: Payer: Self-pay | Admitting: Internal Medicine

## 2020-12-26 ENCOUNTER — Other Ambulatory Visit: Payer: Self-pay | Admitting: Internal Medicine

## 2021-03-19 DIAGNOSIS — Z20822 Contact with and (suspected) exposure to covid-19: Secondary | ICD-10-CM | POA: Diagnosis not present

## 2021-05-07 DIAGNOSIS — Z20822 Contact with and (suspected) exposure to covid-19: Secondary | ICD-10-CM | POA: Diagnosis not present

## 2021-05-08 DIAGNOSIS — Z20828 Contact with and (suspected) exposure to other viral communicable diseases: Secondary | ICD-10-CM | POA: Diagnosis not present

## 2021-05-15 DIAGNOSIS — Z20822 Contact with and (suspected) exposure to covid-19: Secondary | ICD-10-CM | POA: Diagnosis not present

## 2021-05-19 DIAGNOSIS — Z20828 Contact with and (suspected) exposure to other viral communicable diseases: Secondary | ICD-10-CM | POA: Diagnosis not present

## 2021-05-28 ENCOUNTER — Encounter: Payer: Self-pay | Admitting: Internal Medicine

## 2021-06-03 MED ORDER — DEXCOM G7 SENSOR MISC: 0 refills | Status: DC

## 2021-06-03 MED ORDER — DEXCOM G7 RECEIVER DEVI: 0 refills | Status: DC

## 2021-06-05 ENCOUNTER — Ambulatory Visit (INDEPENDENT_AMBULATORY_CARE_PROVIDER_SITE_OTHER): Payer: Medicare Other | Admitting: Internal Medicine

## 2021-06-05 ENCOUNTER — Encounter: Payer: Self-pay | Admitting: Internal Medicine

## 2021-06-05 VITALS — BP 118/78 | HR 91 | Temp 97.8°F | Ht 64.5 in | Wt 284.6 lb

## 2021-06-05 DIAGNOSIS — M5412 Radiculopathy, cervical region: Secondary | ICD-10-CM

## 2021-06-05 DIAGNOSIS — R5383 Other fatigue: Secondary | ICD-10-CM | POA: Diagnosis not present

## 2021-06-05 DIAGNOSIS — E034 Atrophy of thyroid (acquired): Secondary | ICD-10-CM | POA: Diagnosis not present

## 2021-06-05 DIAGNOSIS — E1169 Type 2 diabetes mellitus with other specified complication: Secondary | ICD-10-CM

## 2021-06-05 DIAGNOSIS — E785 Hyperlipidemia, unspecified: Secondary | ICD-10-CM

## 2021-06-05 DIAGNOSIS — I10 Essential (primary) hypertension: Secondary | ICD-10-CM

## 2021-06-05 DIAGNOSIS — E669 Obesity, unspecified: Secondary | ICD-10-CM

## 2021-06-05 LAB — CBC WITH DIFFERENTIAL/PLATELET
Basophils Absolute: 0 10*3/uL (ref 0.0–0.1)
Basophils Relative: 1 % (ref 0.0–3.0)
Eosinophils Absolute: 0.2 10*3/uL (ref 0.0–0.7)
Eosinophils Relative: 5 % (ref 0.0–5.0)
HCT: 33.1 % — ABNORMAL LOW (ref 36.0–46.0)
Hemoglobin: 10.8 g/dL — ABNORMAL LOW (ref 12.0–15.0)
Lymphocytes Relative: 27.9 % (ref 12.0–46.0)
Lymphs Abs: 1.3 10*3/uL (ref 0.7–4.0)
MCHC: 32.6 g/dL (ref 30.0–36.0)
MCV: 98 fl (ref 78.0–100.0)
Monocytes Absolute: 0.4 10*3/uL (ref 0.1–1.0)
Monocytes Relative: 9.2 % (ref 3.0–12.0)
Neutro Abs: 2.6 10*3/uL (ref 1.4–7.7)
Neutrophils Relative %: 56.9 % (ref 43.0–77.0)
Platelets: 221 10*3/uL (ref 150.0–400.0)
RBC: 3.38 Mil/uL — ABNORMAL LOW (ref 3.87–5.11)
RDW: 16.3 % — ABNORMAL HIGH (ref 11.5–15.5)
WBC: 4.5 10*3/uL (ref 4.0–10.5)

## 2021-06-05 LAB — COMPREHENSIVE METABOLIC PANEL
ALT: 60 U/L — ABNORMAL HIGH (ref 0–35)
AST: 62 U/L — ABNORMAL HIGH (ref 0–37)
Albumin: 4.3 g/dL (ref 3.5–5.2)
Alkaline Phosphatase: 73 U/L (ref 39–117)
BUN: 16 mg/dL (ref 6–23)
CO2: 26 mEq/L (ref 19–32)
Calcium: 9.6 mg/dL (ref 8.4–10.5)
Chloride: 99 mEq/L (ref 96–112)
Creatinine, Ser: 0.81 mg/dL (ref 0.40–1.20)
GFR: 67.4 mL/min (ref >60.00)
Glucose, Bld: 230 mg/dL — ABNORMAL HIGH (ref 70–99)
Potassium: 5.1 mEq/L (ref 3.5–5.1)
Sodium: 135 mEq/L (ref 135–145)
Total Bilirubin: 0.5 mg/dL (ref 0.2–1.2)
Total Protein: 7.2 g/dL (ref 6.0–8.3)

## 2021-06-05 LAB — MICROALBUMIN / CREATININE URINE RATIO
Creatinine,U: 92.7 mg/dL
Microalb Creat Ratio: 1.2 mg/g (ref 0.0–30.0)
Microalb, Ur: 1.1 mg/dL (ref 0.0–1.9)

## 2021-06-05 LAB — LIPID PANEL
Cholesterol: 242 mg/dL — ABNORMAL HIGH (ref 0–200)
HDL: 52.4 mg/dL (ref >39.00)
NonHDL: 189.96
Total CHOL/HDL Ratio: 5
Triglycerides: 260 mg/dL — ABNORMAL HIGH (ref 0.0–149.0)
VLDL: 52 mg/dL — ABNORMAL HIGH (ref 0.0–40.0)

## 2021-06-05 LAB — LDL CHOLESTEROL, DIRECT: Direct LDL: 170 mg/dL

## 2021-06-05 LAB — TSH: TSH: 6.33 u[IU]/mL — ABNORMAL HIGH (ref 0.35–5.50)

## 2021-06-05 MED ORDER — CELECOXIB 100 MG PO CAPS: 100.0000 mg | ORAL_CAPSULE | Freq: Two times a day (BID) | ORAL | 1 refills | Status: DC

## 2021-06-05 MED ORDER — OMEPRAZOLE 40 MG PO CPDR: 40.0000 mg | DELAYED_RELEASE_CAPSULE | Freq: Every day | ORAL | 3 refills | Status: DC

## 2021-06-05 MED ORDER — OZEMPIC (0.25 OR 0.5 MG/DOSE) 2 MG/1.5ML ~~LOC~~ SOPN: 0.5000 mg | PEN_INJECTOR | SUBCUTANEOUS | 1 refills | Status: DC

## 2021-06-05 NOTE — Assessment & Plan Note (Signed)
With left shoulder pain .  Last PT was with Donalee Citrin at [facility]  ?

## 2021-06-05 NOTE — Patient Instructions (Addendum)
I recommend calling Pricilla Riffle, PT, DPT [facility]   ?Ph [phone]   Fax [phone]  [phone]   ? ?Call him first .  ? ? ?I am  recommending adding Ozempic  to help you lose weight   ? ?ozempic is a medication that is taken as a weekly subcutaneous injection. It is not insulin.  It  causes your pancreas to increase its  own insulin secretion  And also slows down the emptying of your stomach,  So it decreases your appetite and helps you lose weight. ? ?The dose for the first 4 weekly doses is 0.25 mg.  You may have mild nausea on the first or second day but this should resolve.  If not  ,  stop the medication.  ? ?As long as you are losing weight,  you can continue the dose you are on .  Only increase the dose to 0.5 mg after 4 weeks if your weight has plateaued.  Let me know when you need a refill and what dose you are taking.   ? ? ?Call  the  [phone]  to set up your free diabetic eye exam  appointment  May 18 at our clinic.  ? ?

## 2021-06-05 NOTE — Progress Notes (Signed)
? ?Subjective:  ?Patient ID: Brenda Floyd, female    DOB: 09-Jan-1941  Age: 81 y.o. MRN: [id number] ? ?CC: The primary encounter diagnosis was Essential hypertension. Diagnoses of Diabetes mellitus type 2 in obese [facility]), Hypothyroidism due to acquired atrophy of thyroid, Hyperlipidemia with target LDL less than 70, Fatigue, unspecified type, Cervical radiculopathy at C5, and Morbid obesity ([place]) were also pertinent to this visit. ? ? ?This visit occurred during the SARS-CoV-2 public health emergency.  Safety protocols were in place, including screening questions prior to the visit, additional usage of staff PPE, and extensive cleaning of exam room while observing appropriate contact time as indicated for disinfecting solutions.   ? ?HPI ?[name] presents for  ?Chief Complaint  ?Patient presents with  ? Shoulder Pain  ?  Left shoulder pain  ? Follow-up  ?  Follow up on diabetes  ?She has been lost t follow up  for over a year.  Last OV July 2021n ? ?1) left shoulder arthritis :  chronic and multifactorial .  [facility]ain films in 2018 at [facility] as not seen [place] In years . Using ibuprofen or aspirin since running of celebrex several months ago .  Aggravated by sleeping on her side,  and by picking up bags of 50 lbs of farm animal food  ? ?2) type 2 DM with obesity:  she has stopped using insulin and has been using a supplement called berberine ,  has not been checking sugars .   Discussed use of Ozempic .  Poc a1c is 9.0 today she declines insulin and prefers to tighten her glycemic control with diet, had not had diabetic eye exam  ? ?Outpatient Medications Prior to Visit  ?Medication Sig Dispense Refill  ? aspirin 81 MG tablet Take 1 tablet (81 mg total) by mouth daily. 30 tablet 11  ? Barberry-Oreg Grape-Goldenseal (BERBERINE COMPLEX PO) Take 1 capsule by mouth 3 (three) times daily before meals.    ? Cholecalciferol 50000 units capsule Take 50,000 Units by mouth once a week.    ? Continuous Blood  Gluc Receiver (DEXCOM G7 RECEIVER) DEVI Use to check blood sugars. 1 each 0  ? Continuous Blood Gluc Sensor (DEXCOM G7 SENSOR) MISC Use with Dexcom Receiver. Change sensor every 14 days. 2 each 0  ? fexofenadine (ALLEGRA) 180 MG tablet Take 180 mg by mouth daily.    ? glucose blood (FREESTYLE LITE) test strip Use to check blood sugars once daily,  Twice if needed   DM 250.00 102 each 3  ? magnesium gluconate (MAGONATE) 500 MG tablet Take 500 mg by mouth daily.    ? PARoxetine (PAXIL) 20 MG tablet TAKE 1 TABLET DAILY 90 tablet 3  ? telmisartan (MICARDIS) 40 MG tablet TAKE 1 TABLET AT BEDTIME 90 tablet 3  ? TURMERIC PO Take by mouth daily.    ? celecoxib (CELEBREX) 100 MG capsule TAKE 1 CAPSULE TWICE A DAY 180 capsule 1  ? levothyroxine (SYNTHROID) 75 MCG tablet TAKE 1 TABLET DAILY FOR HYPOTHYROIDISM 90 tablet 3  ? amoxicillin (AMOXIL) 500 MG capsule TAKE 4 CAPSULES BY MOUTH ONE HOUR BEFORE APPOINTMENT (Patient not taking: Reported on 06/05/2021) 4 capsule 0  ? Blood Glucose Monitoring Suppl DEVI Freestyle Libre monitor 1 each 0  ? co-enzyme Q-10 30 MG capsule Take 1 capsule (30 mg total) by mouth daily. 90 capsule 3  ? Continuous Blood Gluc Receiver (FREESTYLE LIBRE 14 DAY READER) DEVI 1 Units every 8 (eight) hours by Does not  apply route. 1 Device 0  ? Continuous Blood Gluc Sensor (FREESTYLE LIBRE 14 DAY SENSOR) MISC USE WITH FREESTYLE READER (CHANGE SENSOR AFTER NO MORE THAN 14 DAYS) 6 each 3  ? Insulin Pen Needle (PEN NEEDLES) 31G X 6 MM MISC For use with victoza /saxenda 100 each 0  ? LANTUS SOLOSTAR 100 UNIT/ML Solostar Pen INJECT 22 UNITS UNDER THE SKIN DAILY (Patient not taking: Reported on 11/13/2019) 15 mL 4  ? Omega-3 Fatty Acids (FISH OIL) 1200 MG CAPS Take 1 capsule by mouth 2 (two) times daily.     ? Syringe/Needle, Disp, (SYRINGE 3CC/25GX1") 25G X 1" 3 ML MISC Use as directed 50 each 0  ? ?No facility-administered medications prior to visit.  ? ? ?Review of Systems; ? ?Patient denies headache, fevers,  malaise, unintentional weight loss, skin rash, eye pain, sinus congestion and sinus pain, sore throat, dysphagia,  hemoptysis , cough, dyspnea, wheezing, chest pain, palpitations, orthopnea, edema, abdominal pain, nausea, melena, diarrhea, constipation, flank pain, dysuria, hematuria, urinary  Frequency, nocturia, numbness, tingling, seizures,  Focal weakness, Loss of consciousness,  Tremor, insomnia, depression, anxiety, and suicidal ideation.   ? ? ? ?Objective:  ?BP 118/78 (BP Location: Left Arm, Patient Position: Sitting, Cuff Size: Normal)   Pulse 91   Temp 97.8 ?F (36.6 ?C) (Oral)   Ht 5' 4.5" (1.638 m)   Wt 284 lb 9.6 oz (129.1 kg)   SpO2 97%   BMI 48.10 kg/m?  ? ?BP Readings from Last 3 Encounters:  ?06/05/21 118/78  ?08/11/19 138/64  ?04/15/18 122/88  ? ? ?Wt Readings from Last 3 Encounters:  ?06/05/21 284 lb 9.6 oz (129.1 kg)  ?11/13/20 298 lb (135.2 kg)  ?11/13/19 298 lb (135.2 kg)  ? ? ?General appearance: alert, cooperative and appears stated age ?Ears: normal TM's and external ear canals both ears ?Throat: lips, mucosa, and tongue normal; teeth and gums normal ?Neck: no adenopathy, no carotid bruit, supple, symmetrical, trachea midline and thyroid not enlarged, symmetric, no tenderness/mass/nodules ?Back: symmetric, no curvature. ROM normal. No CVA tenderness. ?Lungs: clear to auscultation bilaterally ?Heart: regular rate and rhythm, S1, S2 normal, no murmur, click, rub or gallop ?Abdomen: soft, non-tender; bowel sounds normal; no masses,  no organomegaly ?Pulses: 2+ and symmetric ?Skin: Skin color, texture, turgor normal. No rashes or lesions ?Lymph nodes: Cervical, supraclavicular, and axillary nodes normal. ? ?Lab Results  ?Component Value Date  ? HGBA1C 9.0 (A) 06/07/2021  ? HGBA1C 6.0 (A) 08/11/2019  ? HGBA1C 5.9 (H) 04/15/2018  ? ? ?Lab Results  ?Component Value Date  ? CREATININE 0.81 06/05/2021  ? CREATININE 0.75 08/11/2019  ? CREATININE 0.88 04/15/2018  ? ? ?Lab Results  ?Component Value  Date  ? WBC 4.5 06/05/2021  ? HGB 10.8 (L) 06/05/2021  ? HCT 33.1 (L) 06/05/2021  ? PLT 221.0 06/05/2021  ? GLUCOSE 230 (H) 06/05/2021  ? CHOL 242 (H) 06/05/2021  ? TRIG 260.0 (H) 06/05/2021  ? HDL 52.40 06/05/2021  ? LDLDIRECT 170.0 06/05/2021  ? LDLCALC 134 (H) 08/11/2019  ? ALT 60 (H) 06/05/2021  ? AST 62 (H) 06/05/2021  ? NA 135 06/05/2021  ? K 5.1 06/05/2021  ? CL 99 06/05/2021  ? CREATININE 0.81 06/05/2021  ? BUN 16 06/05/2021  ? CO2 26 06/05/2021  ? TSH 6.33 (H) 06/05/2021  ? HGBA1C 9.0 (A) 06/07/2021  ? MICROALBUR 1.1 06/05/2021  ? ? ?MM 3D SCREEN BREAST BILATERAL ? ?Result Date: 11/22/2017 ?CLINICAL DATA:  Screening. EXAM: DIGITAL SCREENING BILATERAL MAMMOGRAM WITH  TOMO AND CAD COMPARISON:  Previous exam(s). ACR Breast Density Category c: The breast tissue is heterogeneously dense, which may obscure small masses. FINDINGS: There are no findings suspicious for malignancy. Images were processed with CAD. IMPRESSION: No mammographic evidence of malignancy. A result letter of this screening mammogram will be mailed directly to the patient. RECOMMENDATION: Screening mammogram in one year. (Code:SM-B-01Y) BI-RADS CATEGORY  1: Negative. Electronically Signed   By: Franki Cabot M.D.   On: 11/22/2017 15:19  ? ? ?Assessment & Plan:  ? ?Problem List Items Addressed This Visit   ? ? Morbid obesity ([place])  ?  Recommending use of Ozempic to manage obesity and diabetes.  She has no contraindications to use of GLP 1 agonists.  ? ?  ?  ? Relevant Medications  ? Semaglutide,0.25 or 0.'5MG'$ /DOS, (OZEMPIC, 0.25 OR 0.5 MG/DOSE,) 2 MG/1.5ML SOPN  ? Hypothyroidism due to acquired atrophy of thyroid  ?  Patient's thyroid function is underactive on current levothyroxine dose of 75  mcg daily. Will increase dose to 88 mcg daily  ? ?Lab Results  ?Component Value Date  ? TSH 6.33 (H) 06/05/2021  ? ? ?  ?  ? Relevant Medications  ? levothyroxine (SYNTHROID) 88 MCG tablet  ? Other Relevant Orders  ? TSH (Completed)  ? Hyperlipidemia with  target LDL less than 70  ? Relevant Orders  ? Lipid Profile (Completed)  ? Direct LDL (Completed)  ? Essential hypertension - Primary  ?  Well controlled on telmisartan . Renal function stable, no changes tod

## 2021-06-07 LAB — POCT GLYCOSYLATED HEMOGLOBIN (HGB A1C): Hemoglobin A1C: 9 % — AB (ref 4.0–5.6)

## 2021-06-07 MED ORDER — LEVOTHYROXINE SODIUM 88 MCG PO TABS: ORAL_TABLET | ORAL | 0 refills | Status: DC

## 2021-06-07 MED ORDER — LEVOTHYROXINE SODIUM 88 MCG PO TABS: ORAL_TABLET | ORAL | 1 refills | Status: DC

## 2021-06-07 NOTE — Assessment & Plan Note (Signed)
Recommending use of Ozempic to manage obesity and diabetes.  She has no contraindications to use of GLP 1 agonists.  ?

## 2021-06-07 NOTE — Assessment & Plan Note (Signed)
She was previously well-controlled on 12 units of Lantus and metformin but has stopped both during her absence of 1.5 years and is now uncontrolled.  She declines use of Lantus in favor of dietary restraint .  Patient has not been checking BS.  She is not motivated to exercise due to multiple joint issues,   she is up-to-date on eye exams and foot exam is normal today except for pes planus. . Patient has no evidence of  Microalbuminuria.  Patient is tolerating aspirin and red yeast rice  for CAD risk reduction and is already on on ACE/ARB . ? ? ?Lab Results  ?Component Value Date  ? MICROALBUR 1.1 06/05/2021  ? ? ?

## 2021-06-07 NOTE — Assessment & Plan Note (Signed)
Patient's thyroid function is underactive on current levothyroxine dose of 75  mcg daily. Will increase dose to 88 mcg daily  ? ?Lab Results  ?Component Value Date  ? TSH 6.33 (H) 06/05/2021  ? ? ?

## 2021-06-07 NOTE — Assessment & Plan Note (Signed)
Well controlled on telmisartan . Renal function stable, no changes today. ? ?Lab Results  ?Component Value Date  ? CREATININE 0.81 06/05/2021  ? ?Lab Results  ?Component Value Date  ? NA 135 06/05/2021  ? K 5.1 06/05/2021  ? CL 99 06/05/2021  ? CO2 26 06/05/2021  ? ? ?

## 2021-06-11 ENCOUNTER — Encounter: Payer: Self-pay | Admitting: Internal Medicine

## 2021-06-11 DIAGNOSIS — Z20822 Contact with and (suspected) exposure to covid-19: Secondary | ICD-10-CM | POA: Diagnosis not present

## 2021-06-12 ENCOUNTER — Encounter: Payer: Self-pay | Admitting: Internal Medicine

## 2021-06-14 DIAGNOSIS — Z20822 Contact with and (suspected) exposure to covid-19: Secondary | ICD-10-CM | POA: Diagnosis not present

## 2021-06-14 DIAGNOSIS — Z23 Encounter for immunization: Secondary | ICD-10-CM | POA: Diagnosis not present

## 2021-06-16 ENCOUNTER — Encounter: Payer: Self-pay | Admitting: Internal Medicine

## 2021-06-17 ENCOUNTER — Other Ambulatory Visit: Payer: Self-pay

## 2021-06-17 MED ORDER — DEXCOM G7 SENSOR MISC
0 refills | Status: DC
Start: 2021-06-17 — End: 2021-07-01

## 2021-06-18 DIAGNOSIS — Z20822 Contact with and (suspected) exposure to covid-19: Secondary | ICD-10-CM | POA: Diagnosis not present

## 2021-06-18 MED ORDER — THYROID 30 MG PO TABS: 30.0000 mg | ORAL_TABLET | Freq: Every day | ORAL | 0 refills | Status: DC

## 2021-07-01 ENCOUNTER — Other Ambulatory Visit: Payer: Self-pay

## 2021-07-01 MED ORDER — DEXCOM G7 SENSOR MISC: 3 refills | Status: DC

## 2021-07-15 LAB — FECAL OCCULT BLOOD, GUAIAC: Fecal Occult Blood: NEGATIVE

## 2021-08-05 ENCOUNTER — Encounter: Payer: Self-pay | Admitting: Internal Medicine

## 2021-08-05 ENCOUNTER — Ambulatory Visit (INDEPENDENT_AMBULATORY_CARE_PROVIDER_SITE_OTHER): Payer: Medicare Other | Admitting: Internal Medicine

## 2021-08-05 VITALS — BP 122/76 | HR 96 | Temp 98.0°F | Ht 63.0 in | Wt 283.6 lb

## 2021-08-05 DIAGNOSIS — E669 Obesity, unspecified: Secondary | ICD-10-CM

## 2021-08-05 DIAGNOSIS — D649 Anemia, unspecified: Secondary | ICD-10-CM | POA: Diagnosis not present

## 2021-08-05 DIAGNOSIS — E1169 Type 2 diabetes mellitus with other specified complication: Secondary | ICD-10-CM | POA: Diagnosis not present

## 2021-08-05 DIAGNOSIS — E785 Hyperlipidemia, unspecified: Secondary | ICD-10-CM | POA: Diagnosis not present

## 2021-08-05 DIAGNOSIS — D509 Iron deficiency anemia, unspecified: Secondary | ICD-10-CM | POA: Diagnosis not present

## 2021-08-05 DIAGNOSIS — E559 Vitamin D deficiency, unspecified: Secondary | ICD-10-CM

## 2021-08-05 DIAGNOSIS — E034 Atrophy of thyroid (acquired): Secondary | ICD-10-CM

## 2021-08-05 MED ORDER — LANTUS SOLOSTAR 100 UNIT/ML ~~LOC~~ SOPN: 25.0000 [IU] | PEN_INJECTOR | Freq: Every day | SUBCUTANEOUS | 99 refills | Status: DC

## 2021-08-05 MED ORDER — TETANUS-DIPHTH-ACELL PERTUSSIS 5-2.5-18.5 LF-MCG/0.5 IM SUSY: 0.5000 mL | PREFILLED_SYRINGE | Freq: Once | INTRAMUSCULAR | 0 refills | Status: AC

## 2021-08-05 MED ORDER — DEXCOM G7 SENSOR MISC: 3 refills | Status: DC

## 2021-08-05 MED ORDER — PAROXETINE HCL 20 MG PO TABS
20.0000 mg | ORAL_TABLET | Freq: Every day | ORAL | 1 refills | Status: DC
Start: 2021-08-05 — End: 2021-12-22

## 2021-08-05 MED ORDER — ZOSTER VAC RECOMB ADJUVANTED 50 MCG/0.5ML IM SUSR: 0.5000 mL | Freq: Once | INTRAMUSCULAR | 1 refills | Status: AC

## 2021-08-05 NOTE — Assessment & Plan Note (Addendum)
.    She had stopped taking B12 injections.  Home FOBT was negative , but iron stores are low and B12/Folate are normal.  GI REFERRAL advised

## 2021-08-05 NOTE — Progress Notes (Addendum)
Subjective:  Patient ID: Brenda Floyd, female    DOB: [birth year]  Age: 81 y.o. MRN: [id number]  CC: The primary encounter diagnosis was Anemia, unspecified type. Diagnoses of Hyperlipidemia with target LDL less than 70, Diabetes mellitus type 2 in obese [facility]), Hypothyroidism due to acquired atrophy of thyroid, and Vitamin D deficiency were also pertinent to this visit.   HPI Brenda Floyd presents for follow up on uncontrolled diabetes , new onset anemia Chief Complaint  Patient presents with   Follow-up    Follow up on diabetes, hypertension, hypothyroidism    1) new onset anemia :  had stopped b12 supplements, has restarted a sublingual supplement.  Home FOBT test was negative (ordered from [employer])  and stools have not been dark  2) DM:  home CBGS both fasitng and plst prandial have ben > 180. Ready to resume Lantus.  DexCom 7 . Needs more sensors.     3)    Outpatient Medications Prior to Visit  Medication Sig Dispense Refill   amoxicillin (AMOXIL) 500 MG capsule TAKE 4 CAPSULES BY MOUTH ONE HOUR BEFORE APPOINTMENT 4 capsule 0   aspirin 81 MG tablet Take 1 tablet (81 mg total) by mouth daily. 30 tablet 11   Barberry-Oreg Grape-Goldenseal (BERBERINE COMPLEX PO) Take 1 capsule by mouth 3 (three) times daily before meals.     celecoxib (CELEBREX) 100 MG capsule Take 1 capsule (100 mg total) by mouth 2 (two) times daily. 180 capsule 1   Cholecalciferol 50000 units capsule Take 50,000 Units by mouth once a week.     Continuous Blood Gluc Receiver (DEXCOM G7 RECEIVER) DEVI Use to check blood sugars. 1 each 0   fexofenadine (ALLEGRA) 180 MG tablet Take 180 mg by mouth daily.     glucose blood (FREESTYLE LITE) test strip Use to check blood sugars once daily,  Twice if needed   DM 250.00 102 each 3   levothyroxine (SYNTHROID) 88 MCG tablet TAKE 1 TABLET DAILY FOR HYPOTHYROIDISM 30 tablet 0   magnesium gluconate (MAGONATE) 500 MG tablet Take 500 mg by mouth 2 (two) times daily.      omeprazole (PRILOSEC) 40 MG capsule Take 1 capsule (40 mg total) by mouth daily. 30 capsule 3   telmisartan (MICARDIS) 40 MG tablet TAKE 1 TABLET AT BEDTIME 90 tablet 3   thyroid (ARMOUR THYROID) 30 MG tablet Take 1 tablet (30 mg total) by mouth daily before breakfast. 90 tablet 0   TURMERIC PO Take by mouth daily.     Continuous Blood Gluc Sensor (DEXCOM G7 SENSOR) MISC Use with Dexcom Receiver. Change sensor every 14 days. 3 each 3   MAGNESIUM CITRATE PO Take 1 tablet by mouth daily. (Patient not taking: Reported on 08/05/2021)     magnesium gluconate (MAGONATE) 500 MG tablet Take 500 mg by mouth daily. (Patient not taking: Reported on 08/05/2021)     PARoxetine (PAXIL) 20 MG tablet TAKE 1 TABLET DAILY (Patient not taking: Reported on 08/05/2021) 90 tablet 3   Semaglutide,0.25 or 0.'5MG'$ /DOS, (OZEMPIC, 0.25 OR 0.5 MG/DOSE,) 2 MG/1.5ML SOPN Inject 0.5 mg into the skin once a week. 4.5 mL 1   No facility-administered medications prior to visit.    Review of Systems;  Patient denies headache, fevers, malaise, unintentional weight loss, skin rash, eye pain, sinus congestion and sinus pain, sore throat, dysphagia,  hemoptysis , cough, dyspnea, wheezing, chest pain, palpitations, orthopnea, edema, abdominal pain, nausea, melena, diarrhea, constipation, flank pain, dysuria, hematuria, urinary  Frequency, nocturia,  numbness, tingling, seizures,  Focal weakness, Loss of consciousness,  Tremor, insomnia, depression, anxiety, and suicidal ideation.      Objective:  BP 122/76 (BP Location: Left Arm, Patient Position: Sitting, Cuff Size: Small)   Pulse 96   Temp 98 F (36.7 C) (Oral)   Ht '5\' 3"'$  (1.6 m)   Wt 283 lb 9.6 oz (128.6 kg)   SpO2 94%   BMI 50.24 kg/m   BP Readings from Last 3 Encounters:  08/05/21 122/76  06/05/21 118/78  08/11/19 138/64    Wt Readings from Last 3 Encounters:  08/05/21 283 lb 9.6 oz (128.6 kg)  06/05/21 284 lb 9.6 oz (129.1 kg)  11/13/20 298 lb (135.2 kg)     General appearance: alert, cooperative and appears stated age Ears: normal TM's and external ear canals both ears Throat: lips, mucosa, and tongue normal; teeth and gums normal Neck: no adenopathy, no carotid bruit, supple, symmetrical, trachea midline and thyroid not enlarged, symmetric, no tenderness/mass/nodules Back: symmetric, no curvature. ROM normal. No CVA tenderness. Lungs: clear to auscultation bilaterally Heart: regular rate and rhythm, S1, S2 normal, no murmur, click, rub or gallop Abdomen: soft, non-tender; bowel sounds normal; no masses,  no organomegaly Pulses: 2+ and symmetric Skin: Skin color, texture, turgor normal. No rashes or lesions Lymph nodes: Cervical, supraclavicular, and axillary nodes normal.  Lab Results  Component Value Date   HGBA1C 9.0 (A) 06/07/2021   HGBA1C 6.0 (A) 08/11/2019   HGBA1C 5.9 (H) 04/15/2018    Lab Results  Component Value Date   CREATININE 0.81 06/05/2021   CREATININE 0.75 08/11/2019   CREATININE 0.88 04/15/2018    Lab Results  Component Value Date   WBC 4.5 08/05/2021   HGB 9.3 (L) 08/05/2021   HCT 29.9 (L) 08/05/2021   PLT 244.0 08/05/2021   GLUCOSE 230 (H) 06/05/2021   CHOL 242 (H) 06/05/2021   TRIG 260.0 (H) 06/05/2021   HDL 52.40 06/05/2021   LDLDIRECT 170.0 06/05/2021   LDLCALC 134 (H) 08/11/2019   ALT 60 (H) 06/05/2021   AST 62 (H) 06/05/2021   NA 135 06/05/2021   K 5.1 06/05/2021   CL 99 06/05/2021   CREATININE 0.81 06/05/2021   BUN 16 06/05/2021   CO2 26 06/05/2021   TSH 0.78 08/05/2021   HGBA1C 9.0 (A) 06/07/2021   MICROALBUR 1.1 06/05/2021    MM 3D SCREEN BREAST BILATERAL  Result Date: 11/22/2017 CLINICAL DATA:  Screening. EXAM: DIGITAL SCREENING BILATERAL MAMMOGRAM WITH TOMO AND CAD COMPARISON:  Previous exam(s). ACR Breast Density Category c: The breast tissue is heterogeneously dense, which may obscure small masses. FINDINGS: There are no findings suspicious for malignancy. Images were processed  with CAD. IMPRESSION: No mammographic evidence of malignancy. A result letter of this screening mammogram will be mailed directly to the patient. RECOMMENDATION: Screening mammogram in one year. (Code:SM-B-01Y) BI-RADS CATEGORY  1: Negative. Electronically Signed   By: Franki Cabot M.D.   On: 11/22/2017 15:19    Assessment & Plan:   Problem List Items Addressed This Visit     Anemia - Primary    .  She had stopped taking B12 injections.  Home FOBT was negative , but iron stores are low and B12/Folate are normal.  GI REFERRAL advised       Relevant Orders   IBC + Ferritin (Completed)   B12 and Folate Panel (Completed)   CBC with Differential/Platelet (Completed)   Hypothyroidism due to acquired atrophy of thyroid    Thyroid is underactive  based on TSH > 6 in April,  But she is requesting s treatment based on the full thyroid panel which includes T3 and Free T4 and is supplementing levothyroxine 8 mcg with Armour thyroid 30 mg daily . I have explained to her that I will not adjust her dose based on T3 and T4 levels which are less stable than TSH.  Referral to Endocrinology has been offered ; she is contemplative       Relevant Orders   Thyroid Panel With TSH (Completed)   Diabetes mellitus type 2 in obese [facility])    Her diabetes is uncontrolled because she stopped taking lantus in preference for a natural supplement.  All sugars have been > 180 so she is now willing to resume lantus,  starting with 25 units daily       Relevant Medications   insulin glargine (LANTUS SOLOSTAR) 100 UNIT/ML Solostar Pen   Other Relevant Orders   Comprehensive metabolic panel   Hemoglobin A1c   Hyperlipidemia with target LDL less than 70   Relevant Orders   Lipid panel   LDL cholesterol, direct   Other Visit Diagnoses     Vitamin D deficiency       Relevant Orders   VITAMIN D 25 Hydroxy (Vit-D Deficiency, Fractures) (Completed)       I spent a total of 30 minutes with this patient in a face to  face visit on the date of this encounter reviewing the last office visit with me in May, her most recent labs , patient's diet and eating habits, home blood sugar readings, and post visit ordering of testing and therapeutics.    Follow-up: No follow-ups on file.   Crecencio Mc, MD

## 2021-08-06 ENCOUNTER — Other Ambulatory Visit: Payer: Self-pay

## 2021-08-06 ENCOUNTER — Encounter: Payer: Self-pay | Admitting: Internal Medicine

## 2021-08-06 LAB — IBC + FERRITIN
Ferritin: 10 ng/mL (ref 10.0–291.0)
Iron: 58 ug/dL (ref 42–145)
Saturation Ratios: 12 % — ABNORMAL LOW (ref 20.0–50.0)
TIBC: 481.6 ug/dL — ABNORMAL HIGH (ref 250.0–450.0)
Transferrin: 344 mg/dL (ref 212.0–360.0)

## 2021-08-06 LAB — CBC WITH DIFFERENTIAL/PLATELET
Basophils Absolute: 0.1 10*3/uL (ref 0.0–0.1)
Basophils Relative: 2 % (ref 0.0–3.0)
Eosinophils Absolute: 0.3 10*3/uL (ref 0.0–0.7)
Eosinophils Relative: 5.9 % — ABNORMAL HIGH (ref 0.0–5.0)
HCT: 29.9 % — ABNORMAL LOW (ref 36.0–46.0)
Hemoglobin: 9.3 g/dL — ABNORMAL LOW (ref 12.0–15.0)
Lymphocytes Relative: 26.1 % (ref 12.0–46.0)
Lymphs Abs: 1.2 10*3/uL (ref 0.7–4.0)
MCHC: 31.1 g/dL (ref 30.0–36.0)
MCV: 94.6 fl (ref 78.0–100.0)
Monocytes Absolute: 0.4 10*3/uL (ref 0.1–1.0)
Monocytes Relative: 9.5 % (ref 3.0–12.0)
Neutro Abs: 2.5 10*3/uL (ref 1.4–7.7)
Neutrophils Relative %: 56.5 % (ref 43.0–77.0)
Platelets: 244 10*3/uL (ref 150.0–400.0)
RBC: 3.17 Mil/uL — ABNORMAL LOW (ref 3.87–5.11)
RDW: 18.1 % — ABNORMAL HIGH (ref 11.5–15.5)
WBC: 4.5 10*3/uL (ref 4.0–10.5)

## 2021-08-06 LAB — THYROID PANEL WITH TSH
Free Thyroxine Index: 2.4 (ref 1.4–3.8)
T3 Uptake: 28 % (ref 22–35)
T4, Total: 8.5 ug/dL (ref 5.1–11.9)
TSH: 0.78 mIU/L (ref 0.40–4.50)

## 2021-08-06 LAB — VITAMIN D 25 HYDROXY (VIT D DEFICIENCY, FRACTURES): VITD: 54.71 ng/mL (ref 30.00–100.00)

## 2021-08-06 LAB — B12 AND FOLATE PANEL
Folate: 6 ng/mL (ref >5.9)
Vitamin B-12: 834 pg/mL (ref 211–911)

## 2021-08-08 NOTE — Addendum Note (Signed)
Addended by: Crecencio Mc on: 08/08/2021 10:29 AM   Modules accepted: Orders

## 2021-08-09 ENCOUNTER — Encounter: Payer: Self-pay | Admitting: Internal Medicine

## 2021-09-23 ENCOUNTER — Other Ambulatory Visit: Payer: Self-pay

## 2021-09-23 MED ORDER — OMEPRAZOLE 40 MG PO CPDR: 40.0000 mg | DELAYED_RELEASE_CAPSULE | Freq: Every day | ORAL | 3 refills | Status: DC

## 2021-10-21 DIAGNOSIS — D509 Iron deficiency anemia, unspecified: Secondary | ICD-10-CM | POA: Diagnosis not present

## 2021-10-21 DIAGNOSIS — Z86010 Personal history of colonic polyps: Secondary | ICD-10-CM | POA: Diagnosis not present

## 2021-10-27 DIAGNOSIS — D509 Iron deficiency anemia, unspecified: Secondary | ICD-10-CM | POA: Diagnosis not present

## 2021-11-13 ENCOUNTER — Telehealth: Payer: Self-pay | Admitting: Internal Medicine

## 2021-11-13 NOTE — Telephone Encounter (Signed)
Copied from [place] [id number]. Topic: Medicare AWV >> Nov 13, 2021 10:27 AM Devoria Glassing wrote: Reason for CRM: Called patient to schedule Annual Wellness Visit.  Please schedule with Nurse Health Advisor Denisa O'Brien-Blaney, LPN at [facility]. This appt can be telephone or office visit.  Please call [phone] ask for [facility]

## 2021-11-17 ENCOUNTER — Other Ambulatory Visit: Payer: Self-pay | Admitting: Internal Medicine

## 2021-11-19 ENCOUNTER — Telehealth: Payer: Self-pay | Admitting: Internal Medicine

## 2021-11-19 NOTE — Telephone Encounter (Signed)
Copied from [place] [id number]. Topic: Medicare AWV >> Nov 19, 2021  2:30 PM Devoria Glassing wrote: Reason for CRM: Called patient to schedule Annual Wellness Visit.  Please schedule with Nurse Health Advisor Denisa O'Brien-Blaney, LPN at [facility]. This appt can be telephone or office visit.  Please call [phone] ask for [facility]

## 2021-11-28 ENCOUNTER — Ambulatory Visit (INDEPENDENT_AMBULATORY_CARE_PROVIDER_SITE_OTHER): Payer: Medicare Other

## 2021-11-28 VITALS — Ht 63.0 in | Wt 283.0 lb

## 2021-11-28 DIAGNOSIS — Z0000 Encounter for general adult medical examination without abnormal findings: Secondary | ICD-10-CM | POA: Diagnosis not present

## 2021-11-28 NOTE — Progress Notes (Addendum)
Subjective:   Brenda Floyd is a 81 y.o. female who presents for Medicare Annual (Subsequent) preventive examination.  Review of Systems    No ROS.  Medicare Wellness Virtual Visit.  Visual/audio telehealth visit, UTA vital signs.   See social history for additional risk factors.   Cardiac Risk Factors include: advanced age (>3mn, >>26women);diabetes mellitus;hypertension     Objective:    Today's Vitals   11/28/21 1535  Weight: 283 lb (128.4 kg)  Height: '5\' 3"'$  (1.6 m)   Body mass index is 50.13 kg/m.     11/28/2021    3:38 PM 11/13/2020    1:30 PM 11/13/2019    1:39 PM 08/19/2018   11:47 AM 08/16/2017   12:09 PM 05/04/2017    7:48 AM 04/06/2017    7:51 AM  Advanced Directives  Does Patient Have a Medical Advance Directive? No Yes No Yes Yes No No  Type of [place] will [place]   Does patient want to make changes to medical advance directive?  No - Patient declined   No - Patient declined    Copy of HPen Argylin Chart?  No - copy requested  No - copy requested No - copy requested    Would patient like information on creating a medical advance directive? No - Patient declined  No - Patient declined   No - Patient declined No - Patient declined    Current Medications (verified) Outpatient Encounter Medications as of 11/28/2021  Medication Sig   amoxicillin (AMOXIL) 500 MG capsule TAKE 4 CAPSULES BY MOUTH ONE HOUR BEFORE APPOINTMENT   aspirin 81 MG tablet Take 1 tablet (81 mg total) by mouth daily.   Barberry-Oreg Grape-Goldenseal (BERBERINE COMPLEX PO) Take 1 capsule by mouth 3 (three) times daily before meals.   celecoxib (CELEBREX) 100 MG capsule Take 1 capsule (100 mg total) by mouth 2 (two) times daily.   Cholecalciferol 125 MCG (5000 UT) capsule Take 5,000 Units by mouth daily.   Cholecalciferol 50000 units capsule Take 50,000 Units by mouth once a  week.   Continuous Blood Gluc Receiver (DEXCOM G7 RECEIVER) DEVI Use to check blood sugars.   Continuous Blood Gluc Sensor (DEXCOM G7 SENSOR) MISC Use with Dexcom Receiver. Change sensor every 10 days.   Cyanocobalamin (B-12) 5000 MCG CAPS Take 1 capsule by mouth daily.   fexofenadine (ALLEGRA) 180 MG tablet Take 180 mg by mouth daily.   glucose blood (FREESTYLE LITE) test strip Use to check blood sugars once daily,  Twice if needed   DM 250.00   insulin glargine (LANTUS SOLOSTAR) 100 UNIT/ML Solostar Pen Inject 25 Units into the skin daily.   levothyroxine (SYNTHROID) 88 MCG tablet TAKE 1 TABLET DAILY FOR HYPOTHYROIDISM   magnesium gluconate (MAGONATE) 500 MG tablet Take 500 mg by mouth 2 (two) times daily.   omeprazole (PRILOSEC) 40 MG capsule Take 1 capsule (40 mg total) by mouth daily.   PARoxetine (PAXIL) 20 MG tablet Take 1 tablet (20 mg total) by mouth daily.   telmisartan (MICARDIS) 40 MG tablet TAKE 1 TABLET AT BEDTIME   thyroid (ARMOUR THYROID) 30 MG tablet Take 1 tablet (30 mg total) by mouth daily before breakfast.   TURMERIC PO Take by mouth daily.   No facility-administered encounter medications on file as of 11/28/2021.    Allergies (verified) Crestor [rosuvastatin]   History: Past Medical History:  Diagnosis Date  Arthritis    neck, knees, left shoulder   Cancer ([place])    skin ca   Colon polyps 2012   [place], followup due 2015   Diabetes mellitus    Diverticulosis of sigmoid colon July 2012   by colonoscopy   Dyspnea    Fracture closed, femur, shaft ([place]) remote   s/p screws and rod repair   Hypertension    Thyroid disease    Past Surgical History:  Procedure Laterality Date   ABDOMINAL HYSTERECTOMY     BREAST CYST ASPIRATION Left    neg   CATARACT EXTRACTION W/PHACO Right 04/06/2017   Procedure: CATARACT EXTRACTION PHACO AND INTRAOCULAR LENS PLACEMENT ([place]) RIGHT DIABETIC;  Surgeon: Eulogio Bear, MD;  Location: [facility];  Service:  Ophthalmology;  Laterality: Right;   CATARACT EXTRACTION W/PHACO Left 05/04/2017   Procedure: CATARACT EXTRACTION PHACO AND INTRAOCULAR LENS PLACEMENT ([place]) LEFT DIABETIC;  Surgeon: Eulogio Bear, MD;  Location: [facility];  Service: Ophthalmology;  Laterality: Left;  DIABETIC   JOINT REPLACEMENT     bilateral knee, 2011   Family History  Problem Relation Age of Onset   Cancer Mother        breast cancer and colon ca   Breast cancer Mother 4   Cancer Father        leukemia   Diabetes Sister    Hypertension Sister    Breast cancer Sister 25   Breast cancer Maternal Aunt    Breast cancer Cousin        maternal 1st cousin   Social History   Socioeconomic History   Marital status: Single    Spouse name: Not on file   Number of children: Not on file   Years of education: Not on file   Highest education level: Not on file  Occupational History   Not on file  Tobacco Use   Smoking status: Former    Types: Cigarettes    Quit date: 10/30/1990    Years since quitting: 31.1   Smokeless tobacco: Never  Substance and Sexual Activity   Alcohol use: No   Drug use: No   Sexual activity: Never  Other Topics Concern   Not on file  Social History Narrative   Not on file   Social Determinants of Health   Financial Resource Strain: Low Risk  (11/28/2021)   Overall Financial Resource Strain (CARDIA)    Difficulty of Paying Living Expenses: Not hard at all  Food Insecurity: No Food Insecurity (11/28/2021)   Hunger Vital Sign    Worried About Running Out of Food in the Last Year: Never true    [place] in the Last Year: Never true  Transportation Needs: No Transportation Needs (11/28/2021)   PRAPARE - Hydrologist (Medical): No    Lack of Transportation (Non-Medical): No  Physical Activity: Inactive (11/28/2021)   Exercise Vital Sign    Days of Exercise per Week: 0 days    Minutes of Exercise per Session: 0 min  Stress: No Stress  Concern Present (11/28/2021)   [place]    Feeling of Stress : Not at all  Social Connections: Unknown (11/28/2021)   Social Connection and Isolation Panel [NHANES]    Frequency of Communication with Friends and Family: Not on file    Frequency of Social Gatherings with Friends and Family: More than three times a week    Attends Religious Services:  Not on file    Active Member of Clubs or Organizations: Not on file    Attends Club or Organization Meetings: Not on file    Marital Status: Not on file    Tobacco Counseling Counseling given: Not Answered   Clinical Intake:  Pre-visit preparation completed: Yes        Diabetes: Yes Nutrition Risk Assessment: Has the patient had any N/V/D within the last 2 months?  No  Does the patient have any non-healing wounds?  No  Has the patient had any unintentional weight loss or weight gain?  No   Financial Strains and Diabetes Management: Are you having any financial strains with the device, your supplies or your medication? No .  Does the patient want to be seen by Chronic Care Management for management of their diabetes?  No  Would the patient like to be referred to a Nutritionist or for Diabetic Management?  No   Diabetic Exams: Diabetic Eye Exam: Overdue for diabetic eye exam. Pt has been advised about the importance in completing this exam. Patient advised to call and schedule an eye exam.    How often do you need to have someone help you when you read instructions, pamphlets, or other written materials from your doctor or pharmacy?: 1 - Never   Interpreter Needed?: No      Activities of Daily Living    11/28/2021    3:38 PM  In your present state of health, do you have any difficulty performing the following activities:  Hearing? 1  Vision? 0  Difficulty concentrating or making decisions? 0  Walking or climbing stairs? 0  Dressing or bathing? 0   Doing errands, shopping? 0  Preparing Food and eating ? N  Using the Toilet? N  In the past six months, have you accidently leaked urine? Y  Comment Managed with daily brief/pad  Do you have problems with loss of bowel control? N  Managing your Medications? N  Managing your Finances? N  Housekeeping or managing your Housekeeping? N    Patient Care Team: Crecencio Mc, MD as PCP - General (Internal Medicine)  Indicate any recent Medical Services you may have received from other than [facility] providers in the past year (date may be approximate).     Assessment:   This is a routine wellness examination for Brenda Floyd.  I connected with  [name] on 11/28/21 by a audio enabled telemedicine application and verified that I am speaking with the correct person using two identifiers.  Patient Location: Home  Provider Location: Office/Clinic  I discussed the limitations of evaluation and management by telemedicine. The patient expressed understanding and agreed to proceed.   These are the goals we discussed:  Goals       Patient Stated     DIET - EAT MORE FRUITS AND VEGETABLES (pt-stated)      I would like to lose a little more weight      Other     Increase physical activity      As tolerated        This is a list of the screening recommended for you and due dates:  Health Maintenance  Topic Date Due   Mammogram  12/05/2021*   Eye exam for diabetics  12/05/2021*   Flu Shot  05/10/2022*   Tetanus Vaccine  11/29/2022*   Hemoglobin A1C  12/07/2021   Yearly kidney function blood test for diabetes  06/06/2022   Yearly kidney health urinalysis for diabetes  06/06/2022   Complete foot exam   06/06/2022   Pneumonia Vaccine  Completed   DEXA scan (bone density measurement)  Completed   HPV Vaccine  Aged Out   COVID-19 Vaccine  Discontinued   Zoster (Shingles) Vaccine  Discontinued  *Topic was postponed. The date shown is not the original due date.      Hearing/Vision screen Hearing Screening - Comments:: Patient has difficulty hearing conversational tones in a crowd. Wears hearing aids sometimes. Vision Screening - Comments:: Followed by [facility]  Diabetic eye exam; no retinopathy Cataract extraction, bilateral They have regular follow up with the ophthalmologist  Dietary issues and exercise activities discussed: Current Exercise Habits: Home exercise routine, Intensity: Mild   Goals Addressed               This Visit's Progress     Patient Stated     DIET - EAT MORE FRUITS AND VEGETABLES (pt-stated)   On track     I would like to lose a little more weight      Other     Increase physical activity        As tolerated       Depression Screen    11/28/2021    3:37 PM 06/05/2021   11:11 AM 11/13/2020    1:20 PM 11/13/2019    1:40 PM 08/19/2018   11:43 AM 08/16/2017   12:09 PM 01/14/2016    4:17 PM  PHQ 2/9 Scores  PHQ - 2 Score 0 0 0 0 0 0 0    Fall Risk    11/28/2021    3:38 PM 08/05/2021    1:30 PM 06/05/2021   11:11 AM 11/13/2020    1:34 PM 11/13/2019    1:39 PM  Fall Risk   Falls in the past year? 0 0 0 0 0  Number falls in past yr: 0 0  0   Injury with Fall? 0 0     Risk for fall due to :  No Fall Risks No Fall Risks    Follow up Falls evaluation completed;Falls prevention discussed Falls evaluation completed Falls evaluation completed Falls evaluation completed Falls evaluation completed    FALL RISK PREVENTION PERTAINING TO THE HOME: Home free of loose throw rugs in walkways, pet beds, electrical cords, etc? Yes  Adequate lighting in your home to reduce risk of falls? Yes   ASSISTIVE DEVICES UTILIZED TO PREVENT FALLS: Life alert? No  Use of a cane, walker or w/c? No   TIMED UP AND GO: Was the test performed? No .    Cognitive Function:    08/16/2017   12:13 PM  MMSE - Mini Mental State Exam  Orientation to time 5  Orientation to Place 5  Registration 3  Attention/ Calculation 5   Recall 1  Language- name 2 objects 2  Language- repeat 1  Language- follow 3 step command 3  Language- read & follow direction 1  Write a sentence 1  Copy design 1  Total score 28        11/28/2021    3:39 PM 08/19/2018   11:44 AM 01/14/2016    4:35 PM  6CIT Screen  What Year? 0 points 0 points 0 points  What month? 0 points 0 points 0 points  What time? 0 points 0 points 0 points  Count back from 20 0 points 0 points 0 points  Months in reverse 0 points 0 points 0 points  Repeat phrase 0 points  0 points 0 points  Total Score 0 points 0 points 0 points    Immunizations Immunization History  Administered Date(s) Administered   Influenza Split 09/29/2010, 11/09/2011   Influenza,inj,Quad PF,6+ Mos 11/08/2012   Pneumococcal Conjugate-13 02/23/2013   Pneumococcal Polysaccharide-23 11/09/2003, 12/21/2016   Tdap 04/15/2011   Tetanus 11/09/2011    TDAP status: Due, Education has been provided regarding the importance of this vaccine. Advised may receive this vaccine at local pharmacy or Health Dept. Aware to provide a copy of the vaccination record if obtained from local pharmacy or Health Dept. Verbalized acceptance and understanding.  Flu Vaccine status: Due, Education has been provided regarding the importance of this vaccine. Advised may receive this vaccine at local pharmacy or Health Dept. Aware to provide a copy of the vaccination record if obtained from local pharmacy or Health Dept. Verbalized acceptance and understanding.  Screening Tests Health Maintenance  Topic Date Due   MAMMOGRAM  12/05/2021 (Originally 11/23/2018)   OPHTHALMOLOGY EXAM  12/05/2021 (Originally 01/15/2018)   INFLUENZA VACCINE  05/10/2022 (Originally 09/09/2021)   TETANUS/TDAP  11/29/2022 (Originally 11/08/2021)   HEMOGLOBIN A1C  12/07/2021   Diabetic kidney evaluation - GFR measurement  06/06/2022   Diabetic kidney evaluation - Urine ACR  06/06/2022   FOOT EXAM  06/06/2022   Pneumonia Vaccine 98+  Years old  Completed   DEXA SCAN  Completed   HPV VACCINES  Aged Out   COVID-19 Vaccine  Discontinued   Zoster Vaccines- Shingrix  Discontinued    Health Maintenance There are no preventive care reminders to display for this patient.  Mammogram- deferred for further follow up with PCP per patient.   Lung Cancer Screening: (Low Dose CT Chest recommended if Age 5-80 years, 30 pack-year currently smoking OR have quit w/in 15years.) does not qualify.   Hepatitis C Screening: does not qualify  Vision Screening: Recommended annual ophthalmology exams for early detection of glaucoma and other disorders of the eye.  Dental Screening: Recommended annual dental exams for proper oral hygiene  Community Resource Referral / Chronic Care Management: CRR required this visit?  No   CCM required this visit?  No      Plan:     I have personally reviewed and noted the following in the patient's chart:   Medical and social history Use of alcohol, tobacco or illicit drugs  Current medications and supplements including opioid prescriptions. Patient is not currently taking opioid prescriptions. Functional ability and status Nutritional status Physical activity Advanced directives List of other physicians Hospitalizations, surgeries, and ER visits in previous 12 months Vitals Screenings to include cognitive, depression, and falls Referrals and appointments  In addition, I have reviewed and discussed with patient certain preventive protocols, quality metrics, and best practice recommendations. A written personalized care plan for preventive services as well as general preventive health recommendations were provided to patient.     [place], LPN   [id number]     I have reviewed the above information and agree with above.   Deborra Medina, MD

## 2021-11-28 NOTE — Patient Instructions (Addendum)
Brenda Floyd , Thank you for taking time to come for your Medicare Wellness Visit. I appreciate your ongoing commitment to your health goals. Please review the following plan we discussed and let me know if I can assist you in the future.   These are the goals we discussed:  Goals       Patient Stated     DIET - EAT MORE FRUITS AND VEGETABLES (pt-stated)      I would like to lose a little more weight      Other     Increase physical activity      As tolerated        This is a list of the screening recommended for you and due dates:  Health Maintenance  Topic Date Due   Mammogram  12/05/2021*   Eye exam for diabetics  12/05/2021*   Flu Shot  05/10/2022*   Tetanus Vaccine  11/29/2022*   Hemoglobin A1C  12/07/2021   Yearly kidney function blood test for diabetes  06/06/2022   Yearly kidney health urinalysis for diabetes  06/06/2022   Complete foot exam   06/06/2022   Pneumonia Vaccine  Completed   DEXA scan (bone density measurement)  Completed   HPV Vaccine  Aged Out   COVID-19 Vaccine  Discontinued   Zoster (Shingles) Vaccine  Discontinued  *Topic was postponed. The date shown is not the original due date.    Advanced directives: End of life planning; Advanced aging; Advanced directives discussed.  No HCPOA/Living Will.  Additional information declined at this time.  Conditions/risks identified: none new  Next appointment: Follow up in one year for your annual wellness visit    Preventive Care 65 Years and Older, Female Preventive care refers to lifestyle choices and visits with your health care provider that can promote health and wellness. What does preventive care include? A yearly physical exam. This is also called an annual well check. Dental exams once or twice a year. Routine eye exams. Ask your health care provider how often you should have your eyes checked. Personal lifestyle choices, including: Daily care of your teeth and gums. Regular physical  activity. Eating a healthy diet. Avoiding tobacco and drug use. Limiting alcohol use. Practicing safe sex. Taking low-dose aspirin every day. Taking vitamin and mineral supplements as recommended by your health care provider. What happens during an annual well check? The services and screenings done by your health care provider during your annual well check will depend on your age, overall health, lifestyle risk factors, and family history of disease. Counseling  Your health care provider may ask you questions about your: Alcohol use. Tobacco use. Drug use. Emotional well-being. Home and relationship well-being. Sexual activity. Eating habits. History of falls. Memory and ability to understand (cognition). Work and work Statistician. Reproductive health. Screening  You may have the following tests or measurements: Height, weight, and BMI. Blood pressure. Lipid and cholesterol levels. These may be checked every 5 years, or more frequently if you are over 79 years old. Skin check. Lung cancer screening. You may have this screening every year starting at age 7 if you have a 30-pack-year history of smoking and currently smoke or have quit within the past 15 years. Fecal occult blood test (FOBT) of the stool. You may have this test every year starting at age 25. Flexible sigmoidoscopy or colonoscopy. You may have a sigmoidoscopy every 5 years or a colonoscopy every 10 years starting at age 25. Hepatitis C blood test. Hepatitis B  blood test. Sexually transmitted disease (STD) testing. Diabetes screening. This is done by checking your blood sugar (glucose) after you have not eaten for a while (fasting). You may have this done every 1-3 years. Bone density scan. This is done to screen for osteoporosis. You may have this done starting at age 43. Mammogram. This may be done every 1-2 years. Talk to your health care provider about how often you should have regular mammograms. Talk with your  health care provider about your test results, treatment options, and if necessary, the need for more tests. Vaccines  Your health care provider may recommend certain vaccines, such as: Influenza vaccine. This is recommended every year. Tetanus, diphtheria, and acellular pertussis (Tdap, Td) vaccine. You may need a Td booster every 10 years. Zoster vaccine. You may need this after age 36. Pneumococcal 13-valent conjugate (PCV13) vaccine. One dose is recommended after age 74. Pneumococcal polysaccharide (PPSV23) vaccine. One dose is recommended after age 54. Talk to your health care provider about which screenings and vaccines you need and how often you need them. This information is not intended to replace advice given to you by your health care provider. Make sure you discuss any questions you have with your health care provider. Document Released: 02/22/2015 Document Revised: 10/16/2015 Document Reviewed: 11/27/2014 Elsevier Interactive Patient Education  2017 [place] Prevention in the Home Falls can cause injuries. They can happen to people of all ages. There are many things you can do to make your home safe and to help prevent falls. What can I do on the outside of my home? Regularly fix the edges of walkways and driveways and fix any cracks. Remove anything that might make you trip as you walk through a door, such as a raised step or threshold. Trim any bushes or trees on the path to your home. Use bright outdoor lighting. Clear any walking paths of anything that might make someone trip, such as rocks or tools. Regularly check to see if handrails are loose or broken. Make sure that both sides of any steps have handrails. Any raised decks and porches should have guardrails on the edges. Have any leaves, snow, or ice cleared regularly. Use sand or salt on walking paths during winter. Clean up any spills in your garage right away. This includes oil or grease spills. What can I  do in the bathroom? Use night lights. Install grab bars by the toilet and in the tub and shower. Do not use towel bars as grab bars. Use non-skid mats or decals in the tub or shower. If you need to sit down in the shower, use a plastic, non-slip stool. Keep the floor dry. Clean up any water that spills on the floor as soon as it happens. Remove soap buildup in the tub or shower regularly. Attach bath mats securely with double-sided non-slip rug tape. Do not have throw rugs and other things on the floor that can make you trip. What can I do in the bedroom? Use night lights. Make sure that you have a light by your bed that is easy to reach. Do not use any sheets or blankets that are too big for your bed. They should not hang down onto the floor. Have a firm chair that has side arms. You can use this for support while you get dressed. Do not have throw rugs and other things on the floor that can make you trip. What can I do in the kitchen? Clean up any spills  right away. Avoid walking on wet floors. Keep items that you use a lot in easy-to-reach places. If you need to reach something above you, use a strong step stool that has a grab bar. Keep electrical cords out of the way. Do not use floor polish or wax that makes floors slippery. If you must use wax, use non-skid floor wax. Do not have throw rugs and other things on the floor that can make you trip. What can I do with my stairs? Do not leave any items on the stairs. Make sure that there are handrails on both sides of the stairs and use them. Fix handrails that are broken or loose. Make sure that handrails are as long as the stairways. Check any carpeting to make sure that it is firmly attached to the stairs. Fix any carpet that is loose or worn. Avoid having throw rugs at the top or bottom of the stairs. If you do have throw rugs, attach them to the floor with carpet tape. Make sure that you have a light switch at the top of the stairs  and the bottom of the stairs. If you do not have them, ask someone to add them for you. What else can I do to help prevent falls? Wear shoes that: Do not have high heels. Have rubber bottoms. Are comfortable and fit you well. Are closed at the toe. Do not wear sandals. If you use a stepladder: Make sure that it is fully opened. Do not climb a closed stepladder. Make sure that both sides of the stepladder are locked into place. Ask someone to hold it for you, if possible. Clearly mark and make sure that you can see: Any grab bars or handrails. First and last steps. Where the edge of each step is. Use tools that help you move around (mobility aids) if they are needed. These include: Canes. Walkers. Scooters. Crutches. Turn on the lights when you go into a dark area. Replace any light bulbs as soon as they burn out. Set up your furniture so you have a clear path. Avoid moving your furniture around. If any of your floors are uneven, fix them. If there are any pets around you, be aware of where they are. Review your medicines with your doctor. Some medicines can make you feel dizzy. This can increase your chance of falling. Ask your doctor what other things that you can do to help prevent falls. This information is not intended to replace advice given to you by your health care provider. Make sure you discuss any questions you have with your health care provider. Document Released: 11/22/2008 Document Revised: 07/04/2015 Document Reviewed: 03/02/2014 Elsevier Interactive Patient Education  2017 [employer].

## 2021-12-05 ENCOUNTER — Encounter: Payer: Self-pay | Admitting: Internal Medicine

## 2021-12-05 ENCOUNTER — Ambulatory Visit (INDEPENDENT_AMBULATORY_CARE_PROVIDER_SITE_OTHER): Payer: Medicare Other | Admitting: Internal Medicine

## 2021-12-05 VITALS — BP 122/76 | HR 76 | Temp 97.5°F | Ht 63.5 in | Wt 269.4 lb

## 2021-12-05 DIAGNOSIS — E538 Deficiency of other specified B group vitamins: Secondary | ICD-10-CM

## 2021-12-05 DIAGNOSIS — E669 Obesity, unspecified: Secondary | ICD-10-CM | POA: Diagnosis not present

## 2021-12-05 DIAGNOSIS — I10 Essential (primary) hypertension: Secondary | ICD-10-CM

## 2021-12-05 DIAGNOSIS — E034 Atrophy of thyroid (acquired): Secondary | ICD-10-CM | POA: Diagnosis not present

## 2021-12-05 DIAGNOSIS — D509 Iron deficiency anemia, unspecified: Secondary | ICD-10-CM

## 2021-12-05 DIAGNOSIS — E1169 Type 2 diabetes mellitus with other specified complication: Secondary | ICD-10-CM

## 2021-12-05 MED ORDER — TETANUS-DIPHTHERIA TOXOIDS TD 5-2 LFU IM INJ: 0.5000 mL | INJECTION | Freq: Once | INTRAMUSCULAR | 0 refills | Status: AC

## 2021-12-05 NOTE — Progress Notes (Unsigned)
Subjective:  Patient ID: Brenda Floyd, female    DOB: 06/18/1940  Age: 81 y.o. MRN: [id number]  CC: There were no encounter diagnoses.   HPI Brenda Floyd presents for  Chief Complaint  Patient presents with   Follow-up    6 month follow up- Evaluate Hematocrit & Hemoglobin   1) type 2 DM: has resumed Lantus at 25 units daily.  BS have been between 100 and 140).  Diet reviewed,    she has lost 15 lbs eating more balanced diet with portion control   2) OSA:  wearing CPAP .  Reports notes 100% usage averaging 7 hr 40 mintues for the period from Jun 15 to Sept 12 .  Needs a new mask that doesn't allow mask to migrate up her face and occlude her right eye as it is doing now   3)  IDA:    referred to GI for evaluation EGD normal.  No colonoscopy was done  because Dr Comer Locket  had done one  I 2017  (she thinks she has had one between 2017 and 2023)  (date unsure) and diagnosed her  an elevated gastric pH was the culprit (too high) .  She has been taking a  once daly supplement with pepcdin,   along with folic acid to increase the acidity of her gut   for the past month .  No change in energy level.      NOT TAKING IRON     Outpatient Medications Prior to Visit  Medication Sig Dispense Refill   amoxicillin (AMOXIL) 500 MG capsule TAKE 4 CAPSULES BY MOUTH ONE HOUR BEFORE APPOINTMENT 4 capsule 0   Barberry-Oreg Grape-Goldenseal (BERBERINE COMPLEX PO) Take 1 capsule by mouth 3 (three) times daily before meals.     Continuous Blood Gluc Receiver (DEXCOM G7 RECEIVER) DEVI Use to check blood sugars. 1 each 0   Continuous Blood Gluc Sensor (DEXCOM G7 SENSOR) MISC Use with Dexcom Receiver. Change sensor every 10 days. 9 each 3   fexofenadine (ALLEGRA) 180 MG tablet Take 180 mg by mouth daily.     glucose blood (FREESTYLE LITE) test strip Use to check blood sugars once daily,  Twice if needed   DM 250.00 102 each 3   insulin glargine (LANTUS SOLOSTAR) 100 UNIT/ML Solostar Pen Inject 25  Units into the skin daily. 15 mL PRN   levothyroxine (SYNTHROID) 88 MCG tablet TAKE 1 TABLET DAILY FOR HYPOTHYROIDISM 90 tablet 3   PARoxetine (PAXIL) 20 MG tablet Take 1 tablet (20 mg total) by mouth daily. 90 tablet 1   telmisartan (MICARDIS) 40 MG tablet TAKE 1 TABLET AT BEDTIME 90 tablet 3   TURMERIC PO Take by mouth daily.     aspirin 81 MG tablet Take 1 tablet (81 mg total) by mouth daily. 30 tablet 11   celecoxib (CELEBREX) 100 MG capsule Take 1 capsule (100 mg total) by mouth 2 (two) times daily. 180 capsule 1   Cholecalciferol 125 MCG (5000 UT) capsule Take 5,000 Units by mouth daily.     Cholecalciferol 50000 units capsule Take 50,000 Units by mouth once a week.     Cyanocobalamin (B-12) 5000 MCG CAPS Take 1 capsule by mouth daily.     magnesium gluconate (MAGONATE) 500 MG tablet Take 500 mg by mouth 2 (two) times daily.     omeprazole (PRILOSEC) 40 MG capsule Take 1 capsule (40 mg total) by mouth daily. 90 capsule 3   thyroid (ARMOUR THYROID) 30 MG tablet  Take 1 tablet (30 mg total) by mouth daily before breakfast. 90 tablet 0   No facility-administered medications prior to visit.    Review of Systems;  Patient denies headache, fevers, malaise, unintentional weight loss, skin rash, eye pain, sinus congestion and sinus pain, sore throat, dysphagia,  hemoptysis , cough, dyspnea, wheezing, chest pain, palpitations, orthopnea, edema, abdominal pain, nausea, melena, diarrhea, constipation, flank pain, dysuria, hematuria, urinary  Frequency, nocturia, numbness, tingling, seizures,  Focal weakness, Loss of consciousness,  Tremor, insomnia, depression, anxiety, and suicidal ideation.      Objective:  BP 122/76 (BP Location: Left Arm, Patient Position: Sitting, Cuff Size: Normal)   Pulse 76   Temp (!) 97.5 F (36.4 C) (Oral)   Ht 5' 3.5" (1.613 m)   Wt 269 lb 6.4 oz (122.2 kg)   SpO2 98%   BMI 46.97 kg/m   BP Readings from Last 3 Encounters:  12/05/21 122/76  08/05/21 122/76   06/05/21 118/78    Wt Readings from Last 3 Encounters:  12/05/21 269 lb 6.4 oz (122.2 kg)  11/28/21 283 lb (128.4 kg)  08/05/21 283 lb 9.6 oz (128.6 kg)    General appearance: alert, cooperative and appears stated age Ears: normal TM's and external ear canals both ears Throat: lips, mucosa, and tongue normal; teeth and gums normal Neck: no adenopathy, no carotid bruit, supple, symmetrical, trachea midline and thyroid not enlarged, symmetric, no tenderness/mass/nodules Back: symmetric, no curvature. ROM normal. No CVA tenderness. Lungs: clear to auscultation bilaterally Heart: regular rate and rhythm, S1, S2 normal, no murmur, click, rub or gallop Abdomen: soft, non-tender; bowel sounds normal; no masses,  no organomegaly Pulses: 2+ and symmetric Skin: Skin color, texture, turgor normal. No rashes or lesions Lymph nodes: Cervical, supraclavicular, and axillary nodes normal. Neuro:  awake and interactive with normal mood and affect. Higher cortical functions are normal. Speech is clear without word-finding difficulty or dysarthria. Extraocular movements are intact. Visual fields of both eyes are grossly intact. Sensation to light touch is grossly intact bilaterally of upper and lower extremities. Motor examination shows 4+/5 symmetric hand grip and upper extremity and 5/5 lower extremity strength. There is no pronation or drift. Gait is non-ataxic   Lab Results  Component Value Date   HGBA1C 9.0 (A) 06/07/2021   HGBA1C 6.0 (A) 08/11/2019   HGBA1C 5.9 (H) 04/15/2018    Lab Results  Component Value Date   CREATININE 0.81 06/05/2021   CREATININE 0.75 08/11/2019   CREATININE 0.88 04/15/2018    Lab Results  Component Value Date   WBC 4.5 08/05/2021   HGB 9.3 (L) 08/05/2021   HCT 29.9 (L) 08/05/2021   PLT 244.0 08/05/2021   GLUCOSE 230 (H) 06/05/2021   CHOL 242 (H) 06/05/2021   TRIG 260.0 (H) 06/05/2021   HDL 52.40 06/05/2021   LDLDIRECT 170.0 06/05/2021   LDLCALC 134 (H)  08/11/2019   ALT 60 (H) 06/05/2021   AST 62 (H) 06/05/2021   NA 135 06/05/2021   K 5.1 06/05/2021   CL 99 06/05/2021   CREATININE 0.81 06/05/2021   BUN 16 06/05/2021   CO2 26 06/05/2021   TSH 0.78 08/05/2021   HGBA1C 9.0 (A) 06/07/2021   MICROALBUR 1.1 06/05/2021    MM 3D SCREEN BREAST BILATERAL  Result Date: 11/22/2017 CLINICAL DATA:  Screening. EXAM: DIGITAL SCREENING BILATERAL MAMMOGRAM WITH TOMO AND CAD COMPARISON:  Previous exam(s). ACR Breast Density Category c: The breast tissue is heterogeneously dense, which may obscure small masses. FINDINGS: There are no  findings suspicious for malignancy. Images were processed with CAD. IMPRESSION: No mammographic evidence of malignancy. A result letter of this screening mammogram will be mailed directly to the patient. RECOMMENDATION: Screening mammogram in one year. (Code:SM-B-01Y) BI-RADS CATEGORY  1: Negative. Electronically Signed   By: Franki Cabot M.D.   On: 11/22/2017 15:19    Assessment & Plan:   Problem List Items Addressed This Visit   None   I spent a total of   minutes with this patient in a face to face visit on the date of this encounter reviewing the last office visit with me in       ,  most recent visit with cardiology ,    ,  patient's diet and exercise habits, home blood pressure /blod sugar readings, recent ER visit including labs and imaging studies ,   and post visit ordering of testing and therapeutics.    Follow-up: No follow-ups on file.   Crecencio Mc, MD

## 2021-12-06 LAB — COMPLETE METABOLIC PANEL WITH GFR
AG Ratio: 1.5 (calc) (ref 1.0–2.5)
ALT: 24 U/L (ref 6–29)
AST: 20 U/L (ref 10–35)
Albumin: 4.1 g/dL (ref 3.6–5.1)
Alkaline phosphatase (APISO): 91 U/L (ref 37–153)
BUN: 12 mg/dL (ref 7–25)
CO2: 23 mmol/L (ref 20–32)
Calcium: 9.2 mg/dL (ref 8.6–10.4)
Chloride: 104 mmol/L (ref 98–110)
Creat: 0.8 mg/dL (ref 0.60–0.95)
Globulin: 2.7 g/dL (calc) (ref 1.9–3.7)
Glucose, Bld: 83 mg/dL (ref 65–99)
Potassium: 4.8 mmol/L (ref 3.5–5.3)
Sodium: 140 mmol/L (ref 135–146)
Total Bilirubin: 0.4 mg/dL (ref 0.2–1.2)
Total Protein: 6.8 g/dL (ref 6.1–8.1)
eGFR: 73 mL/min/{1.73_m2} (ref >=60)

## 2021-12-07 NOTE — Assessment & Plan Note (Addendum)
.   EGD was normal. She remains iron deficient and anemic without iron supplements.   B12 was normal and folate was borderline low. .she was advised to start a folate supplement  And increase the acidity of her stomach acid.  as acid is need to convert dietary iron to an absorbable form.. She does report a history of dark stools earlier in the year. I don't think that repeating her colonoscopy is likely to be of high yield, given that she has kept up with surveillance exams and is hemoccult negative. T Lab Results  Component Value Date   WBC 4.8 12/05/2021   HGB 9.3 (L) 12/05/2021   HCT 29.7 (L) 12/05/2021   MCV 91.1 12/05/2021   PLT 270 12/05/2021    Lab Results  Component Value Date   IRON 36 (L) 12/05/2021   TIBC 413 12/05/2021   FERRITIN 7 (L) 12/05/2021

## 2021-12-07 NOTE — Assessment & Plan Note (Signed)
She has resumed lantus 25 units daily with excellent control .  She has no proteinuria  And is taking an ARB. She is not taking a statin   Lab Results  Component Value Date   HGBA1C 6.6 (H) 12/05/2021   Lab Results  Component Value Date   MICROALBUR 1.1 06/05/2021   MICROALBUR <0.7 12/21/2016

## 2021-12-07 NOTE — Assessment & Plan Note (Signed)
Well controlled on current regimen. Renal function stable, no changes today.  Lab Results  Component Value Date   CREATININE 0.80 12/05/2021   Lab Results  Component Value Date   NA 140 12/05/2021   K 4.8 12/05/2021   CL 104 12/05/2021   CO2 23 12/05/2021    

## 2021-12-08 ENCOUNTER — Encounter: Payer: Self-pay | Admitting: Internal Medicine

## 2021-12-09 ENCOUNTER — Other Ambulatory Visit: Payer: Self-pay | Admitting: Internal Medicine

## 2021-12-09 LAB — CBC WITH DIFFERENTIAL/PLATELET
Absolute Monocytes: 370 cells/uL (ref 200–950)
Basophils Absolute: 38 cells/uL (ref 0–200)
Basophils Relative: 0.8 %
Eosinophils Absolute: 158 cells/uL (ref 15–500)
Eosinophils Relative: 3.3 %
HCT: 29.7 % — ABNORMAL LOW (ref 35.0–45.0)
Hemoglobin: 9.3 g/dL — ABNORMAL LOW (ref 11.7–15.5)
Lymphs Abs: 1378 cells/uL (ref 850–3900)
MCH: 28.5 pg (ref 27.0–33.0)
MCHC: 31.3 g/dL — ABNORMAL LOW (ref 32.0–36.0)
MCV: 91.1 fL (ref 80.0–100.0)
MPV: 12.1 fL (ref 7.5–12.5)
Monocytes Relative: 7.7 %
Neutro Abs: 2856 cells/uL (ref 1500–7800)
Neutrophils Relative %: 59.5 %
Platelets: 270 10*3/uL (ref 140–400)
RBC: 3.26 10*6/uL — ABNORMAL LOW (ref 3.80–5.10)
RDW: 17.8 % — ABNORMAL HIGH (ref 11.0–15.0)
Total Lymphocyte: 28.7 %
WBC: 4.8 10*3/uL (ref 3.8–10.8)

## 2021-12-09 LAB — HEMOGLOBIN A1C
Hgb A1c MFr Bld: 6.6 % of total Hgb — ABNORMAL HIGH (ref <5.7)
Mean Plasma Glucose: 143 mg/dL
eAG (mmol/L): 7.9 mmol/L

## 2021-12-09 LAB — IRON,TIBC AND FERRITIN PANEL
%SAT: 9 % (calc) — ABNORMAL LOW (ref 16–45)
Ferritin: 7 ng/mL — ABNORMAL LOW (ref 16–288)
Iron: 36 ug/dL — ABNORMAL LOW (ref 45–160)
TIBC: 413 mcg/dL (calc) (ref 250–450)

## 2021-12-09 LAB — ERYTHROPOIETIN: Erythropoietin: 312.9 m[IU]/mL — ABNORMAL HIGH (ref 2.6–18.5)

## 2021-12-09 LAB — TSH: TSH: 2.29 mIU/L (ref 0.40–4.50)

## 2021-12-09 LAB — FOLATE RBC: RBC Folate: 772 ng/mL RBC (ref >280)

## 2021-12-22 ENCOUNTER — Other Ambulatory Visit: Payer: Self-pay | Admitting: Internal Medicine

## 2021-12-25 ENCOUNTER — Other Ambulatory Visit: Payer: Self-pay | Admitting: Internal Medicine

## 2022-01-07 ENCOUNTER — Encounter: Payer: Self-pay | Admitting: Internal Medicine

## 2022-01-07 DIAGNOSIS — D509 Iron deficiency anemia, unspecified: Secondary | ICD-10-CM

## 2022-01-12 ENCOUNTER — Telehealth: Payer: Self-pay

## 2022-01-12 NOTE — Telephone Encounter (Signed)
Brenda Floyd called from [employer] to follow-up on a fax she sent on 01/07/2022 requesting a prescription for a  Freestyle libre 3.

## 2022-01-12 NOTE — Telephone Encounter (Signed)
faxed

## 2022-03-09 ENCOUNTER — Ambulatory Visit: Payer: Medicare Other | Admitting: Internal Medicine

## 2022-03-20 ENCOUNTER — Ambulatory Visit (INDEPENDENT_AMBULATORY_CARE_PROVIDER_SITE_OTHER): Payer: Medicare Other | Admitting: Internal Medicine

## 2022-03-20 ENCOUNTER — Encounter: Payer: Self-pay | Admitting: Internal Medicine

## 2022-03-20 VITALS — BP 121/69 | Ht 63.5 in | Wt 269.4 lb

## 2022-03-20 DIAGNOSIS — D509 Iron deficiency anemia, unspecified: Secondary | ICD-10-CM

## 2022-03-20 DIAGNOSIS — E1169 Type 2 diabetes mellitus with other specified complication: Secondary | ICD-10-CM | POA: Diagnosis not present

## 2022-03-20 DIAGNOSIS — E669 Obesity, unspecified: Secondary | ICD-10-CM | POA: Diagnosis not present

## 2022-03-20 DIAGNOSIS — I10 Essential (primary) hypertension: Secondary | ICD-10-CM

## 2022-03-20 DIAGNOSIS — E785 Hyperlipidemia, unspecified: Secondary | ICD-10-CM

## 2022-03-20 NOTE — Progress Notes (Unsigned)
Telephone Visit Note   This format is felt to be most appropriate for this patient at this time.  All issues noted in this document were discussed and addressed.  No physical exam was performed (except for noted visual exam findings with Video Visits).   I attempted to connect  with  Brenda Floyd  on02/09/24 at  4:00 PM EST by a video enabled telemedicine application  and verified that I am speaking with the correct person using two identifiers. Location patient: home Location provider: work or home office Persons participating in the virtual visit: patient, provider  I discussed the limitations, risks, security and privacy concerns of performing an evaluation and management service by telephone and the availability of in person appointments. I also discussed with the patient that there may be a patient responsible charge related to this service. The patient expressed understanding and agreed to proceed.  Interactive audio and video telecommunications were attempted between this provider and patient, however failed, due to patient having technical difficulties .  We continued and completed visit with audio only.   Reason for visit:  diabetes follow up  HPI:  Using 26 units of Lantus,    using freestye libre 2     Sugars between 100 to 150  spike after meals < 150  BP 121/69 pulse 77   Lost a tooth on lower jaw   Got a bridge .  No bleeding  taking iron with meals . No constipation    ROS: See pertinent positives and negatives per HPI.  Past Medical History:  Diagnosis Date   Arthritis    neck, knees, left shoulder   Cancer ([place])    skin ca   Colon polyps 2012   [facility], followup due 2015   Diabetes mellitus    Diverticulosis of sigmoid colon July 2012   by colonoscopy   Dyspnea    Fracture closed, femur, shaft ([place]) remote   s/p screws and rod repair   Hypertension    Thyroid disease     Past Surgical History:  Procedure Laterality Date   ABDOMINAL HYSTERECTOMY     BREAST  CYST ASPIRATION Left    neg   CATARACT EXTRACTION W/PHACO Right 04/06/2017   Procedure: CATARACT EXTRACTION PHACO AND INTRAOCULAR LENS PLACEMENT ([place]) RIGHT DIABETIC;  Surgeon: Eulogio Bear, MD;  Location: [place];  Service: Ophthalmology;  Laterality: Right;   CATARACT EXTRACTION W/PHACO Left 05/04/2017   Procedure: CATARACT EXTRACTION PHACO AND INTRAOCULAR LENS PLACEMENT ([place]) LEFT DIABETIC;  Surgeon: Eulogio Bear, MD;  Location: [place];  Service: Ophthalmology;  Laterality: Left;  DIABETIC   JOINT REPLACEMENT     bilateral knee, 2011    Family History  Problem Relation Age of Onset   Cancer Mother        breast cancer and colon ca   Breast cancer Mother 48   Cancer Father        leukemia   Diabetes Sister    Hypertension Sister    Breast cancer Sister 62   Breast cancer Maternal Aunt    Breast cancer Cousin        maternal 1st cousin    SOCIAL HX: ***   Current Outpatient Medications:    aspirin 81 MG tablet, Take 1 tablet (81 mg total) by mouth daily., Disp: 30 tablet, Rfl: 11   Barberry-Oreg Grape-Goldenseal (BERBERINE COMPLEX PO), Take 1 capsule by mouth 3 (three) times daily before meals., Disp: , Rfl:    Cholecalciferol 125 MCG (  5000 UT) capsule, Take 5,000 Units by mouth daily., Disp: , Rfl:    Continuous Blood Gluc Sensor (FREESTYLE LIBRE 3 SENSOR) MISC, by Does not apply route., Disp: , Rfl:    Cyanocobalamin (B-12) 5000 MCG CAPS, Take 1 capsule by mouth daily., Disp: , Rfl:    fexofenadine (ALLEGRA) 180 MG tablet, Take 180 mg by mouth daily., Disp: , Rfl:    insulin glargine (LANTUS SOLOSTAR) 100 UNIT/ML Solostar Pen, Inject 25 Units into the skin daily. (Patient taking differently: Inject 26 Units into the skin daily.), Disp: 15 mL, Rfl: PRN   levothyroxine (SYNTHROID) 88 MCG tablet, TAKE 1 TABLET DAILY FOR HYPOTHYROIDISM, Disp: 90 tablet, Rfl: 3   magnesium gluconate (MAGONATE) 500 MG tablet, Take 500 mg by mouth 2 (two) times  daily., Disp: , Rfl:    PARoxetine (PAXIL) 20 MG tablet, TAKE 1 TABLET DAILY, Disp: 90 tablet, Rfl: 3   telmisartan (MICARDIS) 40 MG tablet, TAKE 1 TABLET AT BEDTIME, Disp: 90 tablet, Rfl: 3   TURMERIC PO, Take by mouth daily., Disp: , Rfl:    amoxicillin (AMOXIL) 500 MG capsule, TAKE 4 CAPSULES BY MOUTH ONE HOUR BEFORE APPOINTMENT (Patient not taking: Reported on 03/20/2022), Disp: 4 capsule, Rfl: 0  EXAM:  VITALS per patient if applicable:  General appearance: alert, cooperative and articulate.  No signs of being in distress  Lungs: not short of breath ,  No cough, speaking in full sentences  Psych: affect normal,dspeech is articulate and non pressured .  Denies suicidal thoughts     ASSESSMENT AND PLAN: Essential hypertension  Diabetes mellitus type 2 in obese (HCC)  Hyperlipidemia with target LDL less than 70      I discussed the assessment and treatment plan with the patient. The patient was provided an opportunity to ask questions and all were answered. The patient agreed with the plan and demonstrated an understanding of the instructions.   The patient was advised to call back or seek an in-person evaluation if the symptoms worsen or if the condition fails to improve as anticipated.   I spent 22  minutes dedicated to the care of this patient on the date of this telephone encounter to include pre-visit review of his medical history,  non  Face-to-face time with the patient , and post visit ordering of testing and therapeutics.    Crecencio Mc, MD

## 2022-03-22 ENCOUNTER — Encounter: Payer: Self-pay | Admitting: Internal Medicine

## 2022-03-22 NOTE — Assessment & Plan Note (Signed)
She has resumed lantus 26 units daily with excellent control .  She has no proteinuria  And is taking an ARB. She is not taking a statin   Lab Results  Component Value Date   HGBA1C 6.6 (H) 12/05/2021   Lab Results  Component Value Date   MICROALBUR 1.1 06/05/2021   MICROALBUR <0.7 12/21/2016

## 2022-03-22 NOTE — Assessment & Plan Note (Signed)
Well controlled on current regimen. Renal function stable, no changes today.  Lab Results  Component Value Date   CREATININE 0.80 12/05/2021   Lab Results  Component Value Date   NA 140 12/05/2021   K 4.8 12/05/2021   CL 104 12/05/2021   CO2 23 12/05/2021

## 2022-05-27 ENCOUNTER — Encounter: Payer: Self-pay | Admitting: Internal Medicine

## 2022-05-27 MED ORDER — FREESTYLE LIBRE 3 SENSOR MISC: 3 refills | Status: AC

## 2022-07-03 ENCOUNTER — Encounter: Payer: Self-pay | Admitting: Nurse Practitioner

## 2022-07-03 ENCOUNTER — Ambulatory Visit (INDEPENDENT_AMBULATORY_CARE_PROVIDER_SITE_OTHER): Payer: Medicare Other | Admitting: Nurse Practitioner

## 2022-07-03 VITALS — BP 126/84 | HR 66 | Temp 97.6°F | Ht 63.5 in | Wt 267.8 lb

## 2022-07-03 DIAGNOSIS — S40862A Insect bite (nonvenomous) of left upper arm, initial encounter: Secondary | ICD-10-CM | POA: Diagnosis not present

## 2022-07-03 DIAGNOSIS — W57XXXA Bitten or stung by nonvenomous insect and other nonvenomous arthropods, initial encounter: Secondary | ICD-10-CM | POA: Diagnosis not present

## 2022-07-03 MED ORDER — DOXYCYCLINE HYCLATE 100 MG PO TABS: 100.0000 mg | ORAL_TABLET | Freq: Two times a day (BID) | ORAL | 0 refills | Status: AC

## 2022-07-03 NOTE — Progress Notes (Unsigned)
Established Patient Office Visit  Subjective:  Patient ID: Brenda Floyd, female    DOB: 1941-01-21  Age: 82 y.o. MRN: [id number]  CC:  Chief Complaint  Patient presents with   Tick Removal    Pt removed tick on Tuesday. Swelling located on the inside of her left arm.    HPI  Brenda Floyd presents for tick bite. She notice the tick attached to your left upper arm on Tuesday afternoon did not noticed it during the morning while she took bath.  The area she detached the tick is itchy and bruised. She has multiple bruises on the arm from her pets.    HPI   Past Medical History:  Diagnosis Date   Arthritis    neck, knees, left shoulder   Cancer (HCC)    skin ca   Colon polyps 2012   [facility], followup due 2015   Diabetes mellitus    Diverticulosis of sigmoid colon July 2012   by colonoscopy   Dyspnea    Fracture closed, femur, shaft (HCC) remote   s/p screws and rod repair   Hypertension    Thyroid disease     Past Surgical History:  Procedure Laterality Date   ABDOMINAL HYSTERECTOMY     BREAST CYST ASPIRATION Left    neg   CATARACT EXTRACTION W/PHACO Right 04/06/2017   Procedure: CATARACT EXTRACTION PHACO AND INTRAOCULAR LENS PLACEMENT (IOC) RIGHT DIABETIC;  Surgeon: Nevada Crane, MD;  Location: [facility];  Service: Ophthalmology;  Laterality: Right;   CATARACT EXTRACTION W/PHACO Left 05/04/2017   Procedure: CATARACT EXTRACTION PHACO AND INTRAOCULAR LENS PLACEMENT (IOC) LEFT DIABETIC;  Surgeon: Nevada Crane, MD;  Location: [facility];  Service: Ophthalmology;  Laterality: Left;  DIABETIC   JOINT REPLACEMENT     bilateral knee, 2011    Family History  Problem Relation Age of Onset   Cancer Mother        breast cancer and colon ca   Breast cancer Mother 29   Cancer Father        leukemia   Diabetes Sister    Hypertension Sister    Breast cancer Sister 6   Breast cancer Maternal Aunt    Breast cancer Cousin         maternal 1st cousin    Social History   Socioeconomic History   Marital status: Single    Spouse name: Not on file   Number of children: Not on file   Years of education: Not on file   Highest education level: Not on file  Occupational History   Not on file  Tobacco Use   Smoking status: Former    Types: Cigarettes    Quit date: 10/30/1990    Years since quitting: 31.7   Smokeless tobacco: Never  Substance and Sexual Activity   Alcohol use: No   Drug use: No   Sexual activity: Never  Other Topics Concern   Not on file  Social History Narrative   Not on file   Social Determinants of Health   Financial Resource Strain: Low Risk  (11/28/2021)   Overall Financial Resource Strain (CARDIA)    Difficulty of Paying Living Expenses: Not hard at all  Food Insecurity: No Food Insecurity (11/28/2021)   Hunger Vital Sign    Worried About Running Out of Food in the Last Year: Never true    Ran Out of Food in the Last Year: Never true  Transportation Needs: No Transportation Needs (11/28/2021)  PRAPARE - Administrator, Civil Service (Medical): No    Lack of Transportation (Non-Medical): No  Physical Activity: Inactive (11/28/2021)   Exercise Vital Sign    Days of Exercise per Week: 0 days    Minutes of Exercise per Session: 0 min  Stress: No Stress Concern Present (11/28/2021)   [employer] of Occupational Health - Occupational Stress Questionnaire    Feeling of Stress : Not at all  Social Connections: Unknown (11/28/2021)   Social Connection and Isolation Panel [NHANES]    Frequency of Communication with Friends and Family: Not on file    Frequency of Social Gatherings with Friends and Family: More than three times a week    Attends Religious Services: Not on file    Active Member of Clubs or Organizations: Not on file    Attends Banker Meetings: Not on file    Marital Status: Not on file  Intimate Partner Violence: Not At Risk (11/28/2021)    Humiliation, Afraid, Rape, and Kick questionnaire    Fear of Current or Ex-Partner: No    Emotionally Abused: No    Physically Abused: No    Sexually Abused: No     Outpatient Medications Prior to Visit  Medication Sig Dispense Refill   aspirin 81 MG tablet Take 1 tablet (81 mg total) by mouth daily. 30 tablet 11   Barberry-Oreg Grape-Goldenseal (BERBERINE COMPLEX PO) Take 1 capsule by mouth 3 (three) times daily before meals.     Cholecalciferol 125 MCG (5000 UT) capsule Take 5,000 Units by mouth daily.     Continuous Glucose Sensor (FREESTYLE LIBRE 3 SENSOR) MISC Use to check blood sugars continuously 6 each 3   Cyanocobalamin (B-12) 5000 MCG CAPS Take 1 capsule by mouth daily.     fexofenadine (ALLEGRA) 180 MG tablet Take 180 mg by mouth daily.     insulin glargine (LANTUS SOLOSTAR) 100 UNIT/ML Solostar Pen Inject 25 Units into the skin daily. (Patient taking differently: Inject 26 Units into the skin daily.) 15 mL PRN   levothyroxine (SYNTHROID) 88 MCG tablet TAKE 1 TABLET DAILY FOR HYPOTHYROIDISM 90 tablet 3   magnesium gluconate (MAGONATE) 500 MG tablet Take 500 mg by mouth 2 (two) times daily.     PARoxetine (PAXIL) 20 MG tablet TAKE 1 TABLET DAILY 90 tablet 3   telmisartan (MICARDIS) 40 MG tablet TAKE 1 TABLET AT BEDTIME 90 tablet 3   TURMERIC PO Take by mouth daily.     amoxicillin (AMOXIL) 500 MG capsule TAKE 4 CAPSULES BY MOUTH ONE HOUR BEFORE APPOINTMENT 4 capsule 0   No facility-administered medications prior to visit.    Allergies  Allergen Reactions   Crestor [Rosuvastatin]     Myalgias and heart burn     ROS Review of Systems  Skin:        Bruise and tickbite      Objective:    Physical Exam Constitutional:      Appearance: Normal appearance.  Cardiovascular:     Pulses: Normal pulses.     Heart sounds: Normal heart sounds.  Pulmonary:     Effort: Pulmonary effort is normal.     Breath sounds: Normal breath sounds. No wheezing.  Skin:          Comments: Bruising to the area.  Neurological:     General: No focal deficit present.     Mental Status: She is alert and oriented to person, place, and time. Mental status is at baseline.  Psychiatric:        Mood and Affect: Mood normal.        Behavior: Behavior normal.        Thought Content: Thought content normal.        Judgment: Judgment normal.     BP 126/84 (BP Location: Right Arm)   Pulse 66   Temp 97.6 F (36.4 C) (Temporal)   Ht 5' 3.5" (1.613 m)   Wt 267 lb 12.8 oz (121.5 kg)   SpO2 97%   BMI 46.69 kg/m  Wt Readings from Last 3 Encounters:  07/03/22 267 lb 12.8 oz (121.5 kg)  03/20/22 269 lb 6.4 oz (122.2 kg)  12/05/21 269 lb 6.4 oz (122.2 kg)     Health Maintenance  Topic Date Due   DTaP/Tdap/Td (3 - Td or Tdap) 11/08/2021   FOOT EXAM  06/06/2022   HEMOGLOBIN A1C  06/06/2022   Diabetic kidney evaluation - Urine ACR  08/03/2022 (Originally 06/06/2022)   MAMMOGRAM  08/03/2022 (Originally 11/23/2018)   OPHTHALMOLOGY EXAM  08/03/2022 (Originally 01/15/2018)   INFLUENZA VACCINE  09/10/2022   Medicare Annual Wellness (AWV)  11/29/2022   Diabetic kidney evaluation - eGFR measurement  12/06/2022   Pneumonia Vaccine 29+ Years old  Completed   DEXA SCAN  Completed   HPV VACCINES  Aged Out   COVID-19 Vaccine  Discontinued   Zoster Vaccines- Shingrix  Discontinued    There are no preventive care reminders to display for this patient.  Lab Results  Component Value Date   TSH 2.29 12/05/2021   Lab Results  Component Value Date   WBC 4.8 12/05/2021   HGB 9.3 (L) 12/05/2021   HCT 29.7 (L) 12/05/2021   MCV 91.1 12/05/2021   PLT 270 12/05/2021   Lab Results  Component Value Date   NA 140 12/05/2021   K 4.8 12/05/2021   CO2 23 12/05/2021   GLUCOSE 83 12/05/2021   BUN 12 12/05/2021   CREATININE 0.80 12/05/2021   BILITOT 0.4 12/05/2021   ALKPHOS 73 06/05/2021   AST 20 12/05/2021   ALT 24 12/05/2021   PROT 6.8 12/05/2021   ALBUMIN 4.3 06/05/2021    CALCIUM 9.2 12/05/2021   EGFR 73 12/05/2021   GFR 67.40 06/05/2021   Lab Results  Component Value Date   CHOL 242 (H) 06/05/2021   Lab Results  Component Value Date   HDL 52.40 06/05/2021   Lab Results  Component Value Date   LDLCALC 134 (H) 08/11/2019   Lab Results  Component Value Date   TRIG 260.0 (H) 06/05/2021   Lab Results  Component Value Date   CHOLHDL 5 06/05/2021   Lab Results  Component Value Date   HGBA1C 6.6 (H) 12/05/2021      Assessment & Plan:  Tick bite of left upper arm, initial encounter Assessment & Plan: Started on doxycycline. Doxycycline should be taken with food to prevent nausea. Should not lie down for 30 minutes after taking the medication. Be cautious with sun exposure.   Other orders -     Doxycycline Hyclate; Take 1 tablet (100 mg total) by mouth 2 (two) times daily for 7 days.  Dispense: 14 tablet; Refill: 0    Follow-up: Return if symptoms worsen or fail to improve.   Kara Dies, NP

## 2022-07-03 NOTE — Patient Instructions (Addendum)
Rx sent to pharmacy   Doxycycline Capsules or Tablets What is this medication? DOXYCYCLINE (dox i SYE kleen) treats infections caused by bacteria. It belongs to a group of medications called tetracycline antibiotics. It will not treat colds, the flu, or infections caused by viruses. This medicine may be used for other purposes; ask your health care provider or pharmacist if you have questions. COMMON BRAND NAME(S): Acticlate, Adoxa, Adoxa CK, Adoxa Pak, Adoxa TT, Alodox, Avidoxy, Doxal, LYMEPAK, Mondoxyne NL, Monodox, Morgidox 1x, Morgidox 1x Kit, Morgidox 2x, Morgidox 2x Kit, NutriDox, Ocudox, [place], [place], Ball, Vibra-Tabs, Vibramycin What should I tell my care team before I take this medication? They need to know if you have any of these conditions: Kidney disease Liver disease Long exposure to sunlight like working outdoors Recent stomach surgery Stomach or intestine problems, such as colitis Vision problems Yeast or fungal infection of the mouth or vagina An unusual or allergic reaction to doxycycline, other medications, foods, dyes, or preservatives Pregnant or trying to get pregnant Breastfeeding How should I use this medication? Take this medication by mouth with water. Take it as directed on the prescription label at the same time every day. It is best to take this medication without food, but if it upsets your stomach take it with food. Take all of this medication unless your care team tells you to stop it early. Keep taking it even if you think you are better. Take antacids and products with aluminum, calcium, magnesium, iron, and zinc in them at a different time of day than this medication. Talk to your care team if you have questions. Talk to your care team about the use of this medication in children. While it may be prescribed for children for selected conditions, precautions do apply. Overdosage: If you think you have taken too much of this medicine contact a poison  control center or emergency room at once. NOTE: This medicine is only for you. Do not share this medicine with others. What if I miss a dose? If you miss a dose, take it as soon as you can. If it is almost time for your next dose, take only that dose. Do not take double or extra doses. What may interact with this medication? Antacids, vitamins, or other products that contain aluminum, calcium, iron, magnesium, or zinc Barbiturates Bismuth subsalicylate Carbamazepine Estrogen or progestin hormones Methoxyflurane Oral retinoids, such as acitretin, isotretinoin Other antibiotics Phenytoin Warfarin This list may not describe all possible interactions. Give your health care provider a list of all the medicines, herbs, non-prescription drugs, or dietary supplements you use. Also tell them if you smoke, drink alcohol, or use illegal drugs. Some items may interact with your medicine. What should I watch for while using this medication? Tell your care team if your symptoms do not improve. Do not treat diarrhea with over the counter products. Contact your care team if you have diarrhea that lasts more than 2 days or if it is severe and watery. Do not take this medication just before going to bed. It may not dissolve properly when you lay down and can cause pain in your throat. Drink plenty of fluids while taking this medication to also help reduce irritation in your throat. This medication can make you more sensitive to the sun. Keep out of the sun. If you cannot avoid being in the sun, wear protective clothing and sunscreen. Do not use sun lamps, tanning beds, or tanning booths. Estrogen and progestin hormones may not work as well  while you are taking this medication. A barrier contraceptive, such as a condom or diaphragm, is recommended if you are using these hormones for contraception. Talk to your care team about effective forms of contraception. If you are being treated for a sexually transmitted  infection (STI), avoid sexual contact until you have finished your treatment. Your sexual partner may also need treatment. If you are using this medication to prevent malaria, you should still protect yourself from contact with mosquitos. Stay in screened-in areas, use mosquito nets, keep your body covered, and use an insect repellent. What side effects may I notice from receiving this medication? Side effects that you should report to your care team as soon as possible: Allergic reactions--skin rash, itching, hives, swelling of the face, lips, tongue, or throat Increased pressure around the brain--severe headache, change in vision, blurry vision, nausea, vomiting Joint pain Pain or trouble swallowing Redness, blistering, peeling, or loosening of the skin, including inside the mouth Severe diarrhea, fever Unusual vaginal discharge, itching, or odor Side effects that usually do not require medical attention (report these to your care team if they continue or are bothersome): Change in tooth color Diarrhea Headache Heartburn Nausea This list may not describe all possible side effects. Call your doctor for medical advice about side effects. You may report side effects to FDA at 1-800-FDA-1088. Where should I keep my medication? Keep out of the reach of children and pets. Store at room temperature, below 30 degrees C (86 degrees F). Protect from light. Keep container tightly closed. Throw away any unused medication after the expiration date. Taking this medication after the expiration date can make you seriously ill. NOTE: This sheet is a summary. It may not cover all possible information. If you have questions about this medicine, talk to your doctor, pharmacist, or health care provider.  2024 Elsevier/Gold Standard (2021-10-29 00:00:00)

## 2022-07-05 DIAGNOSIS — S40862A Insect bite (nonvenomous) of left upper arm, initial encounter: Secondary | ICD-10-CM | POA: Insufficient documentation

## 2022-07-05 NOTE — Assessment & Plan Note (Addendum)
Started on doxycycline. Doxycycline should be taken with food to prevent nausea. Should not lie down for 30 minutes after taking the medication. Be cautious with sun exposure.

## 2022-08-24 ENCOUNTER — Ambulatory Visit (INDEPENDENT_AMBULATORY_CARE_PROVIDER_SITE_OTHER): Payer: Medicare Other | Admitting: Internal Medicine

## 2022-08-24 ENCOUNTER — Encounter: Payer: Self-pay | Admitting: Internal Medicine

## 2022-08-24 VITALS — BP 124/68 | HR 119 | Temp 98.1°F | Ht 63.5 in | Wt 263.8 lb

## 2022-08-24 DIAGNOSIS — G4733 Obstructive sleep apnea (adult) (pediatric): Secondary | ICD-10-CM

## 2022-08-24 DIAGNOSIS — D649 Anemia, unspecified: Secondary | ICD-10-CM

## 2022-08-24 DIAGNOSIS — D509 Iron deficiency anemia, unspecified: Secondary | ICD-10-CM

## 2022-08-24 DIAGNOSIS — E119 Type 2 diabetes mellitus without complications: Secondary | ICD-10-CM

## 2022-08-24 DIAGNOSIS — Z794 Long term (current) use of insulin: Secondary | ICD-10-CM

## 2022-08-24 DIAGNOSIS — E034 Atrophy of thyroid (acquired): Secondary | ICD-10-CM

## 2022-08-24 DIAGNOSIS — E538 Deficiency of other specified B group vitamins: Secondary | ICD-10-CM

## 2022-08-24 DIAGNOSIS — E785 Hyperlipidemia, unspecified: Secondary | ICD-10-CM

## 2022-08-24 MED ORDER — TRIAMCINOLONE ACETONIDE 0.5 % EX OINT: 1.0000 | TOPICAL_OINTMENT | Freq: Two times a day (BID) | CUTANEOUS | 1 refills | Status: DC

## 2022-08-24 NOTE — Progress Notes (Unsigned)
Subjective:  Patient ID: Brenda Floyd, female    DOB: [birth year]  Age: 82 y.o. MRN: [id number]  CC: {There were no encounter diagnoses. (Refresh or delete this SmartLink)}   HPI Brenda Floyd presents for  Chief Complaint  Patient presents with   Medical Management of Chronic Issues   T2DM:  She  feels generally well,  is not  exercising regularly and  trying to lose weight.  Taking  25 units of  Lantus daily directed. Following a carbohydrate modified diet 6 days per week. Denies numbness, burning and tingling of extremities. Appetite is good. I have downloaded and reviewed the data from patient's continuous blood glucose monitor. Patient's  sugars have been  IN RANGE  100   % OF THE TIME,   .  Medication changes were made based on this review as follows: none   Urinary incontinence  Had a contact dermatitis involving both feet  from some weeks   Was red and itchy.    Outpatient Medications Prior to Visit  Medication Sig Dispense Refill   aspirin 81 MG tablet Take 1 tablet (81 mg total) by mouth daily. 30 tablet 11   Barberry-Oreg Grape-Goldenseal (BERBERINE COMPLEX PO) Take 1 capsule by mouth 3 (three) times daily before meals.     Cholecalciferol 125 MCG (5000 UT) capsule Take 5,000 Units by mouth daily.     Continuous Glucose Sensor (FREESTYLE LIBRE 3 SENSOR) MISC Use to check blood sugars continuously 6 each 3   Cyanocobalamin (B-12) 5000 MCG CAPS Take 1 capsule by mouth daily.     fexofenadine (ALLEGRA) 180 MG tablet Take 180 mg by mouth daily.     insulin glargine (LANTUS SOLOSTAR) 100 UNIT/ML Solostar Pen Inject 25 Units into the skin daily. (Patient taking differently: Inject 26 Units into the skin daily.) 15 mL PRN   levothyroxine (SYNTHROID) 88 MCG tablet TAKE 1 TABLET DAILY FOR HYPOTHYROIDISM 90 tablet 3   magnesium gluconate (MAGONATE) 500 MG tablet Take 500 mg by mouth 2 (two) times daily.     PARoxetine (PAXIL) 20 MG tablet TAKE 1 TABLET DAILY 90 tablet 3    telmisartan (MICARDIS) 40 MG tablet TAKE 1 TABLET AT BEDTIME 90 tablet 3   TURMERIC PO Take by mouth daily.     No facility-administered medications prior to visit.    Review of Systems;  Patient denies headache, fevers, malaise, unintentional weight loss, skin rash, eye pain, sinus congestion and sinus pain, sore throat, dysphagia,  hemoptysis , cough, dyspnea, wheezing, chest pain, palpitations, orthopnea, edema, abdominal pain, nausea, melena, diarrhea, constipation, flank pain, dysuria, hematuria, urinary  Frequency, nocturia, numbness, tingling, seizures,  Focal weakness, Loss of consciousness,  Tremor, insomnia, depression, anxiety, and suicidal ideation.      Objective:  BP 124/68   Pulse (!) 119   Temp 98.1 F (36.7 C) (Oral)   Ht 5' 3.5" (1.613 m)   Wt 263 lb 12.8 oz (119.7 kg)   SpO2 92%   BMI 46.00 kg/m   BP Readings from Last 3 Encounters:  08/24/22 124/68  07/03/22 126/84  03/20/22 121/69    Wt Readings from Last 3 Encounters:  08/24/22 263 lb 12.8 oz (119.7 kg)  07/03/22 267 lb 12.8 oz (121.5 kg)  03/20/22 269 lb 6.4 oz (122.2 kg)    Physical Exam  Lab Results  Component Value Date   HGBA1C 6.6 (H) 12/05/2021   HGBA1C 9.0 (A) 06/07/2021   HGBA1C 6.0 (A) 08/11/2019    Lab Results  Component Value Date   CREATININE 0.80 12/05/2021   CREATININE 0.81 06/05/2021   CREATININE 0.75 08/11/2019    Lab Results  Component Value Date   WBC 4.8 12/05/2021   HGB 9.3 (L) 12/05/2021   HCT 29.7 (L) 12/05/2021   PLT 270 12/05/2021   GLUCOSE 83 12/05/2021   CHOL 242 (H) 06/05/2021   TRIG 260.0 (H) 06/05/2021   HDL 52.40 06/05/2021   LDLDIRECT 170.0 06/05/2021   LDLCALC 134 (H) 08/11/2019   ALT 24 12/05/2021   AST 20 12/05/2021   NA 140 12/05/2021   K 4.8 12/05/2021   CL 104 12/05/2021   CREATININE 0.80 12/05/2021   BUN 12 12/05/2021   CO2 23 12/05/2021   TSH 2.29 12/05/2021   HGBA1C 6.6 (H) 12/05/2021   MICROALBUR 1.1 06/05/2021    MM 3D SCREEN  BREAST BILATERAL  Result Date: 11/22/2017 CLINICAL DATA:  Screening. EXAM: DIGITAL SCREENING BILATERAL MAMMOGRAM WITH TOMO AND CAD COMPARISON:  Previous exam(s). ACR Breast Density Category c: The breast tissue is heterogeneously dense, which may obscure small masses. FINDINGS: There are no findings suspicious for malignancy. Images were processed with CAD. IMPRESSION: No mammographic evidence of malignancy. A result letter of this screening mammogram will be mailed directly to the patient. RECOMMENDATION: Screening mammogram in one year. (Code:SM-B-01Y) BI-RADS CATEGORY  1: Negative. Electronically Signed   By: Bary Richard M.D.   On: 11/22/2017 15:19    Assessment & Plan:  .There are no diagnoses linked to this encounter.   I provided 30 minutes of face-to-face time during this encounter reviewing patient's last visit with me, patient's  most recent visit with cardiology,  nephrology,  and neurology,  recent surgical and non surgical procedures, previous  labs and imaging studies, counseling on currently addressed issues,  and post visit ordering to diagnostics and therapeutics .   Follow-up: No follow-ups on file.   Sherlene Shams, MD

## 2022-08-24 NOTE — Patient Instructions (Addendum)
You are doing EXCELLENT!  Change nothing!  KEEP AN EYE ON YOUR PULSE.  IT'S REGULAR BUT IF IT STAYS > 100 RETURN FOR AN EKG

## 2022-08-25 LAB — CBC WITH DIFFERENTIAL/PLATELET
Basophils Absolute: 0.1 10*3/uL (ref 0.0–0.1)
Basophils Relative: 1.8 % (ref 0.0–3.0)
Eosinophils Absolute: 0.3 10*3/uL (ref 0.0–0.7)
Eosinophils Relative: 6.5 % — ABNORMAL HIGH (ref 0.0–5.0)
HCT: 26.2 % — ABNORMAL LOW (ref 36.0–46.0)
Hemoglobin: 8.2 g/dL — ABNORMAL LOW (ref 12.0–15.0)
Lymphocytes Relative: 26.6 % (ref 12.0–46.0)
Lymphs Abs: 1.2 10*3/uL (ref 0.7–4.0)
MCHC: 31.2 g/dL (ref 30.0–36.0)
MCV: 92.8 fl (ref 78.0–100.0)
Monocytes Absolute: 0.4 10*3/uL (ref 0.1–1.0)
Monocytes Relative: 9.2 % (ref 3.0–12.0)
Neutro Abs: 2.5 10*3/uL (ref 1.4–7.7)
Neutrophils Relative %: 55.9 % (ref 43.0–77.0)
Platelets: 242 10*3/uL (ref 150.0–400.0)
RBC: 2.82 Mil/uL — ABNORMAL LOW (ref 3.87–5.11)
RDW: 19.2 % — ABNORMAL HIGH (ref 11.5–15.5)
WBC: 4.6 10*3/uL (ref 4.0–10.5)

## 2022-08-25 LAB — LIPID PANEL
Cholesterol: 223 mg/dL — ABNORMAL HIGH (ref 0–200)
HDL: 48.8 mg/dL (ref >39.00)
NonHDL: 174.55
Total CHOL/HDL Ratio: 5
Triglycerides: 291 mg/dL — ABNORMAL HIGH (ref 0.0–149.0)
VLDL: 58.2 mg/dL — ABNORMAL HIGH (ref 0.0–40.0)

## 2022-08-25 LAB — COMPREHENSIVE METABOLIC PANEL
ALT: 17 U/L (ref 0–35)
AST: 16 U/L (ref 0–37)
Albumin: 4.3 g/dL (ref 3.5–5.2)
Alkaline Phosphatase: 56 U/L (ref 39–117)
BUN: 26 mg/dL — ABNORMAL HIGH (ref 6–23)
CO2: 25 mEq/L (ref 19–32)
Calcium: 9.5 mg/dL (ref 8.4–10.5)
Chloride: 106 mEq/L (ref 96–112)
Creatinine, Ser: 0.97 mg/dL (ref 0.40–1.20)
GFR: 53.82 mL/min — ABNORMAL LOW (ref >60.00)
Glucose, Bld: 103 mg/dL — ABNORMAL HIGH (ref 70–99)
Potassium: 4.4 mEq/L (ref 3.5–5.1)
Sodium: 139 mEq/L (ref 135–145)
Total Bilirubin: 0.4 mg/dL (ref 0.2–1.2)
Total Protein: 7.3 g/dL (ref 6.0–8.3)

## 2022-08-25 LAB — HEMOGLOBIN A1C: Hgb A1c MFr Bld: 6 % (ref 4.6–6.5)

## 2022-08-25 LAB — IBC + FERRITIN
Ferritin: 7.2 ng/mL — ABNORMAL LOW (ref 10.0–291.0)
Iron: 38 ug/dL — ABNORMAL LOW (ref 42–145)
Saturation Ratios: 7.8 % — ABNORMAL LOW (ref 20.0–50.0)
TIBC: 490 ug/dL — ABNORMAL HIGH (ref 250.0–450.0)
Transferrin: 350 mg/dL (ref 212.0–360.0)

## 2022-08-25 LAB — TSH: TSH: 1.73 u[IU]/mL (ref 0.35–5.50)

## 2022-08-25 LAB — LDL CHOLESTEROL, DIRECT: Direct LDL: 140 mg/dL

## 2022-08-25 NOTE — Assessment & Plan Note (Signed)
 Diagnosed by sleep study. She is wearing her CPAP every night a minimum of 6 hours per night and notes improved daytime wakefulness and decreased fatigue  

## 2022-08-25 NOTE — Assessment & Plan Note (Signed)
Managed with monthly IM injections. likley due to metformin ,  Will recheck next OV since she has stopped metformin,   Lab Results  Component Value Date   [id number] 08/05/2021

## 2022-08-25 NOTE — Assessment & Plan Note (Signed)
Recommending use of Ozempic to manage obesity and diabetes.  She has no contraindications to use of GLP 1 agonists but declines therapy as she is losing weight gradually on her own

## 2022-08-26 ENCOUNTER — Encounter: Payer: Self-pay | Admitting: Internal Medicine

## 2022-08-26 MED ORDER — IRON (FERROUS SULFATE) 325 (65 FE) MG PO TABS: 325.0000 mg | ORAL_TABLET | Freq: Every day | ORAL | 1 refills | Status: DC

## 2022-08-26 NOTE — Assessment & Plan Note (Signed)
Worsening.  Reviewed prior GI evaluation .  Will repeat her hemoccult test and start iron supplementation  Lab Results  Component Value Date   WBC 4.6 08/24/2022   HGB 8.2 Repeated and verified X2. (L) 08/24/2022   HCT 26.2 Repeated and verified X2. (L) 08/24/2022   MCV 92.8 08/24/2022   PLT 242.0 08/24/2022    Lab Results  Component Value Date   IRON 38 (L) 08/24/2022   TIBC 490.0 (H) 08/24/2022   FERRITIN 7.2 (L) 08/24/2022

## 2022-08-26 NOTE — Addendum Note (Signed)
Addended by: Warden Fillers on: 08/26/2022 09:34 AM   Modules accepted: Orders

## 2022-08-26 NOTE — Addendum Note (Signed)
Addended by: Sherlene Shams on: 08/26/2022 06:54 PM   Modules accepted: Orders, Level of Service

## 2022-09-01 ENCOUNTER — Encounter: Payer: Self-pay | Admitting: Internal Medicine

## 2022-09-02 ENCOUNTER — Other Ambulatory Visit: Payer: Self-pay | Admitting: Internal Medicine

## 2022-09-13 ENCOUNTER — Encounter: Payer: Self-pay | Admitting: Internal Medicine

## 2022-09-15 NOTE — Telephone Encounter (Signed)
Pt is scheduled for tomorrow at 1130 with provider

## 2022-09-15 NOTE — Telephone Encounter (Signed)
noted 

## 2022-09-15 NOTE — Telephone Encounter (Signed)
Attempted to call pt. No answer no voicemail. Pt will need to schedule an appt to discuss.

## 2022-09-16 ENCOUNTER — Ambulatory Visit (INDEPENDENT_AMBULATORY_CARE_PROVIDER_SITE_OTHER): Payer: Medicare Other | Admitting: Internal Medicine

## 2022-09-16 VITALS — BP 132/86 | HR 85 | Ht 63.5 in | Wt 261.4 lb

## 2022-09-16 DIAGNOSIS — Z794 Long term (current) use of insulin: Secondary | ICD-10-CM

## 2022-09-16 DIAGNOSIS — B839 Helminthiasis, unspecified: Secondary | ICD-10-CM

## 2022-09-16 DIAGNOSIS — E119 Type 2 diabetes mellitus without complications: Secondary | ICD-10-CM | POA: Diagnosis not present

## 2022-09-16 DIAGNOSIS — R197 Diarrhea, unspecified: Secondary | ICD-10-CM

## 2022-09-16 DIAGNOSIS — D509 Iron deficiency anemia, unspecified: Secondary | ICD-10-CM | POA: Diagnosis not present

## 2022-09-16 DIAGNOSIS — R195 Other fecal abnormalities: Secondary | ICD-10-CM | POA: Insufficient documentation

## 2022-09-16 LAB — MICROALBUMIN / CREATININE URINE RATIO
Creatinine,U: 70.6 mg/dL
Microalb Creat Ratio: 1 mg/g (ref 0.0–30.0)
Microalb, Ur: <0.7 mg/dL (ref 0.0–1.9)

## 2022-09-16 NOTE — Assessment & Plan Note (Addendum)
Patient reports seeing worms in her stool and has risk factors, including an open sore and farm animals.  I have ordered OVA AND PARASITES AND GI PATHOGEN PANELS  to be done prior to initiating any treatment .  She is at increased risk due to the farm animals on her property that she is in contact with.

## 2022-09-16 NOTE — Assessment & Plan Note (Addendum)
TAKING IRON .  She has yet to submit a stool specimen for FOBT testing.   ORDERED

## 2022-09-16 NOTE — Progress Notes (Addendum)
Subjective:  Patient ID: Brenda Floyd, female    DOB: [birth year]  Age: 82 y.o. MRN: [id number]  CC: The primary encounter diagnosis was Worms in stool. Diagnoses of Type 2 diabetes mellitus without complication, with long-term current use of insulin (HCC), Iron deficiency anemia, unspecified iron deficiency anemia type, Diarrhea of presumed infectious origin, and Type 2 diabetes mellitus with insulin therapy (HCC) were also pertinent to this visit.   HPI Brenda Floyd presents for  Chief Complaint  Patient presents with   possible worms in her stool    Brenda Floyd is an 82 yr old retired [employer] fund[employer] with a history of type 2 DM /morbid obesity who   presents with recent sighting of "worms " in her stool. .  She thinks she saw  sighting around July 22 of a large cyst like structure in her stool with worms coming out of it. She has seen worms twice thus far.  Stools are formed  and she has lost 2 lbs since July 15.  No gi upset.  Takes iron .  No rectal itching,  no rashes , but  recently had an open wound (described as a "a sore" on her leg)  that has subsequently healed.     Her sister has not had any sightings.  No change to pattern of BMs.  No recent travel, no recent use of prednisone .  Does not raise her own vegetables  does not eat raw fish or raw beef.  raises chickens,  geese, ducks and two dogs  and 3 in house birds. The  chickns have recently died;  she is down to one rooster  who  does not get wormed, but her the dogs do. Get regularly wormed.   Outpatient Medications Prior to Visit  Medication Sig Dispense Refill   aspirin 81 MG tablet Take 1 tablet (81 mg total) by mouth daily. 30 tablet 11   Barberry-Oreg Grape-Goldenseal (BERBERINE COMPLEX PO) Take 1 capsule by mouth 3 (three) times daily before meals.     Cholecalciferol 125 MCG (5000 UT) capsule Take 5,000 Units by mouth daily.     Continuous Glucose Sensor (FREESTYLE LIBRE 3 SENSOR) MISC Use to check blood sugars continuously 6  each 3   Cyanocobalamin (B-12) 5000 MCG CAPS Take 1 capsule by mouth daily.     fexofenadine (ALLEGRA) 180 MG tablet Take 180 mg by mouth daily.     Iron, Ferrous Sulfate, 325 (65 Fe) MG TABS Take 325 mg by mouth daily. 90 tablet 1   LANTUS SOLOSTAR 100 UNIT/ML Solostar Pen INJECT 25 UNITS UNDER THE SKIN DAILY 15 mL 5   magnesium gluconate (MAGONATE) 500 MG tablet Take 500 mg by mouth 2 (two) times daily.     PARoxetine (PAXIL) 20 MG tablet TAKE 1 TABLET DAILY 90 tablet 3   telmisartan (MICARDIS) 40 MG tablet TAKE 1 TABLET AT BEDTIME 90 tablet 3   TURMERIC PO Take by mouth daily.     levothyroxine (SYNTHROID) 88 MCG tablet TAKE 1 TABLET DAILY FOR HYPOTHYROIDISM 90 tablet 3   triamcinolone ointment (KENALOG) 0.5 % Apply 1 Application topically 2 (two) times daily. 180 g 1   No facility-administered medications prior to visit.    Review of Systems;  Patient denies headache, fevers, malaise, unintentional weight loss, skin rash, eye pain, sinus congestion and sinus pain, sore throat, dysphagia,  hemoptysis , cough, dyspnea, wheezing, chest pain, palpitations, orthopnea, edema, abdominal pain, nausea, melena, diarrhea, constipation, flank pain, dysuria, hematuria, urinary  Frequency, nocturia, numbness, tingling, seizures,  Focal weakness, Loss of consciousness,  Tremor, insomnia, depression, anxiety, and suicidal ideation.      Objective:  BP 132/86   Pulse 85   Ht 5' 3.5" (1.613 m)   Wt 261 lb 6.4 oz (118.6 kg)   SpO2 92%   BMI 45.58 kg/m   BP Readings from Last 3 Encounters:  09/16/22 132/86  08/24/22 124/68  07/03/22 126/84    Wt Readings from Last 3 Encounters:  12/02/22 267 lb (121.1 kg)  09/16/22 261 lb 6.4 oz (118.6 kg)  08/24/22 263 lb 12.8 oz (119.7 kg)    Physical Exam Vitals reviewed.  Constitutional:      General: She is not in acute distress.    Appearance: Normal appearance. She is normal weight. She is not ill-appearing, toxic-appearing or diaphoretic.   HENT:     Head: Normocephalic.  Eyes:     General: No scleral icterus.       Right eye: No discharge.        Left eye: No discharge.     Conjunctiva/sclera: Conjunctivae normal.  Cardiovascular:     Rate and Rhythm: Normal rate and regular rhythm.     Heart sounds: Normal heart sounds.  Pulmonary:     Effort: Pulmonary effort is normal. No respiratory distress.     Breath sounds: Normal breath sounds.  Musculoskeletal:        General: Normal range of motion.  Skin:    General: Skin is warm and dry.  Neurological:     General: No focal deficit present.     Mental Status: She is alert and oriented to person, place, and time. Mental status is at baseline.  Psychiatric:        Mood and Affect: Mood normal.        Behavior: Behavior normal.        Thought Content: Thought content normal.        Judgment: Judgment normal.    Lab Results  Component Value Date   HGBA1C 6.0 08/24/2022   HGBA1C 6.6 (H) 12/05/2021   HGBA1C 9.0 (A) 06/07/2021    Lab Results  Component Value Date   CREATININE 0.97 08/24/2022   CREATININE 0.80 12/05/2021   CREATININE 0.81 06/05/2021    Lab Results  Component Value Date   WBC 4.6 08/24/2022   HGB 8.2 Repeated and verified X2. (L) 08/24/2022   HCT 26.2 Repeated and verified X2. (L) 08/24/2022   PLT 242.0 08/24/2022   GLUCOSE 103 (H) 08/24/2022   CHOL 223 (H) 08/24/2022   TRIG 291.0 (H) 08/24/2022   HDL 48.80 08/24/2022   LDLDIRECT 140.0 08/24/2022   LDLCALC 134 (H) 08/11/2019   ALT 17 08/24/2022   AST 16 08/24/2022   NA 139 08/24/2022   K 4.4 08/24/2022   CL 106 08/24/2022   CREATININE 0.97 08/24/2022   BUN 26 (H) 08/24/2022   CO2 25 08/24/2022   TSH 1.73 08/24/2022   HGBA1C 6.0 08/24/2022   MICROALBUR <0.7 09/16/2022    MM 3D SCREEN BREAST BILATERAL  Result Date: 11/22/2017 CLINICAL DATA:  Screening. EXAM: DIGITAL SCREENING BILATERAL MAMMOGRAM WITH TOMO AND CAD COMPARISON:  Previous exam(s). ACR Breast Density Category c: The  breast tissue is heterogeneously dense, which may obscure small masses. FINDINGS: There are no findings suspicious for malignancy. Images were processed with CAD. IMPRESSION: No mammographic evidence of malignancy. A result letter of this screening mammogram will be mailed directly to the patient. RECOMMENDATION: Screening mammogram  in one year. (Code:SM-B-01Y) BI-RADS CATEGORY  1: Negative. Electronically Signed   By: Bary Richard M.D.   On: 11/22/2017 15:19    Assessment & Plan:  .Worms in stool Assessment & Plan: Patient reports seeing worms in her stool and has risk factors, including an open sore and farm animals.  I have ordered OVA AND PARASITES AND GI PATHOGEN PANELS  to be done prior to initiating any treatment .  She is at increased risk due to the farm animals on her property that she is in contact with.   Orders: -     Ova and parasite examination; Future -     GI pathogen panel by PCR, stool; Future  Type 2 diabetes mellitus without complication, with long-term current use of insulin (HCC) -     Microalbumin / creatinine urine ratio  Iron deficiency anemia, unspecified iron deficiency anemia type Assessment & Plan: TAKING IRON .  She has yet to submit a stool specimen for FOBT testing.   ORDERED   Orders: -     Fecal occult blood, imunochemical; Future  Diarrhea of presumed infectious origin Assessment & Plan: SHE HAS HAD SEVERAL LOOSE STOOLS IN THE SETTING OF EXPOSURE TO FARM ANIMALS AND RAW EGGS.  Gi PATHOGEN PANEL ORDERED   Orders: -     GI pathogen panel by PCR, stool; Future  Type 2 diabetes mellitus with insulin therapy [facility]) Assessment & Plan: She is taking insulin.  She has resumed lantus 26 units daily with excellent control .  She has no proteinuria  And is taking an ARB. She is not taking a statin   Lab Results  Component Value Date   HGBA1C 6.0 08/24/2022   Lab Results  Component Value Date   MICROALBUR <0.7 09/16/2022   MICROALBUR 1.1 06/05/2021            Follow-up: No follow-ups on file.   Brenda Shams, MD

## 2022-09-16 NOTE — Patient Instructions (Signed)
Please take your stool samples to the [facility] LOCATED IN THE [facility]ICAL MALL

## 2022-09-23 ENCOUNTER — Other Ambulatory Visit
Admission: RE | Admit: 2022-09-23 | Discharge: 2022-09-23 | Disposition: A | Discharge status: Home / Self Care | Payer: Medicare Other | Source: Ambulatory Visit | Attending: Internal Medicine | Admitting: Internal Medicine

## 2022-09-23 DIAGNOSIS — B839 Helminthiasis, unspecified: Secondary | ICD-10-CM | POA: Insufficient documentation

## 2022-09-23 DIAGNOSIS — D509 Iron deficiency anemia, unspecified: Secondary | ICD-10-CM

## 2022-09-23 LAB — OCCULT BLOOD X 1 CARD TO LAB, STOOL: Fecal Occult Bld: NEGATIVE

## 2022-09-26 LAB — GI PATHOGEN PANEL BY PCR, STOOL

## 2022-09-26 LAB — OVA + PARASITE EXAM

## 2022-09-26 LAB — O&P RESULT

## 2022-09-27 ENCOUNTER — Encounter: Payer: Self-pay | Admitting: Internal Medicine

## 2022-10-07 NOTE — Assessment & Plan Note (Signed)
SHE HAS HAD SEVERAL LOOSE STOOLS IN THE SETTING OF EXPOSURE TO FARM ANIMALS AND RAW EGGS.  Gi PATHOGEN PANEL ORDERED

## 2022-11-06 ENCOUNTER — Other Ambulatory Visit: Payer: Self-pay | Admitting: Internal Medicine

## 2022-12-02 ENCOUNTER — Ambulatory Visit: Payer: Medicare Other | Admitting: Emergency Medicine

## 2022-12-02 VITALS — Ht 64.0 in | Wt 267.0 lb

## 2022-12-02 DIAGNOSIS — Z0000 Encounter for general adult medical examination without abnormal findings: Secondary | ICD-10-CM | POA: Diagnosis not present

## 2022-12-02 NOTE — Patient Instructions (Addendum)
Brenda Floyd , Thank you for taking time to come for your Medicare Wellness Visit. I appreciate your ongoing commitment to your health goals. Please review the following plan we discussed and let me know if I can assist you in the future.   Referrals/Orders/Follow-Ups/Clinician Recommendations: Schedule an appointment with Dr. Brooke Dare for a diabetic eye exam at your earliest convenience. It is recommended that you get this exam yearly. Discuss with Dr. Darrick Huntsman the need for a bone density scan at your next OV.   This is a list of the screening recommended for you and due dates:  Health Maintenance  Topic Date Due   Eye exam for diabetics  01/15/2018   Mammogram  11/23/2018   DTaP/Tdap/Td vaccine (3 - Td or Tdap) 11/08/2021   Flu Shot  05/10/2023*   Hemoglobin A1C  02/24/2023   Yearly kidney function blood test for diabetes  08/24/2023   Complete foot exam   08/24/2023   Yearly kidney health urinalysis for diabetes  09/16/2023   Medicare Annual Wellness Visit  12/02/2023   Pneumonia Vaccine  Completed   DEXA scan (bone density measurement)  Completed   HPV Vaccine  Aged Out   COVID-19 Vaccine  Discontinued   Zoster (Shingles) Vaccine  Discontinued  *Topic was postponed. The date shown is not the original due date.    Advanced directives: (ACP Link)Information on Advanced Care Planning can be found at [facility] Advance Health Care Directives Advance Health Care Directives ([email or web address]   Please bring a copy of your health care power of attorney and living will to the office to be added to your chart at your convenience.   Next Medicare Annual Wellness Visit scheduled for next year: Yes, 12/07/23 @ 2:15pm  Exercise Information for Aging Adults Staying physically active is important as you age. Physical activity and exercise can help in maintaining quality of life, health, physical function, and reducing falls. The four types of exercises that are best for older adults are  endurance, strength, balance, and flexibility. Contact your health care provider before you start any exercise routine. Ask your health care provider what activities are safe for you. What are the risks? Risks associated with exercising include: Overdoing it. This may lead to sore muscles or fatigue. Falls. Injuries. Dehydration. How to do these exercises Endurance exercises Endurance (aerobic) exercises raise your breathing rate and heart rate. Increasing your endurance helps you do everyday tasks and stay healthy. By improving the health of your body system that includes your heart, lungs, and blood vessels (circulatory system), you may also delay or prevent diseases such as heart disease, diabetes, and weak bones (osteoporosis). Types of endurance exercises include: Sports. Indoor activities, such as using gym equipment, doing water aerobics, or dancing. Outdoor activities, such as biking or jogging. Tasks around the house, such as gardening, yard work, and heavy household chores like cleaning. Walking, such as hiking or walking around your neighborhood. When doing endurance exercises, make sure you: Are aware of your surroundings. Use safety equipment as directed. Dress in layers when exercising outdoors. Drink plenty of water to stay well hydrated. Build up endurance slowly. Start with 10 minutes at a time, and gradually build up to doing 30 minutes at a time. Unless your health care provider gave you different instructions, aim to exercise for a total of 150 minutes a week. Spread out that time so you are working on endurance 3 or more days a week. Strength exercises Lifting, pulling, or pushing weights  helps to strengthen muscles. Having stronger muscles makes it easier to do everyday activities, such as getting up from a chair, climbing stairs, carrying groceries, and playing with grandchildren. Strength exercises include arm and leg exercises that may be done: With weights. Without  weights (using your own body weight). With a resistance band. When doing strength exercises: Move smoothly and steadily. Do not suddenly thrust or jerk the weights, the resistance band, or your body. Start with no weights or with light weights, and gradually add more weight over time. Eventually, aim to use weights that are hard or very hard for you to lift. This means that you are able to do 8 repetitions with the weight, and the last few repetitions are very challenging. Lift or push weights into position for 3 seconds, hold the position for 1 second, and then take 3 seconds to return to your starting position. Breathe out (exhale) during difficult movements, like lifting or pushing weights. Breathe in (inhale) to relax your muscles before the next repetition. Consider alternating arms or legs, especially when you first start strength exercises. Expect some slight muscle soreness after each session. Do strength exercises on 2 or more days a week, for 30 minutes at a time. Avoid exercising the same muscle groups two days in a row. For example, if you work on your leg muscles one day, work on your arm muscles the next day. When you can do two sets of 10-15 repetitions with a certain weight, increase the amount of weight. Balance exercises Balance exercises can help to prevent falls. Balance exercises include: Standing on one foot. Heel-to-toe walk. Balance walk. Tai chi. Make sure you have something sturdy to hold onto while doing balance exercises, such as a sturdy chair. As your balance improves, challenge yourself by holding on to the chair with one hand instead of two, and then with no hands. Trying exercises with your eyes closed also challenges your balance, but be sure to have a sturdy surface (like a countertop) close by in case you need it. Do balance exercises as often as you want, or as often as directed by your health care provider. Flexibility exercises  Flexibility exercises improve  how far you can bend, straighten, move, or rotate parts of your body (range of motion). These exercises also help you do everyday activities such as getting dressed or reaching for objects. Flexibility exercises include stretching different parts of the body, and they may be done in a standing or seated position or on the floor. When stretching, make sure you: Keep a slight bend in your arms and legs. Avoid completely straightening ("locking") your joints. Do not stretch so far that you feel pain. You should feel a mild stretching feeling. You may try stretching farther as you become more flexible over time. Relax and breathe between stretches. Hold on to something sturdy for balance as needed. Hold each stretch for 10-30 seconds. Repeat each stretch 3-5 times. General safety tips Exercise in well-lit areas. Do not hold your breath during exercises or stretches. Warm up before exercising, and cool down after exercising. This can help prevent injury. Drink plenty of water during exercise or any activity that makes you sweat. If you are not sure if an exercise is safe for you, or you are not sure how to do an exercise, talk with your health care provider. This is especially important if you have had surgery on muscles, bones, or joints (orthopedic surgery). Where to find more information You can find more  information about exercise for older adults from: Your local health department, fitness center, or community center. These facilities may have programs for aging adults. [employer] on Aging: https://walker.com/ National Council on Aging: www.ncoa.org Summary Staying physically active is important as you age. Doing endurance, strength, balance, and flexibility exercises can help in maintaining quality of life, health, physical function, and reducing falls. Make sure to contact your health care provider before you start any exercise routine. Ask your health care provider what activities are  safe for you. This information is not intended to replace advice given to you by your health care provider. Make sure you discuss any questions you have with your health care provider. Document Revised: 06/10/2020 Document Reviewed: 06/10/2020 Elsevier Patient Education  2024 [employer].

## 2022-12-02 NOTE — Progress Notes (Signed)
Subjective:   Brenda Floyd is a 82 y.o. female who presents for Medicare Annual (Subsequent) preventive examination.  Visit Complete: Virtual I connected with  Brenda Floyd on 12/02/22 by a audio enabled telemedicine application and verified that I am speaking with the correct person using two identifiers.  Patient Location: Home  Provider Location: Home Office  I discussed the limitations of evaluation and management by telemedicine. The patient expressed understanding and agreed to proceed.  Vital Signs: Because this visit was a virtual/telehealth visit, some criteria may be missing or patient reported. Any vitals not documented were not able to be obtained and vitals that have been documented are patient reported.  Patient Medicare AWV questionnaire was completed by the patient on 12/01/22; I have confirmed that all information answered by patient is correct and no changes since this date.  Cardiac Risk Factors include: advanced age (>26men, >30 women);hypertension;dyslipidemia;diabetes mellitus;obesity (BMI >30kg/m2);Other (see comment), Risk factor comments: OSA uses cpap     Objective:    Today's Vitals   12/02/22 1315  Weight: 267 lb (121.1 kg)  Height: 5\' 4"  (1.626 m)   Body mass index is 45.83 kg/m.     12/02/2022    1:26 PM 11/28/2021    3:38 PM 11/13/2020    1:30 PM 11/13/2019    1:39 PM 08/19/2018   11:47 AM 08/16/2017   12:09 PM 05/04/2017    7:48 AM  Advanced Directives  Does Patient Have a Medical Advance Directive? No No Yes No Yes Yes No  Type of Cytogeneticist of [place];Living will Healthcare Power of Attorney   Does patient want to make changes to medical advance directive?   No - Patient declined   No - Patient declined   Copy of Healthcare Power of Attorney in Chart?   No - copy requested  No - copy requested No - copy requested   Would patient like information on creating a medical advance  directive? Yes (MAU/Ambulatory/Procedural Areas - Information given) No - Patient declined  No - Patient declined   No - Patient declined    Current Medications (verified) Outpatient Encounter Medications as of 12/02/2022  Medication Sig   aspirin 81 MG tablet Take 1 tablet (81 mg total) by mouth daily.   Barberry-Oreg Grape-Goldenseal (BERBERINE COMPLEX PO) Take 1 capsule by mouth 3 (three) times daily before meals.   Cholecalciferol 125 MCG (5000 UT) capsule Take 5,000 Units by mouth daily.   Continuous Glucose Sensor (FREESTYLE LIBRE 3 SENSOR) MISC Use to check blood sugars continuously   Cyanocobalamin (B-12) 5000 MCG CAPS Take 1 capsule by mouth daily.   fexofenadine (ALLEGRA) 180 MG tablet Take 180 mg by mouth daily.   Iron, Ferrous Sulfate, 325 (65 Fe) MG TABS Take 325 mg by mouth daily.   LANTUS SOLOSTAR 100 UNIT/ML Solostar Pen INJECT 25 UNITS UNDER THE SKIN DAILY   levothyroxine (SYNTHROID) 88 MCG tablet TAKE 1 TABLET DAILY FOR HYPOTHYROIDISM   magnesium gluconate (MAGONATE) 500 MG tablet Take 500 mg by mouth 2 (two) times daily.   PARoxetine (PAXIL) 20 MG tablet TAKE 1 TABLET DAILY   telmisartan (MICARDIS) 40 MG tablet TAKE 1 TABLET AT BEDTIME   TURMERIC PO Take by mouth daily.   triamcinolone ointment (KENALOG) 0.5 % Apply 1 Application topically 2 (two) times daily.   No facility-administered encounter medications on file as of 12/02/2022.    Allergies (verified) Crestor [rosuvastatin]   History: Past  Medical History:  Diagnosis Date   Arthritis    neck, knees, left shoulder   Cancer (HCC)    skin ca   Colon polyps 2012   [facility], followup due 2015   Diabetes mellitus    Diverticulosis of sigmoid colon July 2012   by colonoscopy   Dyspnea    Fracture closed, femur, shaft (HCC) remote   s/p screws and rod repair   Hypertension    Thyroid disease    Past Surgical History:  Procedure Laterality Date   ABDOMINAL HYSTERECTOMY     BREAST CYST ASPIRATION Left     neg   CATARACT EXTRACTION W/PHACO Right 04/06/2017   Procedure: CATARACT EXTRACTION PHACO AND INTRAOCULAR LENS PLACEMENT (IOC) RIGHT DIABETIC;  Surgeon: Nevada Crane, MD;  Location: [facility];  Service: Ophthalmology;  Laterality: Right;   CATARACT EXTRACTION W/PHACO Left 05/04/2017   Procedure: CATARACT EXTRACTION PHACO AND INTRAOCULAR LENS PLACEMENT (IOC) LEFT DIABETIC;  Surgeon: Nevada Crane, MD;  Location: [facility];  Service: Ophthalmology;  Laterality: Left;  DIABETIC   JOINT REPLACEMENT     bilateral knee, 2011   Family History  Problem Relation Age of Onset   Cancer Mother        breast cancer and colon ca   Breast cancer Mother 54   Cancer Father        leukemia   Diabetes Sister    Hypertension Sister    Breast cancer Sister 54   Breast cancer Maternal Aunt    Breast cancer Cousin        maternal 1st cousin   Social History   Socioeconomic History   Marital status: Single    Spouse name: Not on file   Number of children: 0   Years of education: Not on file   Highest education level: Master's degree (e.g., MA, MS, MEng, MEd, MSW, MBA)  Occupational History   Occupation: retired  Tobacco Use   Smoking status: Former    Current packs/day: 0.00    Average packs/day: 0.8 packs/day for 35.7 years (26.8 ttl pk-yrs)    Types: Cigarettes    Start date: 51    Quit date: 10/30/1990    Years since quitting: 32.1   Smokeless tobacco: Never  Vaping Use   Vaping status: Never Used  Substance and Sexual Activity   Alcohol use: No   Drug use: No   Sexual activity: Never  Other Topics Concern   Not on file  Social History Narrative   Never married, no children   Social Determinants of Health   Financial Resource Strain: Low Risk  (12/01/2022)   Overall Financial Resource Strain (CARDIA)    Difficulty of Paying Living Expenses: Not hard at all  Food Insecurity: No Food Insecurity (12/01/2022)   Hunger Vital Sign    Worried About Running  Out of Food in the Last Year: Never true    Ran Out of Food in the Last Year: Never true  Transportation Needs: No Transportation Needs (12/01/2022)   PRAPARE - Administrator, Civil Service (Medical): No    Lack of Transportation (Non-Medical): No  Physical Activity: Inactive (12/01/2022)   Exercise Vital Sign    Days of Exercise per Week: 0 days    Minutes of Exercise per Session: 0 min  Stress: No Stress Concern Present (12/01/2022)   [employer] of Occupational Health - Occupational Stress Questionnaire    Feeling of Stress : Only a little  Social Connections: Socially  Isolated (12/01/2022)   Social Connection and Isolation Panel [NHANES]    Frequency of Communication with Friends and Family: More than three times a week    Frequency of Social Gatherings with Friends and Family: More than three times a week    Attends Religious Services: Never    Database administrator or Organizations: No    Attends Engineer, structural: Never    Marital Status: Never married    Tobacco Counseling Counseling given: Not Answered   Clinical Intake:  Pre-visit preparation completed: Yes  Pain : No/denies pain     BMI - recorded: 45.83 Nutritional Status: BMI > 30  Obese Nutritional Risks: None Diabetes: Yes CBG done?: No (FBS 105 per patient) Did pt. bring in CBG monitor from home?: No  How often do you need to have someone help you when you read instructions, pamphlets, or other written materials from your doctor or pharmacy?: 1 - Never  Interpreter Needed?: No  Information entered by :: Tora Kindred, CMA   Activities of Daily Living    12/01/2022    2:46 PM  In your present state of health, do you have any difficulty performing the following activities:  Hearing? 0  Vision? 0  Difficulty concentrating or making decisions? 0  Walking or climbing stairs? 0  Dressing or bathing? 0  Doing errands, shopping? 0  Preparing Food and eating ? N  Using  the Toilet? N  In the past six months, have you accidently leaked urine? Y  Comment uses pads  Do you have problems with loss of bowel control? N  Managing your Medications? N  Managing your Finances? N  Housekeeping or managing your Housekeeping? N    Patient Care Team: Sherlene Shams, MD as PCP - General (Internal Medicine)  Indicate any recent Medical Services you may have received from other than [facility] providers in the past year (date may be approximate).     Assessment:   This is a routine wellness examination for [facility]  Hearing/Vision screen Hearing Screening - Comments:: Denies hearing loss Vision Screening - Comments:: Needs eye exam   Goals Addressed               This Visit's Progress     Exercise 3x per week (30 min per time) (pt-stated)        Depression Screen    12/02/2022    1:24 PM 09/16/2022   10:49 AM 08/24/2022    4:08 PM 07/03/2022    1:14 PM 03/20/2022    3:40 PM 12/05/2021    1:34 PM 11/28/2021    3:37 PM  PHQ 2/9 Scores  PHQ - 2 Score 0 0 0 0 0 0 0  PHQ- 9 Score 0          Fall Risk    12/01/2022    2:46 PM 09/16/2022   10:49 AM 08/24/2022    4:08 PM 07/03/2022    1:13 PM 03/20/2022    3:40 PM  Fall Risk   Falls in the past year? 0 0 0 0 0  Number falls in past yr: 0 0 0 0 0  Injury with Fall? 0 0 0 0 0  Risk for fall due to : No Fall Risks No Fall Risks No Fall Risks  No Fall Risks  Follow up Falls prevention discussed Falls evaluation completed Falls evaluation completed Falls evaluation completed;Education provided;Falls prevention discussed Falls evaluation completed    MEDICARE RISK AT HOME: Medicare Risk at  Home Any stairs in or around the home?: Yes If so, are there any without handrails?: No Home free of loose throw rugs in walkways, pet beds, electrical cords, etc?: Yes Adequate lighting in your home to reduce risk of falls?: Yes Life alert?: No Use of a cane, walker or w/c?: No Grab bars in the bathroom?: No Shower chair  or bench in shower?: Yes Elevated toilet seat or a handicapped toilet?: No  TIMED UP AND GO:  Was the test performed?  No    Cognitive Function:    08/16/2017   12:13 PM  MMSE - Mini Mental State Exam  Orientation to time 5  Orientation to Place 5  Registration 3  Attention/ Calculation 5  Recall 1  Language- name 2 objects 2  Language- repeat 1  Language- follow 3 step command 3  Language- read & follow direction 1  Write a sentence 1  Copy design 1  Total score 28        12/02/2022    1:27 PM 11/28/2021    3:39 PM 08/19/2018   11:44 AM 01/14/2016    4:35 PM  6CIT Screen  What Year? 0 points 0 points 0 points 0 points  What month? 0 points 0 points 0 points 0 points  What time? 0 points 0 points 0 points 0 points  Count back from 20 0 points 0 points 0 points 0 points  Months in reverse 0 points 0 points 0 points 0 points  Repeat phrase 0 points 0 points 0 points 0 points  Total Score 0 points 0 points 0 points 0 points    Immunizations Immunization History  Administered Date(s) Administered   Influenza Split 09/29/2010, 11/09/2011   Influenza,inj,Quad PF,6+ Mos 11/08/2012   Pneumococcal Conjugate-13 02/23/2013   Pneumococcal Polysaccharide-23 11/09/2003, 12/21/2016   Tdap 04/15/2011   Tetanus 11/09/2011    TDAP status: Due, Education has been provided regarding the importance of this vaccine. Advised may receive this vaccine at local pharmacy or Health Dept. Aware to provide a copy of the vaccination record if obtained from local pharmacy or Health Dept. Verbalized acceptance and understanding. Patient declined  Flu Vaccine status: Declined, Education has been provided regarding the importance of this vaccine but patient still declined. Advised may receive this vaccine at local pharmacy or Health Dept. Aware to provide a copy of the vaccination record if obtained from local pharmacy or Health Dept. Verbalized acceptance and understanding.  Pneumococcal vaccine  status: Up to date  Covid-19 vaccine status: Declined, Education has been provided regarding the importance of this vaccine but patient still declined. Advised may receive this vaccine at local pharmacy or Health Dept.or vaccine clinic. Aware to provide a copy of the vaccination record if obtained from local pharmacy or Health Dept. Verbalized acceptance and understanding.  Qualifies for Shingles Vaccine? Yes   Zostavax completed No   Shingrix Completed?: No.    Education has been provided regarding the importance of this vaccine. Patient has been advised to call insurance company to determine out of pocket expense if they have not yet received this vaccine. Advised may also receive vaccine at local pharmacy or Health Dept. Verbalized acceptance and understanding.  Screening Tests Health Maintenance  Topic Date Due   OPHTHALMOLOGY EXAM  01/15/2018   MAMMOGRAM  11/23/2018   DTaP/Tdap/Td (3 - Td or Tdap) 11/08/2021   INFLUENZA VACCINE  05/10/2023 (Originally 09/10/2022)   HEMOGLOBIN A1C  02/24/2023   Diabetic kidney evaluation - eGFR measurement  08/24/2023  FOOT EXAM  08/24/2023   Diabetic kidney evaluation - Urine ACR  09/16/2023   Medicare Annual Wellness (AWV)  12/02/2023   Pneumonia Vaccine 66+ Years old  Completed   DEXA SCAN  Completed   HPV VACCINES  Aged Out   COVID-19 Vaccine  Discontinued   Zoster Vaccines- Shingrix  Discontinued    Health Maintenance  Health Maintenance Due  Topic Date Due   OPHTHALMOLOGY EXAM  01/15/2018   MAMMOGRAM  11/23/2018   DTaP/Tdap/Td (3 - Td or Tdap) 11/08/2021    Colorectal cancer screening: No longer required.   Mammogram status: No longer required due to age.  Bone Density status: Completed 08/19/14. Results reflect: Bone density results: OSTEOPENIA. Repeat every 2 years.  Lung Cancer Screening: (Low Dose CT Chest recommended if Age 27-80 years, 20 pack-year currently smoking OR have quit w/in 15years.) does not qualify.   Lung Cancer  Screening Referral: n/a  Additional Screening:  Hepatitis C Screening: does not qualify;   Vision Screening: Recommended annual ophthalmology exams for early detection of glaucoma and other disorders of the eye. Is the patient up to date with their annual eye exam?  No  Who is the provider or what is the name of the office in which the patient attends annual eye exams? Dr. Brooke Dare @ [facility] If pt is not established with a provider, would they like to be referred to a provider to establish care? No .   Dental Screening: Recommended annual dental exams for proper oral hygiene  Diabetic Foot Exam: Diabetic Foot Exam: Completed 08/24/22  Community Resource Referral / Chronic Care Management: CRR required this visit?  No   CCM required this visit?  No     Plan:     I have personally reviewed and noted the following in the patient's chart:   Medical and social history Use of alcohol, tobacco or illicit drugs  Current medications and supplements including opioid prescriptions. Patient is not currently taking opioid prescriptions. Functional ability and status Nutritional status Physical activity Advanced directives List of other physicians Hospitalizations, surgeries, and ER visits in previous 12 months Vitals Screenings to include cognitive, depression, and falls Referrals and appointments  In addition, I have reviewed and discussed with patient certain preventive protocols, quality metrics, and best practice recommendations. A written personalized care plan for preventive services as well as general preventive health recommendations were provided to patient.     Tora Kindred, CMA   12/02/2022   After Visit Summary: (MyChart) Due to this being a telephonic visit, the after visit summary with patients personalized plan was offered to patient via MyChart   Nurse Notes:  Declined DM & Nutrition education. Needs DM eye exam. Patient declined referral states she will set up  appointment herself. Will discuss the need for a DEXA scan with Dr. Darrick Huntsman at next OV. Declined Tdap, flu, covid and shingles vaccines.

## 2022-12-04 ENCOUNTER — Telehealth: Payer: Self-pay | Admitting: Internal Medicine

## 2022-12-04 NOTE — Telephone Encounter (Signed)
[facility] just called and said they sent a refill request for patient Continuous Glucose Sensor (FREESTYLE LIBRE 3 SENSOR) MISC on parashoot. She said it was sent on 11-10-22 and has not heard anything about it. Their number is [phone].

## 2022-12-07 NOTE — Telephone Encounter (Signed)
Can you see if this came through [facility]. I tried to sign up and it said that my email has expired.

## 2022-12-11 NOTE — Telephone Encounter (Signed)
Form has been completed and placed in quick sign folder for signature.  

## 2022-12-14 NOTE — Telephone Encounter (Signed)
Most recent office notes faxed to [facility]

## 2022-12-14 NOTE — Telephone Encounter (Signed)
[facility] needs notes stating about patient's sugar dropping below 54. Please fax notes to [phone]. If any questions you can call Thayer Ohm, [phone].

## 2022-12-17 NOTE — Telephone Encounter (Signed)
[facility] just called again and said they need notes stating about patient's sugar is dropping below 54. He said they have not received the office notes yet. Fax number is [phone].

## 2022-12-18 NOTE — Telephone Encounter (Signed)
The order has been corrected and faxed back.

## 2022-12-21 NOTE — Telephone Encounter (Signed)
[facility] called they said they received everything. The only thing they need is proof that the patient is still taking insulin. They said on the office notes they received it said the insulin she is on expired in July. They just need proof she is still on insulin.

## 2022-12-24 NOTE — Telephone Encounter (Signed)
Most recent note will need to be amended stating that pt is still currently taking insulin.

## 2022-12-24 NOTE — Assessment & Plan Note (Addendum)
She is taking insulin.  She has resumed lantus 26 units daily with excellent control .  She has no proteinuria  And is taking an ARB. She is not taking a statin   Lab Results  Component Value Date   HGBA1C 6.0 08/24/2022   Lab Results  Component Value Date   MICROALBUR <0.7 09/16/2022   MICROALBUR 1.1 06/05/2021

## 2022-12-28 NOTE — Telephone Encounter (Signed)
 Office note has been faxed.

## 2022-12-31 ENCOUNTER — Telehealth: Payer: Self-pay

## 2022-12-31 NOTE — Telephone Encounter (Signed)
Patient states she saw the letter I sent to her via MyChart and called Thayer Ohm at [facility].  Patient states we do have her correct phone number on file, but she does not always have her cell phone on.  Patient states Thayer Ohm needs verification that she is still using this medication.  I let her know that I did send the message from [facility] to Dr. Brennan Bailey nurse and it did include the request for information that he needed.

## 2022-12-31 NOTE — Telephone Encounter (Signed)
I sent a letter to patient via MyChart to let her know Brenda Floyd from [facility] is trying to reach her.  I also asked her to please let us know if her phone number has changed.

## 2022-12-31 NOTE — Telephone Encounter (Signed)
noted 

## 2022-12-31 NOTE — Telephone Encounter (Signed)
Thayer Ohm is calling from [employer] to state they provide [employer] for patient.  Thayer Ohm states they need an updated medication list to show the LANTUS SOLOSTAR 100 UNIT/ML Solostar Pen does not have an end date or is at least still being used by patient.  Thayer Ohm states his last office note was from 08/24/2022 so he would like to have office notes for any visit patient has had with [place] since that time.  Thayer Ohm states he has been unable to reach patient at the number they have for her.  He asked if I can verify patient's phone number.  I let him know that I cannot do that, but I can call her so she can call him.  I called the phone number we have on file for patient but received a message stating my call cannot be completed at this time.  Please try your call again later.

## 2022-12-31 NOTE — Telephone Encounter (Signed)
Spoke with a gentleman at [employer] and we were able to figure out how to upload the face to face information into [employer] and fax it to them.

## 2023-01-04 ENCOUNTER — Telehealth: Payer: Self-pay | Admitting: Internal Medicine

## 2023-01-04 NOTE — Telephone Encounter (Signed)
Signed copy has been faxed.

## 2023-01-04 NOTE — Telephone Encounter (Signed)
[facility] needs a signature for prescription Continuous Glucose Sensor (FREESTYLE LIBRE 3 SENSOR) MISC. It is waiting in Parachute to be signed.

## 2023-01-23 ENCOUNTER — Other Ambulatory Visit: Payer: Self-pay | Admitting: Internal Medicine

## 2023-02-01 DIAGNOSIS — Z888 Allergy status to other drugs, medicaments and biological substances status: Secondary | ICD-10-CM | POA: Diagnosis not present

## 2023-02-01 DIAGNOSIS — S0003XA Contusion of scalp, initial encounter: Secondary | ICD-10-CM | POA: Diagnosis not present

## 2023-02-24 ENCOUNTER — Encounter: Payer: Self-pay | Admitting: Internal Medicine

## 2023-02-24 ENCOUNTER — Ambulatory Visit (INDEPENDENT_AMBULATORY_CARE_PROVIDER_SITE_OTHER): Payer: Medicare Other | Admitting: Internal Medicine

## 2023-02-24 VITALS — BP 124/76 | HR 91 | Temp 98.0°F | Ht 64.0 in | Wt 257.8 lb

## 2023-02-24 DIAGNOSIS — I10 Essential (primary) hypertension: Secondary | ICD-10-CM | POA: Diagnosis not present

## 2023-02-24 DIAGNOSIS — E034 Atrophy of thyroid (acquired): Secondary | ICD-10-CM

## 2023-02-24 DIAGNOSIS — S098XXD Other specified injuries of head, subsequent encounter: Secondary | ICD-10-CM | POA: Diagnosis not present

## 2023-02-24 DIAGNOSIS — Z794 Long term (current) use of insulin: Secondary | ICD-10-CM

## 2023-02-24 DIAGNOSIS — E538 Deficiency of other specified B group vitamins: Secondary | ICD-10-CM | POA: Diagnosis not present

## 2023-02-24 DIAGNOSIS — D509 Iron deficiency anemia, unspecified: Secondary | ICD-10-CM | POA: Diagnosis not present

## 2023-02-24 DIAGNOSIS — E785 Hyperlipidemia, unspecified: Secondary | ICD-10-CM | POA: Diagnosis not present

## 2023-02-24 DIAGNOSIS — D126 Benign neoplasm of colon, unspecified: Secondary | ICD-10-CM | POA: Diagnosis not present

## 2023-02-24 DIAGNOSIS — M25572 Pain in left ankle and joints of left foot: Secondary | ICD-10-CM | POA: Diagnosis not present

## 2023-02-24 DIAGNOSIS — B839 Helminthiasis, unspecified: Secondary | ICD-10-CM

## 2023-02-24 DIAGNOSIS — E119 Type 2 diabetes mellitus without complications: Secondary | ICD-10-CM

## 2023-02-24 DIAGNOSIS — S098XXA Other specified injuries of head, initial encounter: Secondary | ICD-10-CM

## 2023-02-24 HISTORY — DX: Other specified injuries of head, initial encounter: S09.8XXA

## 2023-02-24 MED ORDER — IRON (FERROUS SULFATE) 325 (65 FE) MG PO TABS: 325.0000 mg | ORAL_TABLET | Freq: Every day | ORAL | 1 refills | Status: DC

## 2023-02-24 MED ORDER — LANTUS SOLOSTAR 100 UNIT/ML ~~LOC~~ SOPN: PEN_INJECTOR | SUBCUTANEOUS | 5 refills | Status: DC

## 2023-02-24 NOTE — Assessment & Plan Note (Addendum)
Recurrent with last evaluation 2023 by [facility] which included EGD and labs to rule out PA.  Current Source unclear.  She did not return for FOBT testing and stopped iron supplement on Dec 28.  Has not had follow up studies since July . Has  not seen [facility] ([facility] ) since 2023.  Repeat referral in progress  Lab Results  Component Value Date   WBC 4.6 08/24/2022   HGB 8.2 Repeated and verified X2. (L) 08/24/2022   HCT 26.2 Repeated and verified X2. (L) 08/24/2022   MCV 92.8 08/24/2022   PLT 242.0 08/24/2022   Lab Results  Component Value Date   IRON 38 (L) 08/24/2022   TIBC 490.0 (H) 08/24/2022   FERRITIN 7.2 (L) 08/24/2022

## 2023-02-24 NOTE — Patient Instructions (Signed)
 Referrals to [facility] and [facility] are in progress

## 2023-02-24 NOTE — Progress Notes (Signed)
Subjective:  Brenda Floyd ID: Brenda Floyd, female    DOB: 08-22-1940  Age: 83 y.o. MRN: [id number]  CC: The primary encounter diagnosis was Essential hypertension. Diagnoses of Type 2 diabetes mellitus with insulin therapy (HCC), Hypothyroidism due to acquired atrophy of thyroid, Hyperlipidemia with target LDL less than 70, Blunt head trauma, subsequent encounter, Iron deficiency anemia, unspecified iron deficiency anemia type, Acute left ankle pain, Tubular adenoma of colon, Worms in stool, Vitamin B12 deficiency (non anemic), and Pain of joint of left ankle and foot were also pertinent to this visit.   HPI [name] presents for  Chief Complaint  Brenda Floyd presents with   Medical Management of Chronic Issues    Brenda Floyd is N 83 YR OLD retired [employer] fund[employer] with a history of IDA and tubular adenoma (last colonoscpy 2017, last EGD 2023) who presents for follow up on multiple issues.  Last seen August 2024  Recent fall  onto her left side on Dec 23 .  Went to [facility] ER .  Head CT done .  Parietal hematoma and small laceration of scalp ,  treated with glue.  .  Left shoulder painful  constantly and ROM is limited to raising to the horizontal, but Right shoulder also problematic. Marland Kitchen  Using insta flex otc    since the all her Left ankle has felt unstable.  It was not imaged,  but  became bruised on both sides and swelled .  She is  losing balance if she turns quickly.  No pain going up and down stairs.    Type 2 DM:  I have downloaded and reviewed the data from Brenda Floyd's continuous blood glucose monitor. Brenda Floyd's  sugars have been  IN RANGE  99   % OF THE TIME,   BELOW RANGE  1  % of the time.  And ABOVE RANGE 0% OF THE TIME .  Medication changes were made based on this review as follows: none.  She is taking 25 units   of lantus at night  has had some lows at 3 am   no bedtime snacks due to rolling over on stomach where she places her sensor   IDA:  her hgb was quite low in July , and iron  was started.  She did not return for follow up labs and did not complete the FOBT that was requested..  she stopped her iron supplement on Dec 28 for unclear reasons (not intolerance)   Outpatient Medications Prior to Visit  Medication Sig Dispense Refill   aspirin 81 MG tablet Take 1 tablet (81 mg total) by mouth daily. 30 tablet 11   Barberry-Oreg Grape-Goldenseal (BERBERINE COMPLEX PO) Take 1 capsule by mouth 3 (three) times daily before meals.     Cholecalciferol 125 MCG (5000 UT) capsule Take 5,000 Units by mouth daily.     Continuous Glucose Sensor (FREESTYLE LIBRE 3 SENSOR) MISC Use to check blood sugars continuously 6 each 3   Cyanocobalamin (B-12) 5000 MCG CAPS Take 1 capsule by mouth daily.     fexofenadine (ALLEGRA) 180 MG tablet Take 180 mg by mouth daily.     levothyroxine (SYNTHROID) 88 MCG tablet TAKE 1 TABLET DAILY FOR HYPOTHYROIDISM 90 tablet 3   magnesium gluconate (MAGONATE) 500 MG tablet Take 500 mg by mouth 2 (two) times daily.     PARoxetine (PAXIL) 20 MG tablet TAKE 1 TABLET DAILY 90 tablet 3   telmisartan (MICARDIS) 40 MG tablet TAKE 1 TABLET AT BEDTIME 90 tablet 3  TURMERIC PO Take by mouth daily.     Iron, Ferrous Sulfate, 325 (65 Fe) MG TABS Take 325 mg by mouth daily. (Brenda Floyd not taking: Reported on 02/24/2023) 90 tablet 1   LANTUS SOLOSTAR 100 UNIT/ML Solostar Pen INJECT 25 UNITS UNDER THE SKIN DAILY 15 mL 5   No facility-administered medications prior to visit.    Review of Systems;  Brenda Floyd denies headache, fevers, malaise, unintentional weight loss, skin rash, eye pain, sinus congestion and sinus pain, sore throat, dysphagia,  hemoptysis , cough, dyspnea, wheezing, chest pain, palpitations, orthopnea, edema, abdominal pain, nausea, melena, diarrhea, constipation, flank pain, dysuria, hematuria, urinary  Frequency, nocturia, numbness, tingling, seizures,  Focal weakness, Loss of consciousness,  Tremor, insomnia, depression, anxiety, and suicidal ideation.       Objective:  BP 124/76   Pulse 91   Temp 98 F (36.7 C) (Oral)   Ht 5\' 4"  (1.626 m)   Wt 257 lb 12.8 oz (116.9 kg)   SpO2 99%   BMI 44.25 kg/m   BP Readings from Last 3 Encounters:  02/24/23 124/76  09/16/22 132/86  08/24/22 124/68    Wt Readings from Last 3 Encounters:  02/24/23 257 lb 12.8 oz (116.9 kg)  12/02/22 267 lb (121.1 kg)  09/16/22 261 lb 6.4 oz (118.6 kg)    Physical Exam Vitals reviewed.  Constitutional:      General: She is not in acute distress.    Appearance: Normal appearance. She is normal weight. She is not ill-appearing, toxic-appearing or diaphoretic.  HENT:     Head: Normocephalic.  Eyes:     General: No scleral icterus.       Right eye: No discharge.        Left eye: No discharge.     Conjunctiva/sclera: Conjunctivae normal.  Cardiovascular:     Rate and Rhythm: Normal rate and regular rhythm.     Heart sounds: Normal heart sounds.  Pulmonary:     Effort: Pulmonary effort is normal. No respiratory distress.     Breath sounds: Normal breath sounds.  Musculoskeletal:        General: Normal range of motion.     Right lower leg: Edema present.  Feet:     Comments: Diffuse swelling of both ankles.  ROM normal  Skin:    General: Skin is warm and dry.  Neurological:     General: No focal deficit present.     Mental Status: She is alert and oriented to person, place, and time. Mental status is at baseline.  Psychiatric:        Mood and Affect: Mood normal.        Behavior: Behavior normal.        Thought Content: Thought content normal.        Judgment: Judgment normal.    Lab Results  Component Value Date   HGBA1C 6.0 08/24/2022   HGBA1C 6.6 (H) 12/05/2021   HGBA1C 9.0 (A) 06/07/2021    Lab Results  Component Value Date   CREATININE 0.97 08/24/2022   CREATININE 0.80 12/05/2021   CREATININE 0.81 06/05/2021    Lab Results  Component Value Date   WBC 4.6 08/24/2022   HGB 8.2 Repeated and verified X2. (L) 08/24/2022   HCT  26.2 Repeated and verified X2. (L) 08/24/2022   PLT 242.0 08/24/2022   GLUCOSE 103 (H) 08/24/2022   CHOL 223 (H) 08/24/2022   TRIG 291.0 (H) 08/24/2022   HDL 48.80 08/24/2022   LDLDIRECT 140.0 08/24/2022  LDLCALC 134 (H) 08/11/2019   ALT 17 08/24/2022   AST 16 08/24/2022   NA 139 08/24/2022   K 4.4 08/24/2022   CL 106 08/24/2022   CREATININE 0.97 08/24/2022   BUN 26 (H) 08/24/2022   CO2 25 08/24/2022   TSH 1.73 08/24/2022   HGBA1C 6.0 08/24/2022   MICROALBUR <0.7 09/16/2022    No results found.  Assessment & Plan:  .Essential hypertension -     Comprehensive metabolic panel  Type 2 diabetes mellitus with insulin therapy (HCC) Assessment & Plan: Excellent control with use of Lantus onl  continue 25 units daily. .  She has no proteinuria  And is taking an ARB. She is not taking a statin   Lab Results  Component Value Date   HGBA1C 6.0 08/24/2022   Lab Results  Component Value Date   MICROALBUR <0.7 09/16/2022   MICROALBUR 1.1 06/05/2021       Orders: -     Comprehensive metabolic panel -     Hemoglobin A1c  Hypothyroidism due to acquired atrophy of thyroid -     TSH  Hyperlipidemia with target LDL less than 70 -     Lipid panel -     LDL cholesterol, direct  Blunt head trauma, subsequent encounter Assessment & Plan: Sustained during a fall.     Iron deficiency anemia, unspecified iron deficiency anemia type Assessment & Plan: Recurrent with last evaluation 2023 by [facility] which included EGD and labs to rule out PA.  Current Source unclear.  She did not return for FOBT testing and stopped iron supplement on Dec 28.  Has not had follow up studies since July . Has  not seen Tendler ([facility] ) since 2023.  Repeat referral in progress  Lab Results  Component Value Date   WBC 4.6 08/24/2022   HGB 8.2 Repeated and verified X2. (L) 08/24/2022   HCT 26.2 Repeated and verified X2. (L) 08/24/2022   MCV 92.8 08/24/2022   PLT 242.0 08/24/2022   Lab Results   Component Value Date   IRON 38 (L) 08/24/2022   TIBC 490.0 (H) 08/24/2022   FERRITIN 7.2 (L) 08/24/2022     Orders: -     IBC + Ferritin -     CBC with Differential/Platelet -     Fecal occult blood, imunochemical; Future -     Ambulatory referral to Gastroenterology  Acute left ankle pain -     Ambulatory referral to Orthopedic Surgery  Tubular adenoma of colon -     Ambulatory referral to Gastroenterology  Worms in stool Assessment & Plan: Ova and parasite TESTS  were NEGATIVE    Vitamin B12 deficiency (non anemic) Assessment & Plan: Testing for PA by Dr Barbie Banner was negative in 2023 ;  she continues to take an oral supplement  Lab Results  Component Value Date   VITAMINB12 834 08/05/2021      Pain of joint of left ankle and foot Assessment & Plan: Aggravated by recent fall.  Referring to Dr Ernest Pine for left ankle instability    Other orders -     Iron (Ferrous Sulfate); Take 325 mg by mouth daily.  Dispense: 90 tablet; Refill: 1 -     Lantus SoloStar; INJECT 25 UNITS UNDER THE SKIN DAILY  Dispense: 15 mL; Refill: 5     I provided 30 minutes of face-to-face time during this encounter reviewing Brenda Floyd's last visit with me, Brenda Floyd's  most recent visit with gastroenterolog, orthopedics , recent  surgical and non surgical procedures, previous  labs and imaging studies, counseling on currently addressed issues,  and post visit ordering to diagnostics and therapeutics .   Follow-up: Return in about 3 months (around 05/25/2023).   Sherlene Shams, MD

## 2023-02-24 NOTE — Assessment & Plan Note (Signed)
 Sustained during a fall.

## 2023-02-25 ENCOUNTER — Encounter: Payer: Self-pay | Admitting: Internal Medicine

## 2023-02-25 LAB — LIPID PANEL
Cholesterol: 231 mg/dL — ABNORMAL HIGH (ref 0–200)
HDL: 54.2 mg/dL (ref >39.00)
LDL Cholesterol: 135 mg/dL — ABNORMAL HIGH (ref 0–99)
NonHDL: 176.97
Total CHOL/HDL Ratio: 4
Triglycerides: 208 mg/dL — ABNORMAL HIGH (ref 0.0–149.0)
VLDL: 41.6 mg/dL — ABNORMAL HIGH (ref 0.0–40.0)

## 2023-02-25 LAB — CBC WITH DIFFERENTIAL/PLATELET
Basophils Absolute: 0.1 10*3/uL (ref 0.0–0.1)
Basophils Relative: 1.3 % (ref 0.0–3.0)
Eosinophils Absolute: 0.3 10*3/uL (ref 0.0–0.7)
Eosinophils Relative: 6.3 % — ABNORMAL HIGH (ref 0.0–5.0)
HCT: 32.1 % — ABNORMAL LOW (ref 36.0–46.0)
Hemoglobin: 10.4 g/dL — ABNORMAL LOW (ref 12.0–15.0)
Lymphocytes Relative: 32 % (ref 12.0–46.0)
Lymphs Abs: 1.5 10*3/uL (ref 0.7–4.0)
MCHC: 32.4 g/dL (ref 30.0–36.0)
MCV: 107.3 fL — ABNORMAL HIGH (ref 78.0–100.0)
Monocytes Absolute: 0.3 10*3/uL (ref 0.1–1.0)
Monocytes Relative: 6.7 % (ref 3.0–12.0)
Neutro Abs: 2.5 10*3/uL (ref 1.4–7.7)
Neutrophils Relative %: 53.7 % (ref 43.0–77.0)
Platelets: 324 10*3/uL (ref 150.0–400.0)
RBC: 2.99 Mil/uL — ABNORMAL LOW (ref 3.87–5.11)
RDW: 17.6 % — ABNORMAL HIGH (ref 11.5–15.5)
WBC: 4.7 10*3/uL (ref 4.0–10.5)

## 2023-02-25 LAB — COMPREHENSIVE METABOLIC PANEL
ALT: 17 U/L (ref 0–35)
AST: 17 U/L (ref 0–37)
Albumin: 4.3 g/dL (ref 3.5–5.2)
Alkaline Phosphatase: 58 U/L (ref 39–117)
BUN: 20 mg/dL (ref 6–23)
CO2: 25 meq/L (ref 19–32)
Calcium: 9.3 mg/dL (ref 8.4–10.5)
Chloride: 104 meq/L (ref 96–112)
Creatinine, Ser: 0.77 mg/dL (ref 0.40–1.20)
GFR: 70.76 mL/min (ref >60.00)
Glucose, Bld: 82 mg/dL (ref 70–99)
Potassium: 4.6 meq/L (ref 3.5–5.1)
Sodium: 139 meq/L (ref 135–145)
Total Bilirubin: 0.3 mg/dL (ref 0.2–1.2)
Total Protein: 7.1 g/dL (ref 6.0–8.3)

## 2023-02-25 LAB — IBC + FERRITIN
Ferritin: 11.2 ng/mL (ref 10.0–291.0)
Iron: 45 ug/dL (ref 42–145)
Saturation Ratios: 10.5 % — ABNORMAL LOW (ref 20.0–50.0)
TIBC: 429.8 ug/dL (ref 250.0–450.0)
Transferrin: 307 mg/dL (ref 212.0–360.0)

## 2023-02-25 LAB — TSH: TSH: 2.28 u[IU]/mL (ref 0.35–5.50)

## 2023-02-25 LAB — HEMOGLOBIN A1C: Hgb A1c MFr Bld: 6.2 % (ref 4.6–6.5)

## 2023-02-25 LAB — LDL CHOLESTEROL, DIRECT: Direct LDL: 152 mg/dL

## 2023-02-25 NOTE — Assessment & Plan Note (Signed)
Aggravated by recent fall.  Referring to Dr Ernest Pine for left ankle instability

## 2023-02-25 NOTE — Assessment & Plan Note (Addendum)
Ova and parasite TESTS  were NEGATIVE

## 2023-02-25 NOTE — Addendum Note (Signed)
Addended by: Sherlene Shams on: 02/25/2023 01:52 PM   Modules accepted: Orders

## 2023-02-25 NOTE — Assessment & Plan Note (Addendum)
Testing for PA by Dr Barbie Banner was negative in 2023 ;  she continues to take an oral supplement  Lab Results  Component Value Date   VITAMINB12 834 08/05/2021

## 2023-02-25 NOTE — Assessment & Plan Note (Signed)
Excellent control with use of Lantus onl  continue 25 units daily. .  She has no proteinuria  And is taking an ARB. She is not taking a statin   Lab Results  Component Value Date   HGBA1C 6.0 08/24/2022   Lab Results  Component Value Date   MICROALBUR <0.7 09/16/2022   MICROALBUR 1.1 06/05/2021

## 2023-03-05 ENCOUNTER — Encounter: Payer: Self-pay | Admitting: Internal Medicine

## 2023-04-22 DIAGNOSIS — E669 Obesity, unspecified: Secondary | ICD-10-CM | POA: Diagnosis not present

## 2023-04-22 DIAGNOSIS — M25572 Pain in left ankle and joints of left foot: Secondary | ICD-10-CM | POA: Diagnosis not present

## 2023-04-22 DIAGNOSIS — E1169 Type 2 diabetes mellitus with other specified complication: Secondary | ICD-10-CM | POA: Diagnosis not present

## 2023-04-22 DIAGNOSIS — G8929 Other chronic pain: Secondary | ICD-10-CM | POA: Diagnosis not present

## 2023-05-19 ENCOUNTER — Other Ambulatory Visit (INDEPENDENT_AMBULATORY_CARE_PROVIDER_SITE_OTHER)

## 2023-05-19 DIAGNOSIS — D509 Iron deficiency anemia, unspecified: Secondary | ICD-10-CM

## 2023-05-19 LAB — CBC WITH DIFFERENTIAL/PLATELET
Basophils Absolute: 0 10*3/uL (ref 0.0–0.1)
Basophils Relative: 1 % (ref 0.0–3.0)
Eosinophils Absolute: 0.3 10*3/uL (ref 0.0–0.7)
Eosinophils Relative: 7.3 % — ABNORMAL HIGH (ref 0.0–5.0)
HCT: 32.9 % — ABNORMAL LOW (ref 36.0–46.0)
Hemoglobin: 10.7 g/dL — ABNORMAL LOW (ref 12.0–15.0)
Lymphocytes Relative: 38.6 % (ref 12.0–46.0)
Lymphs Abs: 1.4 10*3/uL (ref 0.7–4.0)
MCHC: 32.7 g/dL (ref 30.0–36.0)
MCV: 108.1 fl — ABNORMAL HIGH (ref 78.0–100.0)
Monocytes Absolute: 0.3 10*3/uL (ref 0.1–1.0)
Monocytes Relative: 8.3 % (ref 3.0–12.0)
Neutro Abs: 1.6 10*3/uL (ref 1.4–7.7)
Neutrophils Relative %: 44.8 % (ref 43.0–77.0)
Platelets: 270 10*3/uL (ref 150.0–400.0)
RBC: 3.04 Mil/uL — ABNORMAL LOW (ref 3.87–5.11)
RDW: 18.9 % — ABNORMAL HIGH (ref 11.5–15.5)
WBC: 3.7 10*3/uL — ABNORMAL LOW (ref 4.0–10.5)

## 2023-05-19 LAB — IBC + FERRITIN
Ferritin: 13.8 ng/mL (ref 10.0–291.0)
Iron: 114 ug/dL (ref 42–145)
Saturation Ratios: 30.5 % (ref 20.0–50.0)
TIBC: 373.8 ug/dL (ref 250.0–450.0)
Transferrin: 267 mg/dL (ref 212.0–360.0)

## 2023-05-20 ENCOUNTER — Encounter: Payer: Self-pay | Admitting: Internal Medicine

## 2023-05-25 ENCOUNTER — Encounter: Payer: Self-pay | Admitting: Internal Medicine

## 2023-05-25 ENCOUNTER — Ambulatory Visit (INDEPENDENT_AMBULATORY_CARE_PROVIDER_SITE_OTHER): Payer: Medicare Other | Admitting: Internal Medicine

## 2023-05-25 VITALS — BP 122/66 | HR 103 | Ht 64.0 in | Wt 257.8 lb

## 2023-05-25 DIAGNOSIS — G8929 Other chronic pain: Secondary | ICD-10-CM | POA: Diagnosis not present

## 2023-05-25 DIAGNOSIS — E119 Type 2 diabetes mellitus without complications: Secondary | ICD-10-CM | POA: Diagnosis not present

## 2023-05-25 DIAGNOSIS — I10 Essential (primary) hypertension: Secondary | ICD-10-CM | POA: Diagnosis not present

## 2023-05-25 DIAGNOSIS — E785 Hyperlipidemia, unspecified: Secondary | ICD-10-CM | POA: Diagnosis not present

## 2023-05-25 DIAGNOSIS — Z794 Long term (current) use of insulin: Secondary | ICD-10-CM | POA: Diagnosis not present

## 2023-05-25 DIAGNOSIS — D582 Other hemoglobinopathies: Secondary | ICD-10-CM | POA: Diagnosis not present

## 2023-05-25 DIAGNOSIS — M25512 Pain in left shoulder: Secondary | ICD-10-CM | POA: Diagnosis not present

## 2023-05-25 DIAGNOSIS — R195 Other fecal abnormalities: Secondary | ICD-10-CM

## 2023-05-25 DIAGNOSIS — D500 Iron deficiency anemia secondary to blood loss (chronic): Secondary | ICD-10-CM | POA: Diagnosis not present

## 2023-05-25 DIAGNOSIS — M25511 Pain in right shoulder: Secondary | ICD-10-CM | POA: Diagnosis not present

## 2023-05-25 DIAGNOSIS — B839 Helminthiasis, unspecified: Secondary | ICD-10-CM

## 2023-05-25 NOTE — Assessment & Plan Note (Signed)
 Etiology unclear given its persistence despite normalization of iron.  Eosinophilia is relative and mild, FOBT needed and given to patient.  Checking SPEP and IFE, referring  to hematology   Lab Results  Component Value Date   WBC 3.7 (L) 05/19/2023   HGB 10.7 (L) 05/19/2023   HCT 32.9 (L) 05/19/2023   MCV 108.1 (H) 05/19/2023   PLT 270.0 05/19/2023   Lab Results  Component Value Date   IRON 114 05/19/2023   TIBC 373.8 05/19/2023   FERRITIN 13.8 05/19/2023

## 2023-05-25 NOTE — Progress Notes (Signed)
 Subjective:  Patient ID: Brenda Floyd, female    DOB: 18-Sep-1940  Age: 83 y.o. MRN: [id number]  CC: The primary encounter diagnosis was Essential hypertension. Diagnoses of Type 2 diabetes mellitus with insulin therapy (HCC), Hyperlipidemia with target LDL less than 70, Worms in stool, Iron deficiency anemia due to chronic blood loss, Other hemoglobinopathies (HCC), Chronic pain of both shoulders, Morbid obesity (HCC), Chronic left shoulder pain, and Abnormal findings in stool were also pertinent to this visit.   HPI Brenda Floyd presents for  Chief Complaint  Patient presents with   Medical Management of Chronic Issues    3 month follow up    1) T2DM:  She  feels generally well,  But is not  exercising regularly or trying to lose weight. Checking  blood sugars less than once daily at variable times, usually only if she feels she may be having a hypoglycemic event. .  BS have been under 130 fasting and < 150 post prandially.  Denies any recent hypoglyemic events.  Taking 25 untis of lantus daily   medications as directed. Following a carbohydrate modified diet 6 days per week. Denies numbness, burning and tingling of extremities. Appetite is good.  She has no contraindications to use of GLP agonist but has deferred  because she read that muscle mass is irrevocably lost.   2)  anemia ; persistent despite normalization of iron.  Home tests for  fecal occult blood have been negative  x 2 .  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  3) bilateral shoulder pain.  Taking 650 mg aspirin nightly for the past 3 months and turmeric  . Not taking  PPI due to concern for too much acid suppression .    4) ankle pain : saw pugliesi at [facility] ;  hardware was stable by plain films.  Given a canvas sling  which she is not wearing .  Wants to see Cristy Hilts.   Outpatient Medications Prior to  Visit  Medication Sig Dispense Refill   aspirin 81 MG tablet Take 1 tablet (81 mg total) by mouth daily. 30 tablet 11   Barberry-Oreg Grape-Goldenseal (BERBERINE COMPLEX PO) Take 1 capsule by mouth 3 (three) times daily before meals.     Cholecalciferol 125 MCG (5000 UT) capsule Take 5,000 Units by mouth daily.     Continuous Glucose Sensor (FREESTYLE LIBRE 3 SENSOR) MISC Use to check blood sugars continuously 6 each 3   Cyanocobalamin (B-12) 5000 MCG CAPS Take 1 capsule by mouth daily.     fexofenadine (ALLEGRA) 180 MG tablet Take 180 mg by mouth daily.     insulin glargine (LANTUS SOLOSTAR) 100 UNIT/ML Solostar Pen INJECT 25 UNITS UNDER THE SKIN DAILY 15 mL 5   Iron, Ferrous Sulfate, 325 (65 Fe) MG TABS Take 325 mg by mouth daily. 90 tablet 1   levothyroxine (SYNTHROID) 88 MCG tablet TAKE 1 TABLET DAILY FOR HYPOTHYROIDISM 90 tablet 3   magnesium gluconate (MAGONATE) 500 MG tablet Take 500 mg by mouth 2 (two) times daily.     PARoxetine (PAXIL) 20 MG tablet TAKE 1 TABLET DAILY 90 tablet 3   telmisartan (MICARDIS) 40 MG tablet TAKE 1 TABLET AT BEDTIME 90 tablet 3   TURMERIC PO Take by mouth daily.     No facility-administered medications prior to visit.    Review of Systems;  Patient denies headache, fevers,  malaise, unintentional weight loss, skin rash, eye pain, sinus congestion and sinus pain, sore throat, dysphagia,  hemoptysis , cough, dyspnea, wheezing, chest pain, palpitations, orthopnea, edema, abdominal pain, nausea, melena, diarrhea, constipation, flank pain, dysuria, hematuria, urinary  Frequency, nocturia, numbness, tingling, seizures,  Focal weakness, Loss of consciousness,  Tremor, insomnia, depression, anxiety, and suicidal ideation.      Objective:  BP 122/66   Pulse (!) 103   Ht 5\' 4"  (1.626 m)   Wt 257 lb 12.8 oz (116.9 kg)   SpO2 95%   BMI 44.25 kg/m   BP Readings from Last 3 Encounters:  05/25/23 122/66  02/24/23 124/76  09/16/22 132/86    Wt Readings from  Last 3 Encounters:  05/25/23 257 lb 12.8 oz (116.9 kg)  02/24/23 257 lb 12.8 oz (116.9 kg)  12/02/22 267 lb (121.1 kg)    Physical Exam  Lab Results  Component Value Date   HGBA1C 6.2 02/24/2023   HGBA1C 6.0 08/24/2022   HGBA1C 6.6 (H) 12/05/2021    Lab Results  Component Value Date   CREATININE 0.77 02/24/2023   CREATININE 0.97 08/24/2022   CREATININE 0.80 12/05/2021    Lab Results  Component Value Date   WBC 3.7 (L) 05/19/2023   HGB 10.7 (L) 05/19/2023   HCT 32.9 (L) 05/19/2023   PLT 270.0 05/19/2023   GLUCOSE 82 02/24/2023   CHOL 231 (H) 02/24/2023   TRIG 208.0 (H) 02/24/2023   HDL 54.20 02/24/2023   LDLDIRECT 152.0 02/24/2023   LDLCALC 135 (H) 02/24/2023   ALT 17 02/24/2023   AST 17 02/24/2023   NA 139 02/24/2023   K 4.6 02/24/2023   CL 104 02/24/2023   CREATININE 0.77 02/24/2023   BUN 20 02/24/2023   CO2 25 02/24/2023   TSH 2.28 02/24/2023   HGBA1C 6.2 02/24/2023   MICROALBUR <0.7 09/16/2022    No results found.  Assessment & Plan:  .Essential hypertension -     Microalbumin / creatinine urine ratio; Future  Type 2 diabetes mellitus with insulin therapy (HCC) Assessment & Plan: Excellent control with use of Lantus at  25 units daily. .  She has no proteinuria  And is taking an ARB. She is not taking a statin   Lab Results  Component Value Date   HGBA1C 6.2 02/24/2023   Lab Results  Component Value Date   MICROALBUR <0.7 09/16/2022   MICROALBUR 1.1 06/05/2021       Orders: -     Hemoglobin A1c -     Comprehensive metabolic panel with GFR -     Microalbumin / creatinine urine ratio; Future  Hyperlipidemia with target LDL less than 70 -     Lipid panel -     LDL cholesterol, direct  Worms in stool Assessment & Plan: Stool was negative ifor ova and parasites in August 2024 after a sighting .  but she is willing to repeat testing randomly to increase sensitivity.   Orders: -     Ova and parasite examination; Future  Iron deficiency  anemia due to chronic blood loss Assessment & Plan: Etiology unclear given its persistence despite normalization of iron.  Eosinophilia is relative and mild, FOBT needed and given to patient.  Checking SPEP and IFE, referring  to hematology   Lab Results  Component Value Date   WBC 3.7 (L) 05/19/2023   HGB 10.7 (L) 05/19/2023   HCT 32.9 (L) 05/19/2023   MCV 108.1 (H) 05/19/2023   PLT 270.0 05/19/2023  Lab Results  Component Value Date   IRON 114 05/19/2023   TIBC 373.8 05/19/2023   FERRITIN 13.8 05/19/2023     Orders: -     Protein electrophoresis, serum -     IFE AND PE, RANDOM URINE; Future  Other hemoglobinopathies (HCC) -     Ambulatory referral to Hematology / Oncology  Chronic pain of both shoulders -     Ambulatory referral to Orthopedic Surgery  Morbid obesity [facility]) Assessment & Plan: Complicated by diabetes, hypertension and OA  of knees and ankle.  She has declined  therapy with Mounjaro or ozempic .  I have addressed  BMI and recommended wt loss of 10% of body weight over the next 6 months using a low fat, fruit/vegetable based Mediteranean diet and regular exercise a minimum of 5 days per week.     Chronic left shoulder pain Assessment & Plan:  Referring to Bert Britain for repeat evaluation of both shoulders    Abnormal findings in stool Assessment & Plan: Stool was negative ifor ova and parasites in August 2024 after a sighting .  but she is willing to repeat testing randomly to increase sensitivity.       I spent 34 minutes on the day of this face to face encounter reviewing patient's  most recent visit with cardiology,  nephrology,  and neurology,  prior relevant surgical and non surgical procedures, recent  labs and imaging studies, counseling on weight management,  reviewing the assessment and plan with patient, and post visit ordering and reviewing of  diagnostics and therapeutics with patient  .   Follow-up: Return in about 3 months (around  08/24/2023) for follow up diabetes.   Brenda Flax, MD

## 2023-05-25 NOTE — Patient Instructions (Signed)
 Referral to hematology  for further workup up anemia  Referral to [facility] in [place]  I WILL REACH OUT TO G[facility] CARPENTER   I recommend 3 weekly collections of stool for parasites

## 2023-05-25 NOTE — Assessment & Plan Note (Signed)
 Excellent control with use of Lantus at  25 units daily. .  She has no proteinuria  And is taking an ARB. She is not taking a statin   Lab Results  Component Value Date   HGBA1C 6.2 02/24/2023   Lab Results  Component Value Date   MICROALBUR <0.7 09/16/2022   MICROALBUR 1.1 06/05/2021

## 2023-05-25 NOTE — Assessment & Plan Note (Addendum)
 Stool was negative ifor ova and parasites in August 2024 after a sighting .  but she is willing to repeat testing randomly to increase sensitivity.

## 2023-05-25 NOTE — Assessment & Plan Note (Signed)
 Referring to Brenda Floyd for repeat evaluation of both shoulders

## 2023-05-25 NOTE — Assessment & Plan Note (Addendum)
 Complicated by diabetes, hypertension and OA  of knees and ankle.  She has declined  therapy with Mounjaro or ozempic .  I have addressed  BMI and recommended wt loss of 10% of body weight over the next 6 months using a low fat, fruit/vegetable based Mediteranean diet and regular exercise a minimum of 5 days per week.

## 2023-05-26 LAB — COMPREHENSIVE METABOLIC PANEL WITH GFR
ALT: 12 U/L (ref 0–35)
AST: 11 U/L (ref 0–37)
Albumin: 4.1 g/dL (ref 3.5–5.2)
Alkaline Phosphatase: 73 U/L (ref 39–117)
BUN: 17 mg/dL (ref 6–23)
CO2: 28 meq/L (ref 19–32)
Calcium: 9.7 mg/dL (ref 8.4–10.5)
Chloride: 104 meq/L (ref 96–112)
Creatinine, Ser: 0.87 mg/dL (ref 0.40–1.20)
GFR: 61.01 mL/min (ref >60.00)
Glucose, Bld: 105 mg/dL — ABNORMAL HIGH (ref 70–99)
Potassium: 4.8 meq/L (ref 3.5–5.1)
Sodium: 141 meq/L (ref 135–145)
Total Bilirubin: 0.3 mg/dL (ref 0.2–1.2)
Total Protein: 6.7 g/dL (ref 6.0–8.3)

## 2023-05-26 LAB — LIPID PANEL
Cholesterol: 218 mg/dL — ABNORMAL HIGH (ref 0–200)
HDL: 51.7 mg/dL (ref >39.00)
LDL Cholesterol: 94 mg/dL (ref 0–99)
NonHDL: 166.01
Total CHOL/HDL Ratio: 4
Triglycerides: 362 mg/dL — ABNORMAL HIGH (ref 0.0–149.0)
VLDL: 72.4 mg/dL — ABNORMAL HIGH (ref 0.0–40.0)

## 2023-05-26 LAB — LDL CHOLESTEROL, DIRECT: Direct LDL: 142 mg/dL

## 2023-05-26 LAB — HEMOGLOBIN A1C: Hgb A1c MFr Bld: 5.9 % (ref 4.6–6.5)

## 2023-05-27 ENCOUNTER — Encounter: Payer: Self-pay | Admitting: Internal Medicine

## 2023-05-27 LAB — PROTEIN ELECTROPHORESIS, SERUM
Albumin ELP: 4 g/dL (ref 3.8–4.8)
Alpha 1: 0.3 g/dL (ref 0.2–0.3)
Alpha 2: 0.8 g/dL (ref 0.5–0.9)
Beta 2: 0.4 g/dL (ref 0.2–0.5)
Beta Globulin: 0.5 g/dL (ref 0.4–0.6)
Gamma Globulin: 0.8 g/dL (ref 0.8–1.7)
Total Protein: 6.7 g/dL (ref 6.1–8.1)

## 2023-06-01 ENCOUNTER — Encounter: Payer: Self-pay | Admitting: Internal Medicine

## 2023-06-01 ENCOUNTER — Inpatient Hospital Stay

## 2023-06-01 ENCOUNTER — Inpatient Hospital Stay: Attending: Internal Medicine | Admitting: Internal Medicine

## 2023-06-01 VITALS — BP 134/67 | HR 91 | Temp 97.7°F | Resp 20 | Ht 64.0 in | Wt 260.4 lb

## 2023-06-01 DIAGNOSIS — Z803 Family history of malignant neoplasm of breast: Secondary | ICD-10-CM | POA: Diagnosis not present

## 2023-06-01 DIAGNOSIS — G4733 Obstructive sleep apnea (adult) (pediatric): Secondary | ICD-10-CM | POA: Diagnosis not present

## 2023-06-01 DIAGNOSIS — Z806 Family history of leukemia: Secondary | ICD-10-CM | POA: Insufficient documentation

## 2023-06-01 DIAGNOSIS — Z800 Family history of malignant neoplasm of digestive organs: Secondary | ICD-10-CM | POA: Diagnosis not present

## 2023-06-01 DIAGNOSIS — D539 Nutritional anemia, unspecified: Secondary | ICD-10-CM | POA: Insufficient documentation

## 2023-06-01 DIAGNOSIS — E119 Type 2 diabetes mellitus without complications: Secondary | ICD-10-CM | POA: Insufficient documentation

## 2023-06-01 DIAGNOSIS — Z833 Family history of diabetes mellitus: Secondary | ICD-10-CM | POA: Diagnosis not present

## 2023-06-01 DIAGNOSIS — Z79899 Other long term (current) drug therapy: Secondary | ICD-10-CM | POA: Insufficient documentation

## 2023-06-01 DIAGNOSIS — E034 Atrophy of thyroid (acquired): Secondary | ICD-10-CM | POA: Insufficient documentation

## 2023-06-01 DIAGNOSIS — Z87891 Personal history of nicotine dependence: Secondary | ICD-10-CM | POA: Diagnosis not present

## 2023-06-01 DIAGNOSIS — Z8249 Family history of ischemic heart disease and other diseases of the circulatory system: Secondary | ICD-10-CM | POA: Diagnosis not present

## 2023-06-01 DIAGNOSIS — D7589 Other specified diseases of blood and blood-forming organs: Secondary | ICD-10-CM | POA: Diagnosis not present

## 2023-06-01 DIAGNOSIS — I10 Essential (primary) hypertension: Secondary | ICD-10-CM | POA: Insufficient documentation

## 2023-06-01 NOTE — Progress Notes (Signed)
 [facility] CONSULT NOTE  Patient Care Team: Thersia Flax, MD as PCP - General (Internal Medicine) Rosa College, MD as Consulting Physician (Ophthalmology) Gwyn Leos, MD as Consulting Physician (Oncology) Gwyn Leos, MD as Consulting Physician (Hematology and Oncology)  CHIEF COMPLAINTS/PURPOSE OF CONSULTATION: ANEMIA   HEMATOLOGY HISTORY  # ANEMIA[Hb; MCV-platelets- WBC; Iron  sat; ferritin;  GFR- CT/US ; EGD/colonoscopy-  HISTORY OF PRESENTING ILLNESS: Patient ambulating-independently  Alone    Brenda Floyd 83 y.o.  female pleasant patient was been referred to us  for further evaluation of macrocytic anemia.  Patient is anemic for the last 2 years.  Patient is not very symptomatic.  She complains of mild shortness of breath on exertion.  Otherwise resting denies any fatigue or shortness of breath.  B12- levels; folic acid -  Chemotherapy drugs including methotrexate:  Alcohol: none Liver disease: none    Review of Systems  Constitutional:  Negative for chills, diaphoresis, fever, malaise/fatigue and weight loss.  HENT:  Negative for nosebleeds and sore throat.   Eyes:  Negative for double vision.  Respiratory:  Negative for cough, hemoptysis, sputum production, shortness of breath and wheezing.   Cardiovascular:  Negative for chest pain, palpitations, orthopnea and leg swelling.  Gastrointestinal:  Negative for abdominal pain, blood in stool, constipation, diarrhea, heartburn, melena, nausea and vomiting.  Genitourinary:  Negative for dysuria, frequency and urgency.  Musculoskeletal:  Negative for back pain and joint pain.  Skin: Negative.  Negative for itching and rash.  Neurological:  Negative for dizziness, tingling, focal weakness, weakness and headaches.  Endo/Heme/Allergies:  Does not bruise/bleed easily.  Psychiatric/Behavioral:  Negative for depression. The patient is not nervous/anxious and does not have insomnia.       MEDICAL HISTORY:  Past Medical History:  Diagnosis Date   Arthritis    neck, knees, left shoulder   Blunt head trauma 02/24/2023   Head CT report from Dec 23  [facility] [place]     The gray-white matter differentiation is intact. There is no evidence of acute infarct, hemorrhage, mass or mass effect. There is a prominence of the ventricles and sulci consistent with diffuse cortical volume loss. There are periventricular low-attenuation white matter changes, likely small vessel ischemic disease. The visualized paranasal sinu   Cancer (HCC)    skin ca   Colon polyps 2012   [facility], followup due 2015   Diabetes mellitus    Diverticulosis of sigmoid colon 08/2010   by colonoscopy   Dyspnea    Fracture closed, femur, shaft (HCC) remote   s/p screws and rod repair   Hypertension    Thyroid  disease     SURGICAL HISTORY: Past Surgical History:  Procedure Laterality Date   ABDOMINAL HYSTERECTOMY     BREAST CYST ASPIRATION Left    neg   CATARACT EXTRACTION W/PHACO Right 04/06/2017   Procedure: CATARACT EXTRACTION PHACO AND INTRAOCULAR LENS PLACEMENT (IOC) RIGHT DIABETIC;  Surgeon: Rosa College, MD;  Location: [facility];  Service: Ophthalmology;  Laterality: Right;   CATARACT EXTRACTION W/PHACO Left 05/04/2017   Procedure: CATARACT EXTRACTION PHACO AND INTRAOCULAR LENS PLACEMENT (IOC) LEFT DIABETIC;  Surgeon: Rosa College, MD;  Location: [facility];  Service: Ophthalmology;  Laterality: Left;  DIABETIC   JOINT REPLACEMENT     bilateral knee, 2011    SOCIAL HISTORY: Social History   Socioeconomic History   Marital status: Single    Spouse name: Not on file   Number of children: 0  Years of education: Not on file   Highest education level: Master's degree (e.g., MA, MS, MEng, MEd, MSW, MBA)  Occupational History   Occupation: retired  Tobacco Use   Smoking status: Former    Current packs/day: 0.00    Average packs/day: 0.8 packs/day for 35.7  years (26.8 ttl pk-yrs)    Types: Cigarettes    Start date: 67    Quit date: 10/30/1990    Years since quitting: 32.6   Smokeless tobacco: Never  Vaping Use   Vaping status: Never Used  Substance and Sexual Activity   Alcohol use: No   Drug use: No   Sexual activity: Never  Other Topics Concern   Not on file  Social History Narrative   Never married, no children   Social Drivers of Corporate investment banker Strain: Low Risk  (02/23/2023)   Overall Financial Resource Strain (CARDIA)    Difficulty of Paying Living Expenses: Not very hard  Food Insecurity: No Food Insecurity (06/01/2023)   Hunger Vital Sign    Worried About Running Out of Food in the Last Year: Never true    Ran Out of Food in the Last Year: Never true  Transportation Needs: No Transportation Needs (06/01/2023)   PRAPARE - Administrator, Civil Service (Medical): No    Lack of Transportation (Non-Medical): No  Physical Activity: Inactive (02/23/2023)   Exercise Vital Sign    Days of Exercise per Week: 0 days    Minutes of Exercise per Session: 0 min  Stress: No Stress Concern Present (02/23/2023)   [employer] of Occupational Health - Occupational Stress Questionnaire    Feeling of Stress : Only a little  Social Connections: Socially Isolated (02/23/2023)   Social Connection and Isolation Panel [NHANES]    Frequency of Communication with Friends and Family: Once a week    Frequency of Social Gatherings with Friends and Family: Once a week    Attends Religious Services: Never    Database administrator or Organizations: No    Attends Banker Meetings: Never    Marital Status: Never married  Intimate Partner Violence: Not At Risk (06/01/2023)   Humiliation, Afraid, Rape, and Kick questionnaire    Fear of Current or Ex-Partner: No    Emotionally Abused: No    Physically Abused: No    Sexually Abused: No    FAMILY HISTORY: Family History  Problem Relation Age of Onset   Cancer  Mother        breast cancer and colon ca   Breast cancer Mother 75   Colon cancer Mother    Cancer Father        leukemia   Diabetes Sister    Hypertension Sister    Breast cancer Sister 99   Breast cancer Maternal Aunt    Breast cancer Cousin        maternal 1st cousin    ALLERGIES:  is allergic to crestor  [rosuvastatin ].  MEDICATIONS:  Current Outpatient Medications  Medication Sig Dispense Refill   aspirin  81 MG tablet Take 1 tablet (81 mg total) by mouth daily. 30 tablet 11   Barberry-Oreg Grape-Goldenseal (BERBERINE COMPLEX PO) Take 1 capsule by mouth 3 (three) times daily before meals.     Cholecalciferol 125 MCG (5000 UT) capsule Take 5,000 Units by mouth daily.     Continuous Glucose Sensor (FREESTYLE LIBRE 3 SENSOR) MISC Use to check blood sugars continuously 6 each 3   Cyanocobalamin  (B-12) 5000 MCG  CAPS Take 1 capsule by mouth daily.     fexofenadine (ALLEGRA) 180 MG tablet Take 180 mg by mouth daily.     insulin  glargine (LANTUS  SOLOSTAR) 100 UNIT/ML Solostar Pen INJECT 25 UNITS UNDER THE SKIN DAILY 15 mL 5   Iron , Ferrous Sulfate , 325 (65 Fe) MG TABS Take 325 mg by mouth daily. 90 tablet 1   levothyroxine  (SYNTHROID ) 88 MCG tablet TAKE 1 TABLET DAILY FOR HYPOTHYROIDISM 90 tablet 3   magnesium gluconate (MAGONATE) 500 MG tablet Take 500 mg by mouth 2 (two) times daily.     PARoxetine  (PAXIL ) 20 MG tablet TAKE 1 TABLET DAILY 90 tablet 3   telmisartan  (MICARDIS ) 40 MG tablet TAKE 1 TABLET AT BEDTIME 90 tablet 3   TURMERIC PO Take by mouth daily.     No current facility-administered medications for this visit.     Aaron Aas  PHYSICAL EXAMINATION:   Vitals:   06/01/23 1405  BP: 134/67  Pulse: 91  Resp: 20  Temp: 97.7 F (36.5 C)  SpO2: 95%   Filed Weights   06/01/23 1405  Weight: 260 lb 6.4 oz (118.1 kg)    Physical Exam Vitals and nursing note reviewed.  HENT:     Head: Normocephalic and atraumatic.     Mouth/Throat:     Pharynx: Oropharynx is clear.   Eyes:     Extraocular Movements: Extraocular movements intact.     Pupils: Pupils are equal, round, and reactive to light.  Cardiovascular:     Rate and Rhythm: Normal rate and regular rhythm.  Pulmonary:     Comments: Decreased breath sounds bilaterally.  Abdominal:     Palpations: Abdomen is soft.  Musculoskeletal:        General: Normal range of motion.     Cervical back: Normal range of motion.  Skin:    General: Skin is warm.  Neurological:     General: No focal deficit present.     Mental Status: She is alert and oriented to person, place, and time.  Psychiatric:        Behavior: Behavior normal.        Judgment: Judgment normal.      LABORATORY DATA:  I have reviewed the data as listed Lab Results  Component Value Date   WBC 3.7 (L) 05/19/2023   HGB 10.7 (L) 05/19/2023   HCT 32.9 (L) 05/19/2023   MCV 108.1 (H) 05/19/2023   PLT 270.0 05/19/2023   Recent Labs    08/24/22 1628 02/24/23 1551 05/25/23 1443  NA 139 139 141  K 4.4 4.6 4.8  CL 106 104 104  CO2 25 25 28   GLUCOSE 103* 82 105*  BUN 26* 20 17  CREATININE 0.97 0.77 0.87  CALCIUM  9.5 9.3 9.7  PROT 7.3 7.1 6.7  6.7  ALBUMIN 4.3 4.3 4.1  AST 16 17 11   ALT 17 17 12   ALKPHOS 56 58 73  BILITOT 0.4 0.3 0.3     No results found.  ASSESSMENT & PLAN:   Macrocytic anemia Anemia : Hemoglobin: 10 [since 2023] MCV -102- normal white count/platelets.  Fairly asymptomatic. # Patient currently on B12 and folic acid .  No obvious evidence of hemolysis patient does not drink any alcohol.  No history of any liver disease.  Discussed multiple etiologies of macrocytosis-including nutritional deficiency like B12 folic acid ; medications/including alcohol; and liver disease is.  Also primary bone marrow disorders like myelodysplastic syndromes etc. are also included in the differential.  Discussed with the patient  that MDS is high in the differential.   # AT NEXT VISIT- I would recommend CBC CMP LDH peripheral  smear; haptoglobin; erythropoietin ; iron  studies ferritin B12 folic acid ; reticulocyte count; myeloma panel kappa lambda light chain ratio.  If no obvious cause noted would recommend further work-up including a bone marrow biopsy.    # DM- on insulin - stable.   # OSA on CPAP- stable.   Thank you Dr.Tullo for allowing me to participate in the care of your pleasant patient. Please do not hesitate to contact me with questions or concerns in the interim.  # follow up in 3 months- please order- CBC/ CMP; LDH; retic count; haptoglobin; Erythropoietin ; b12; folic acid ; myeloma panel kappa lambda light chain ratio-  Dr.B    All questions were answered. The patient knows to call the clinic with any problems, questions or concerns.    Gwyn Leos, MD 06/01/2023 4:30 PM

## 2023-06-01 NOTE — Assessment & Plan Note (Addendum)
 Anemia : Hemoglobin: 10 [since 2023] MCV -102- normal white count/platelets.  Fairly asymptomatic. # Patient currently on B12 and folic acid .  No obvious evidence of hemolysis patient does not drink any alcohol.  No history of any liver disease.  Discussed multiple etiologies of macrocytosis-including nutritional deficiency like B12 folic acid ; medications/including alcohol; and liver disease is.  Also primary bone marrow disorders like myelodysplastic syndromes etc. are also included in the differential.  Discussed with the patient that MDS is high in the differential.   # AT NEXT VISIT- I would recommend CBC CMP LDH peripheral smear; haptoglobin; erythropoietin ; iron  studies ferritin B12 folic acid ; reticulocyte count; myeloma panel kappa lambda light chain ratio.  If no obvious cause noted would recommend further work-up including a bone marrow biopsy.    # DM- on insulin - stable.   # OSA on CPAP- stable.   Thank you Dr.Tullo for allowing me to participate in the care of your pleasant patient. Please do not hesitate to contact me with questions or concerns in the interim.  # follow up in 3 months- please order- CBC/ CMP; LDH; retic count; haptoglobin; Erythropoietin ; b12; folic acid ; myeloma panel kappa lambda light chain ratio-  Dr.B

## 2023-06-01 NOTE — Progress Notes (Signed)
 Fatigue/weakness: NO Dyspena: SOB WITH WALKING Light headedness: NO Blood in stool: NO

## 2023-06-03 ENCOUNTER — Other Ambulatory Visit
Admission: RE | Admit: 2023-06-03 | Discharge: 2023-06-03 | Disposition: A | Discharge status: Home / Self Care | Source: Ambulatory Visit | Attending: Internal Medicine | Admitting: Internal Medicine

## 2023-06-03 DIAGNOSIS — B839 Helminthiasis, unspecified: Secondary | ICD-10-CM | POA: Insufficient documentation

## 2023-06-08 ENCOUNTER — Encounter: Payer: Self-pay | Admitting: Internal Medicine

## 2023-06-08 NOTE — Telephone Encounter (Signed)
 It says it is still process.

## 2023-06-09 ENCOUNTER — Encounter: Payer: Self-pay | Admitting: Internal Medicine

## 2023-06-09 LAB — O&P RESULT

## 2023-06-09 LAB — OVA + PARASITE EXAM

## 2023-08-03 DIAGNOSIS — R1110 Vomiting, unspecified: Secondary | ICD-10-CM | POA: Diagnosis not present

## 2023-08-03 DIAGNOSIS — R29890 Loss of height: Secondary | ICD-10-CM | POA: Diagnosis not present

## 2023-08-03 DIAGNOSIS — W19XXXA Unspecified fall, initial encounter: Secondary | ICD-10-CM | POA: Diagnosis not present

## 2023-08-03 DIAGNOSIS — I10 Essential (primary) hypertension: Secondary | ICD-10-CM | POA: Diagnosis not present

## 2023-08-03 DIAGNOSIS — R4182 Altered mental status, unspecified: Secondary | ICD-10-CM | POA: Diagnosis not present

## 2023-08-03 DIAGNOSIS — M4802 Spinal stenosis, cervical region: Secondary | ICD-10-CM | POA: Diagnosis not present

## 2023-08-03 DIAGNOSIS — R319 Hematuria, unspecified: Secondary | ICD-10-CM | POA: Diagnosis not present

## 2023-08-03 DIAGNOSIS — M25512 Pain in left shoulder: Secondary | ICD-10-CM | POA: Diagnosis not present

## 2023-08-03 DIAGNOSIS — M25511 Pain in right shoulder: Secondary | ICD-10-CM | POA: Diagnosis not present

## 2023-08-03 DIAGNOSIS — R339 Retention of urine, unspecified: Secondary | ICD-10-CM | POA: Diagnosis not present

## 2023-08-03 DIAGNOSIS — R948 Abnormal results of function studies of other organs and systems: Secondary | ICD-10-CM | POA: Diagnosis not present

## 2023-08-03 DIAGNOSIS — E785 Hyperlipidemia, unspecified: Secondary | ICD-10-CM | POA: Diagnosis not present

## 2023-08-03 DIAGNOSIS — G4733 Obstructive sleep apnea (adult) (pediatric): Secondary | ICD-10-CM | POA: Diagnosis not present

## 2023-08-03 DIAGNOSIS — R748 Abnormal levels of other serum enzymes: Secondary | ICD-10-CM | POA: Diagnosis not present

## 2023-08-03 DIAGNOSIS — Z79899 Other long term (current) drug therapy: Secondary | ICD-10-CM | POA: Diagnosis not present

## 2023-08-03 DIAGNOSIS — M47812 Spondylosis without myelopathy or radiculopathy, cervical region: Secondary | ICD-10-CM | POA: Diagnosis not present

## 2023-08-03 DIAGNOSIS — H748X3 Other specified disorders of middle ear and mastoid, bilateral: Secondary | ICD-10-CM | POA: Diagnosis not present

## 2023-08-03 DIAGNOSIS — R531 Weakness: Secondary | ICD-10-CM | POA: Diagnosis not present

## 2023-08-03 DIAGNOSIS — M2578 Osteophyte, vertebrae: Secondary | ICD-10-CM | POA: Diagnosis not present

## 2023-08-03 DIAGNOSIS — R2689 Other abnormalities of gait and mobility: Secondary | ICD-10-CM | POA: Diagnosis not present

## 2023-08-03 DIAGNOSIS — E119 Type 2 diabetes mellitus without complications: Secondary | ICD-10-CM | POA: Diagnosis not present

## 2023-08-03 DIAGNOSIS — Z9181 History of falling: Secondary | ICD-10-CM | POA: Diagnosis not present

## 2023-08-03 DIAGNOSIS — I700 Atherosclerosis of aorta: Secondary | ICD-10-CM | POA: Diagnosis not present

## 2023-08-03 DIAGNOSIS — R296 Repeated falls: Secondary | ICD-10-CM | POA: Diagnosis not present

## 2023-08-03 DIAGNOSIS — E039 Hypothyroidism, unspecified: Secondary | ICD-10-CM | POA: Diagnosis not present

## 2023-08-03 DIAGNOSIS — N390 Urinary tract infection, site not specified: Secondary | ICD-10-CM | POA: Diagnosis not present

## 2023-08-03 DIAGNOSIS — R32 Unspecified urinary incontinence: Secondary | ICD-10-CM | POA: Diagnosis not present

## 2023-08-03 DIAGNOSIS — Z043 Encounter for examination and observation following other accident: Secondary | ICD-10-CM | POA: Diagnosis not present

## 2023-08-03 DIAGNOSIS — D649 Anemia, unspecified: Secondary | ICD-10-CM | POA: Diagnosis not present

## 2023-08-03 DIAGNOSIS — Z79890 Hormone replacement therapy (postmenopausal): Secondary | ICD-10-CM | POA: Diagnosis not present

## 2023-08-04 ENCOUNTER — Encounter: Payer: Self-pay | Admitting: Internal Medicine

## 2023-08-04 DIAGNOSIS — R7881 Bacteremia: Secondary | ICD-10-CM | POA: Diagnosis not present

## 2023-08-04 DIAGNOSIS — W19XXXA Unspecified fall, initial encounter: Secondary | ICD-10-CM | POA: Diagnosis not present

## 2023-08-04 DIAGNOSIS — I10 Essential (primary) hypertension: Secondary | ICD-10-CM | POA: Diagnosis not present

## 2023-08-04 DIAGNOSIS — E785 Hyperlipidemia, unspecified: Secondary | ICD-10-CM | POA: Diagnosis not present

## 2023-08-04 DIAGNOSIS — D7589 Other specified diseases of blood and blood-forming organs: Secondary | ICD-10-CM | POA: Diagnosis not present

## 2023-08-19 ENCOUNTER — Inpatient Hospital Stay: Admitting: Nurse Practitioner

## 2023-08-24 ENCOUNTER — Encounter: Payer: Self-pay | Admitting: Internal Medicine

## 2023-08-24 ENCOUNTER — Ambulatory Visit (INDEPENDENT_AMBULATORY_CARE_PROVIDER_SITE_OTHER): Admitting: Internal Medicine

## 2023-08-24 VITALS — BP 124/76 | HR 100 | Ht 64.0 in | Wt 245.8 lb

## 2023-08-24 DIAGNOSIS — I10 Essential (primary) hypertension: Secondary | ICD-10-CM | POA: Diagnosis not present

## 2023-08-24 DIAGNOSIS — E034 Atrophy of thyroid (acquired): Secondary | ICD-10-CM

## 2023-08-24 DIAGNOSIS — N3946 Mixed incontinence: Secondary | ICD-10-CM

## 2023-08-24 DIAGNOSIS — E785 Hyperlipidemia, unspecified: Secondary | ICD-10-CM

## 2023-08-24 DIAGNOSIS — E119 Type 2 diabetes mellitus without complications: Secondary | ICD-10-CM | POA: Diagnosis not present

## 2023-08-24 DIAGNOSIS — Z794 Long term (current) use of insulin: Secondary | ICD-10-CM

## 2023-08-24 DIAGNOSIS — D509 Iron deficiency anemia, unspecified: Secondary | ICD-10-CM | POA: Diagnosis not present

## 2023-08-24 LAB — MICROALBUMIN / CREATININE URINE RATIO
Creatinine,U: 58.1 mg/dL
Microalb Creat Ratio: UNDETERMINED mg/g (ref 0.0–30.0)
Microalb, Ur: <0.7 mg/dL

## 2023-08-24 MED ORDER — IRON (FERROUS SULFATE) 325 (65 FE) MG PO TABS: 325.0000 mg | ORAL_TABLET | Freq: Every day | ORAL | 1 refills | Status: AC

## 2023-08-24 NOTE — Progress Notes (Addendum)
 Subjective:  Patient ID: Brenda Floyd, female    DOB: 1940/06/11  Age: 83 y.o. MRN: [id number]  CC: The primary encounter diagnosis was Essential hypertension. Diagnoses of Type 2 diabetes mellitus with insulin  therapy (HCC), Hypothyroidism due to acquired atrophy of thyroid , Hyperlipidemia with target LDL less than 70, Mixed stress and urge urinary incontinence, Type 2 diabetes mellitus without complication, without long-term current use of insulin  (HCC), and Iron  deficiency anemia, unspecified iron  deficiency anemia type were also pertinent to this visit.   HPI Brenda Floyd presents for  Chief Complaint  Patient presents with   Medical Management of Chronic Issues   1) recent hospitalization overnight at [facility] for fall following a vomiting episode.   Fall was attributed to hypotension and telmisartan  was suspended. Brenda Floyd     2) chronic urinary incontinece , mixed;  she was  noted to have asymptomatic bacteriuria  and question of urinary retention raised due to collection of 900 ccs via cath  bc she was unable to void  . Has had chronic incontinence despite hysterectomy  Im 1990 at [facility]  UROGYN RFERRAL DISCUSSED   3) type 2 Diabetes : STOPPED LANTUS  AFTER REDUCING DOSE DUE TO HYPOGLYCEMIA  LAST DOSE WAS A MONTH AGO.  FOLLOWING DR ERIC [facility] KETOGENIC DIET  which limits carbs to 20 grams daily intially,  then 50 grams thereafter . She has been restricting carbs to < 50 daily .  Not eating any fruits except 1/4 cup blueberries .  Has lost 15 lb since April and still losing .  I have downloaded and reviewed the data from patient's continuous blood glucose monitor  for  the period  of  Jun 28 to July 11  Patient's  sugars have been  IN RANGE  99     % OF THE TIME,   ABOVE RANGE    % of the time.  And  BELOW  RANGE  1  % OF THE TIME .  Medications currently used for glycemic control are   NONE         Outpatient Medications Prior to Visit  Medication Sig Dispense Refill   aspirin  81 MG  tablet Take 1 tablet (81 mg total) by mouth daily. 30 tablet 11   Cholecalciferol 125 MCG (5000 UT) capsule Take 5,000 Units by mouth daily.     Continuous Glucose Sensor (FREESTYLE LIBRE 3 SENSOR) MISC Use to check blood sugars continuously 6 each 3   Cyanocobalamin  (B-12) 5000 MCG CAPS Take 1 capsule by mouth daily.     fexofenadine (ALLEGRA) 180 MG tablet Take 180 mg by mouth daily.     levothyroxine  (SYNTHROID ) 88 MCG tablet TAKE 1 TABLET DAILY FOR HYPOTHYROIDISM 90 tablet 3   magnesium gluconate (MAGONATE) 500 MG tablet Take 500 mg by mouth 2 (two) times daily.     PARoxetine  (PAXIL ) 20 MG tablet TAKE 1 TABLET DAILY 90 tablet 3   TURMERIC PO Take by mouth daily.     Iron , Ferrous Sulfate , 325 (65 Fe) MG TABS Take 325 mg by mouth daily. 90 tablet 1   Barberry-Oreg Grape-Goldenseal (BERBERINE COMPLEX PO) Take 1 capsule by mouth 3 (three) times daily before meals.     insulin  glargine (LANTUS  SOLOSTAR) 100 UNIT/ML Solostar Pen INJECT 25 UNITS UNDER THE SKIN DAILY (Patient not taking: Reported on 08/24/2023) 15 mL 5   telmisartan  (MICARDIS ) 40 MG tablet TAKE 1 TABLET AT BEDTIME 90 tablet 3   No facility-administered medications prior to visit.  Review of Systems;  Patient denies headache, fevers, malaise, unintentional weight loss, skin rash, eye pain, sinus congestion and sinus pain, sore throat, dysphagia,  hemoptysis , cough, dyspnea, wheezing, chest pain, palpitations, orthopnea, edema, abdominal pain, nausea, melena, diarrhea, constipation, flank pain, dysuria, hematuria, urinary  Frequency, nocturia, numbness, tingling, seizures,  Focal weakness, Loss of consciousness,  Tremor, insomnia, depression, anxiety, and suicidal ideation.      Objective:  BP 124/76   Pulse 100   Ht 5' 4 (1.626 m)   Wt 245 lb 12.8 oz (111.5 kg)   SpO2 92%   BMI 42.19 kg/m   BP Readings from Last 3 Encounters:  08/24/23 124/76  06/01/23 134/67  05/25/23 122/66    Wt Readings from Last 3  Encounters:  08/24/23 245 lb 12.8 oz (111.5 kg)  06/01/23 260 lb 6.4 oz (118.1 kg)  05/25/23 257 lb 12.8 oz (116.9 kg)    Physical Exam Vitals reviewed.  Constitutional:      General: She is not in acute distress.    Appearance: Normal appearance. She is obese. She is not ill-appearing, toxic-appearing or diaphoretic.  HENT:     Head: Normocephalic.  Eyes:     General: No scleral icterus.       Right eye: No discharge.        Left eye: No discharge.     Conjunctiva/sclera: Conjunctivae normal.  Cardiovascular:     Rate and Rhythm: Normal rate and regular rhythm.     Heart sounds: Normal heart sounds.  Pulmonary:     Effort: Pulmonary effort is normal. No respiratory distress.     Breath sounds: Normal breath sounds.  Musculoskeletal:        General: Normal range of motion.  Skin:    General: Skin is warm and dry.  Neurological:     General: No focal deficit present.     Mental Status: She is alert and oriented to person, place, and time. Mental status is at baseline.  Psychiatric:        Mood and Affect: Mood normal.        Behavior: Behavior normal.        Thought Content: Thought content normal.        Judgment: Judgment normal.     Lab Results  Component Value Date   HGBA1C 6.0 08/24/2023   HGBA1C 5.9 05/25/2023   HGBA1C 6.2 02/24/2023    Lab Results  Component Value Date   CREATININE 0.80 08/24/2023   CREATININE 0.87 05/25/2023   CREATININE 0.77 02/24/2023    Lab Results  Component Value Date   WBC 3.7 (L) 05/19/2023   HGB 10.7 (L) 05/19/2023   HCT 32.9 (L) 05/19/2023   PLT 270.0 05/19/2023   GLUCOSE 97 08/24/2023   CHOL 242 (H) 08/24/2023   TRIG 270.0 (H) 08/24/2023   HDL 50.10 08/24/2023   LDLDIRECT 147.0 08/24/2023   LDLCALC 138 (H) 08/24/2023   ALT 13 08/24/2023   AST 12 08/24/2023   NA 139 08/24/2023   K 4.7 08/24/2023   CL 103 08/24/2023   CREATININE 0.80 08/24/2023   BUN 20 08/24/2023   CO2 26 08/24/2023   TSH 2.46 08/24/2023    HGBA1C 6.0 08/24/2023   MICROALBUR <0.7 08/24/2023    No results found.  Assessment & Plan:  .Essential hypertension -     Microalbumin / creatinine urine ratio -     Comprehensive metabolic panel with GFR  Type 2 diabetes mellitus with insulin  therapy (HCC) -  Microalbumin / creatinine urine ratio -     Comprehensive metabolic panel with GFR -     Hemoglobin A1c  Hypothyroidism due to acquired atrophy of thyroid  -     TSH  Hyperlipidemia with target LDL less than 70 Assessment & Plan: Managed with red yeast rice due to statin intolerance . Based on current lipid profile, the risk of clinically significant CAD is 25% over the next 10 years, using the Framingham risk calculator. . Does not want to resume lipitor due to concern about potential side effects .  Taking an aspirin .  Trial of Zetia advised   Lab Results  Component Value Date   CHOL 242 (H) 08/24/2023   HDL 50.10 08/24/2023   LDLCALC 138 (H) 08/24/2023   LDLDIRECT 147.0 08/24/2023   TRIG 270.0 (H) 08/24/2023   CHOLHDL 5 08/24/2023     Orders: -     Lipid panel -     LDL cholesterol, direct  Mixed stress and urge urinary incontinence Assessment & Plan: With potential etiology of neurogenic bladder given recent ER visit and collection of 900 ccs via Foley cath whe patient could not provide a clean catc specimen.  Referring to [facility].   Orders: -     Ambulatory referral to Urogynecology  Type 2 diabetes mellitus without complication, without long-term current use of insulin  (HCC) Assessment & Plan: MANAGING WITH LOW GLYCEMIC NDEX DIET ONLY,  WITH EXCELLENT WEIGHT LOSS AND CONTROL  BASED ON REVIEW OF DATA COLLECTED FROM HER CBG MONITOR.  DYSLIPIDEMIA NOTED ; WILL REPEAT LIPIDS CURRENTLY   Lab Results  Component Value Date   HGBA1C 5.9 05/25/2023   Lab Results  Component Value Date   MICROALBUR <0.7 08/24/2023   MICROALBUR 0.50 05/26/2013     Lab Results  Component Value Date   CHOL 218 (H)  05/25/2023   HDL 51.70 05/25/2023   LDLCALC 94 05/25/2023   LDLDIRECT 142.0 05/25/2023   TRIG 362.0 (H) 05/25/2023   CHOLHDL 4 05/25/2023      Iron  deficiency anemia, unspecified iron  deficiency anemia type Assessment & Plan: Etiology unclear given its persistence despite normalization of iron .  Eosinophilia is relative and mild.  SHE WAS REFERRED TOhematology and Dr Rennie discussed with her  the multiple etiologies of macrocytosis-including nutritional deficiency like B12 folic acid ; medications/including alcohol; and liver disease is. Also primary bone marrow disorders like myelodysplastic syndromes etc. are also included in the differential. Discussed with the patient that MDS is high in the differential.  Additional workup is pending   Lab Results  Component Value Date   WBC 3.7 (L) 05/19/2023   HGB 10.7 (L) 05/19/2023   HCT 32.9 (L) 05/19/2023   MCV 108.1 (H) 05/19/2023   PLT 270.0 05/19/2023   Lab Results  Component Value Date   IRON  114 05/19/2023   TIBC 373.8 05/19/2023   FERRITIN 13.8 05/19/2023      Other orders -     Iron  (Ferrous Sulfate ); Take 325 mg by mouth daily.  Dispense: 90 tablet; Refill: 1     I spent 34 minutes on the day of this face to face encounter reviewing patient's  most recent ER visit , prior relevant surgical and non surgical procedures, recent  labs and imaging studies, counseling on weight management,  reviewing the assessment and plan with patient, and post visit ordering and reviewing of  diagnostics and therapeutics with patient  .   Follow-up: Return in about 6 months (around 02/24/2024).   Brenda Floyd  Marylynn, MD

## 2023-08-24 NOTE — Patient Instructions (Addendum)
 I have made a referral to [facility] to have your bladder competence evaluated  Our referral team should  CONTACT YOU  when the appointment has been made.  If you do not hear from our office in 2 weeks,    Please call us  back.

## 2023-08-25 DIAGNOSIS — N3946 Mixed incontinence: Secondary | ICD-10-CM | POA: Insufficient documentation

## 2023-08-25 LAB — COMPREHENSIVE METABOLIC PANEL WITH GFR
ALT: 13 U/L (ref 0–35)
AST: 12 U/L (ref 0–37)
Albumin: 4.2 g/dL (ref 3.5–5.2)
Alkaline Phosphatase: 58 U/L (ref 39–117)
BUN: 20 mg/dL (ref 6–23)
CO2: 26 meq/L (ref 19–32)
Calcium: 9.6 mg/dL (ref 8.4–10.5)
Chloride: 103 meq/L (ref 96–112)
Creatinine, Ser: 0.8 mg/dL (ref 0.40–1.20)
GFR: 67.35 mL/min (ref >60.00)
Glucose, Bld: 97 mg/dL (ref 70–99)
Potassium: 4.7 meq/L (ref 3.5–5.1)
Sodium: 139 meq/L (ref 135–145)
Total Bilirubin: 0.4 mg/dL (ref 0.2–1.2)
Total Protein: 6.8 g/dL (ref 6.0–8.3)

## 2023-08-25 LAB — LDL CHOLESTEROL, DIRECT: Direct LDL: 147 mg/dL

## 2023-08-25 LAB — LIPID PANEL
Cholesterol: 242 mg/dL — ABNORMAL HIGH (ref 0–200)
HDL: 50.1 mg/dL (ref >39.00)
LDL Cholesterol: 138 mg/dL — ABNORMAL HIGH (ref 0–99)
NonHDL: 191.73
Total CHOL/HDL Ratio: 5
Triglycerides: 270 mg/dL — ABNORMAL HIGH (ref 0.0–149.0)
VLDL: 54 mg/dL — ABNORMAL HIGH (ref 0.0–40.0)

## 2023-08-25 LAB — HEMOGLOBIN A1C: Hgb A1c MFr Bld: 6 % (ref 4.6–6.5)

## 2023-08-25 LAB — TSH: TSH: 2.46 u[IU]/mL (ref 0.35–5.50)

## 2023-08-25 NOTE — Assessment & Plan Note (Signed)
 With potential etiology of neurogenic bladder given recent ER visit and collection of 900 ccs via Foley cath whe patient could not provide a clean catc specimen.  Referring to [facility].

## 2023-08-25 NOTE — Assessment & Plan Note (Signed)
 MANAGING WITH LOW GLYCEMIC NDEX DIET ONLY,  WITH EXCELLENT WEIGHT LOSS AND CONTROL  BASED ON REVIEW OF DATA COLLECTED FROM HER CBG MONITOR.  DYSLIPIDEMIA NOTED ; WILL REPEAT LIPIDS CURRENTLY   Lab Results  Component Value Date   HGBA1C 5.9 05/25/2023   Lab Results  Component Value Date   MICROALBUR <0.7 08/24/2023   MICROALBUR 0.50 05/26/2013     Lab Results  Component Value Date   CHOL 218 (H) 05/25/2023   HDL 51.70 05/25/2023   LDLCALC 94 05/25/2023   LDLDIRECT 142.0 05/25/2023   TRIG 362.0 (H) 05/25/2023   CHOLHDL 4 05/25/2023

## 2023-08-25 NOTE — Assessment & Plan Note (Addendum)
 Etiology unclear given its persistence despite normalization of iron .  Eosinophilia is relative and mild.  SHE WAS REFERRED TOhematology and Dr Rennie discussed with her  the multiple etiologies of macrocytosis-including nutritional deficiency like B12 folic acid ; medications/including alcohol; and liver disease is. Also primary bone marrow disorders like myelodysplastic syndromes etc. are also included in the differential. Discussed with the patient that MDS is high in the differential.  Additional workup is pending   Lab Results  Component Value Date   WBC 3.7 (L) 05/19/2023   HGB 10.7 (L) 05/19/2023   HCT 32.9 (L) 05/19/2023   MCV 108.1 (H) 05/19/2023   PLT 270.0 05/19/2023   Lab Results  Component Value Date   IRON  114 05/19/2023   TIBC 373.8 05/19/2023   FERRITIN 13.8 05/19/2023

## 2023-08-29 ENCOUNTER — Ambulatory Visit: Payer: Self-pay | Admitting: Internal Medicine

## 2023-08-29 DIAGNOSIS — E785 Hyperlipidemia, unspecified: Secondary | ICD-10-CM

## 2023-08-29 NOTE — Assessment & Plan Note (Signed)
 Managed with red yeast rice due to statin intolerance . Based on current lipid profile, the risk of clinically significant CAD is 25% over the next 10 years, using the Framingham risk calculator. . Does not want to resume lipitor due to concern about potential side effects .  Taking an aspirin .  Trial of Zetia advised   Lab Results  Component Value Date   CHOL 242 (H) 08/24/2023   HDL 50.10 08/24/2023   LDLCALC 138 (H) 08/24/2023   LDLDIRECT 147.0 08/24/2023   TRIG 270.0 (H) 08/24/2023   CHOLHDL 5 08/24/2023

## 2023-08-30 ENCOUNTER — Encounter: Payer: Self-pay | Admitting: Internal Medicine

## 2023-09-01 ENCOUNTER — Encounter: Payer: Self-pay | Admitting: Internal Medicine

## 2023-09-01 ENCOUNTER — Inpatient Hospital Stay (HOSPITAL_BASED_OUTPATIENT_CLINIC_OR_DEPARTMENT_OTHER): Admitting: Internal Medicine

## 2023-09-01 ENCOUNTER — Inpatient Hospital Stay: Attending: Internal Medicine

## 2023-09-01 VITALS — BP 124/92 | HR 86 | Temp 97.2°F | Resp 16 | Ht 64.0 in | Wt 246.1 lb

## 2023-09-01 DIAGNOSIS — Z803 Family history of malignant neoplasm of breast: Secondary | ICD-10-CM | POA: Insufficient documentation

## 2023-09-01 DIAGNOSIS — Z8261 Family history of arthritis: Secondary | ICD-10-CM | POA: Diagnosis not present

## 2023-09-01 DIAGNOSIS — D539 Nutritional anemia, unspecified: Secondary | ICD-10-CM

## 2023-09-01 DIAGNOSIS — Z8349 Family history of other endocrine, nutritional and metabolic diseases: Secondary | ICD-10-CM | POA: Insufficient documentation

## 2023-09-01 DIAGNOSIS — I10 Essential (primary) hypertension: Secondary | ICD-10-CM | POA: Diagnosis not present

## 2023-09-01 DIAGNOSIS — E538 Deficiency of other specified B group vitamins: Secondary | ICD-10-CM | POA: Insufficient documentation

## 2023-09-01 DIAGNOSIS — E119 Type 2 diabetes mellitus without complications: Secondary | ICD-10-CM | POA: Insufficient documentation

## 2023-09-01 DIAGNOSIS — G4733 Obstructive sleep apnea (adult) (pediatric): Secondary | ICD-10-CM | POA: Diagnosis not present

## 2023-09-01 DIAGNOSIS — Z800 Family history of malignant neoplasm of digestive organs: Secondary | ICD-10-CM | POA: Insufficient documentation

## 2023-09-01 DIAGNOSIS — Z79899 Other long term (current) drug therapy: Secondary | ICD-10-CM | POA: Diagnosis not present

## 2023-09-01 DIAGNOSIS — Z90710 Acquired absence of both cervix and uterus: Secondary | ICD-10-CM | POA: Diagnosis not present

## 2023-09-01 DIAGNOSIS — Z87891 Personal history of nicotine dependence: Secondary | ICD-10-CM | POA: Insufficient documentation

## 2023-09-01 DIAGNOSIS — Z833 Family history of diabetes mellitus: Secondary | ICD-10-CM | POA: Insufficient documentation

## 2023-09-01 DIAGNOSIS — Z83438 Family history of other disorder of lipoprotein metabolism and other lipidemia: Secondary | ICD-10-CM | POA: Diagnosis not present

## 2023-09-01 DIAGNOSIS — Z860100 Personal history of colon polyps, unspecified: Secondary | ICD-10-CM | POA: Diagnosis not present

## 2023-09-01 DIAGNOSIS — Z8249 Family history of ischemic heart disease and other diseases of the circulatory system: Secondary | ICD-10-CM | POA: Insufficient documentation

## 2023-09-01 DIAGNOSIS — Z7982 Long term (current) use of aspirin: Secondary | ICD-10-CM | POA: Diagnosis not present

## 2023-09-01 DIAGNOSIS — R634 Abnormal weight loss: Secondary | ICD-10-CM | POA: Insufficient documentation

## 2023-09-01 DIAGNOSIS — Z806 Family history of leukemia: Secondary | ICD-10-CM | POA: Diagnosis not present

## 2023-09-01 LAB — CBC WITH DIFFERENTIAL (CANCER CENTER ONLY)
Abs Immature Granulocytes: 0.03 K/uL (ref 0.00–0.07)
Basophils Absolute: 0 K/uL (ref 0.0–0.1)
Basophils Relative: 1 %
Eosinophils Absolute: 0.3 K/uL (ref 0.0–0.5)
Eosinophils Relative: 7 %
HCT: 34.4 % — ABNORMAL LOW (ref 36.0–46.0)
Hemoglobin: 11.2 g/dL — ABNORMAL LOW (ref 12.0–15.0)
Immature Granulocytes: 1 %
Lymphocytes Relative: 26 %
Lymphs Abs: 1 K/uL (ref 0.7–4.0)
MCH: 36.8 pg — ABNORMAL HIGH (ref 26.0–34.0)
MCHC: 32.6 g/dL (ref 30.0–36.0)
MCV: 113.2 fL — ABNORMAL HIGH (ref 80.0–100.0)
Monocytes Absolute: 0.3 K/uL (ref 0.1–1.0)
Monocytes Relative: 8 %
Neutro Abs: 2.2 K/uL (ref 1.7–7.7)
Neutrophils Relative %: 57 %
Platelet Count: 245 K/uL (ref 150–400)
RBC: 3.04 MIL/uL — ABNORMAL LOW (ref 3.87–5.11)
RDW: 15.7 % — ABNORMAL HIGH (ref 11.5–15.5)
WBC Count: 3.8 K/uL — ABNORMAL LOW (ref 4.0–10.5)
nRBC: 0 % (ref 0.0–0.2)

## 2023-09-01 LAB — RETIC PANEL
Immature Retic Fract: 25.2 % — ABNORMAL HIGH (ref 2.3–15.9)
RBC.: 3.05 MIL/uL — ABNORMAL LOW (ref 3.87–5.11)
Retic Count, Absolute: 74.7 K/uL (ref 19.0–186.0)
Retic Ct Pct: 2.5 % (ref 0.4–3.1)
Reticulocyte Hemoglobin: 39.1 pg (ref >27.9)

## 2023-09-01 LAB — VITAMIN B12: Vitamin B-12: 497 pg/mL (ref 180–914)

## 2023-09-01 LAB — FOLATE: Folate: >40 ng/mL (ref >5.9)

## 2023-09-01 LAB — CMP (CANCER CENTER ONLY)
ALT: 16 U/L (ref 0–44)
AST: 13 U/L — ABNORMAL LOW (ref 15–41)
Albumin: 3.9 g/dL (ref 3.5–5.0)
Alkaline Phosphatase: 54 U/L (ref 38–126)
Anion gap: 8 (ref 5–15)
BUN: 19 mg/dL (ref 8–23)
CO2: 23 mmol/L (ref 22–32)
Calcium: 9.1 mg/dL (ref 8.9–10.3)
Chloride: 104 mmol/L (ref 98–111)
Creatinine: 0.82 mg/dL (ref 0.44–1.00)
GFR, Estimated: >60 mL/min (ref >60)
Glucose, Bld: 98 mg/dL (ref 70–99)
Potassium: 3.8 mmol/L (ref 3.5–5.1)
Sodium: 135 mmol/L (ref 135–145)
Total Bilirubin: 0.5 mg/dL (ref 0.0–1.2)
Total Protein: 6.5 g/dL (ref 6.5–8.1)

## 2023-09-01 LAB — LACTATE DEHYDROGENASE: LDH: 118 U/L (ref 98–192)

## 2023-09-01 NOTE — Assessment & Plan Note (Addendum)
#   Macrocytic anemia-  : Hemoglobin: 10 [since 2023] MCV -102- normal white count/platelets.  Fairly asymptomatic. # Patient currently on B12 and folic acid .  No obvious evidence of hemolysis patient does not drink any alcohol.  No history of any liver disease.  # Discussed with the patient that MDS is high in the differential-however will require a bone marrow biopsy to diagnose.  Since clinical stable I think is reasonable to hold off of bone marrow biopsy.  # B12-deficiency- on SL B12 daily.   # DM-patient currently on keto/low-carb diet.  She has been able to come OFF insulin -congratulated patient on losing weight.  # OSA on CPAP- stable.   # DISPOSITION: # follow up in 4 months-   CBC/ CMP; LDH; retic count; iron  studies; ferritin; b12 -  Dr.B

## 2023-09-01 NOTE — Progress Notes (Signed)
 Fatigue/weakness: NO Dyspena: NO Light headedness: NO  Blood in stool: NO   Went to [facility] since last visit due to a fall. She fell getting out of bed while vomiting, no injuries.  She would like more info about her condition you treat her for.

## 2023-09-01 NOTE — Assessment & Plan Note (Signed)
 She has declined all statin as well as Zetia

## 2023-09-01 NOTE — Progress Notes (Signed)
 [facility] CONSULT NOTE  Patient Care Team: Marylynn Verneita CROME, MD as PCP - General (Internal Medicine) Myrna Adine Anes, MD as Consulting Physician (Ophthalmology) Rennie Cindy SAUNDERS, MD as Consulting Physician (Oncology)  CHIEF COMPLAINTS/PURPOSE OF CONSULTATION: ANEMIA   HEMATOLOGY HISTORY  # ANEMIA[Hb; MCV-platelets- WBC; Iron  sat; ferritin;  GFR- CT/US ; EGD/colonoscopy-  HISTORY OF PRESENTING ILLNESS: Patient ambulating-independently  Alone    Brenda Floyd 83 y.o.  female pleasant patient macrocytic anemia-of unclear etiology-possible low-grade MDS [no bone marrow biopsy] is here for follow-up.  Patient had a recent fall. Went to [facility] since last visit due to a fall. She fell getting out of bed while vomiting, no injuries.  Currently GI upset has resolved.   Patient has been on keto diet and also has improvement of energy levels.   Has lost weight.  She has come off insulin .  She also come off of blood pressure medication.  Review of Systems  Constitutional:  Negative for chills, diaphoresis, fever, malaise/fatigue and weight loss.  HENT:  Negative for nosebleeds and sore throat.   Eyes:  Negative for double vision.  Respiratory:  Negative for cough, hemoptysis, sputum production, shortness of breath and wheezing.   Cardiovascular:  Negative for chest pain, palpitations, orthopnea and leg swelling.  Gastrointestinal:  Negative for abdominal pain, blood in stool, constipation, diarrhea, heartburn, melena, nausea and vomiting.  Genitourinary:  Negative for dysuria, frequency and urgency.  Musculoskeletal:  Negative for back pain and joint pain.  Skin: Negative.  Negative for itching and rash.  Neurological:  Negative for dizziness, tingling, focal weakness, weakness and headaches.  Endo/Heme/Allergies:  Does not bruise/bleed easily.  Psychiatric/Behavioral:  Negative for depression. The patient is not nervous/anxious and does not have insomnia.       MEDICAL HISTORY:  Past Medical History:  Diagnosis Date   Allergy    Anemia    Arthritis    neck, knees, left shoulder   Blunt head trauma 02/24/2023   Head CT report from Dec 23  [facility] [place]     The gray-white matter differentiation is intact. There is no evidence of acute infarct, hemorrhage, mass or mass effect. There is a prominence of the ventricles and sulci consistent with diffuse cortical volume loss. There are periventricular low-attenuation white matter changes, likely small vessel ischemic disease. The visualized paranasal sinu   Cancer (HCC)    skin ca   Cataract    Colon polyps 2012   [facility], followup due 2015   Diabetes mellitus    Diverticulosis of sigmoid colon 08/2010   by colonoscopy   Dyspnea    Fracture closed, femur, shaft (HCC) remote   s/p screws and rod repair   Hyperlipidemia    Hypertension    Osteoporosis    Sleep apnea    Thyroid  disease     SURGICAL HISTORY: Past Surgical History:  Procedure Laterality Date   ABDOMINAL HYSTERECTOMY     BREAST CYST ASPIRATION Left    neg   CATARACT EXTRACTION W/PHACO Right 04/06/2017   Procedure: CATARACT EXTRACTION PHACO AND INTRAOCULAR LENS PLACEMENT (IOC) RIGHT DIABETIC;  Surgeon: Myrna Adine Anes, MD;  Location: [facility];  Service: Ophthalmology;  Laterality: Right;   CATARACT EXTRACTION W/PHACO Left 05/04/2017   Procedure: CATARACT EXTRACTION PHACO AND INTRAOCULAR LENS PLACEMENT (IOC) LEFT DIABETIC;  Surgeon: Myrna Adine Anes, MD;  Location: [facility];  Service: Ophthalmology;  Laterality: Left;  DIABETIC   EYE SURGERY     FRACTURE SURGERY  JOINT REPLACEMENT     bilateral knee, 2011    SOCIAL HISTORY: Social History   Socioeconomic History   Marital status: Single    Spouse name: Not on file   Number of children: 0   Years of education: Not on file   Highest education level: Master's degree (e.g., MA, MS, MEng, MEd, MSW, MBA)  Occupational History    Occupation: retired  Tobacco Use   Smoking status: Former    Current packs/day: 0.00    Average packs/day: 0.8 packs/day for 35.7 years (26.8 ttl pk-yrs)    Types: Cigarettes    Start date: 53    Quit date: 10/30/1990    Years since quitting: 32.8   Smokeless tobacco: Never  Vaping Use   Vaping status: Never Used  Substance and Sexual Activity   Alcohol use: No   Drug use: No   Sexual activity: Never  Other Topics Concern   Not on file  Social History Narrative   Never married, no children   Social Drivers of Corporate investment banker Strain: Low Risk  (08/21/2023)   Overall Financial Resource Strain (CARDIA)    Difficulty of Paying Living Expenses: Not hard at all  Food Insecurity: No Food Insecurity (08/21/2023)   Hunger Vital Sign    Worried About Running Out of Food in the Last Year: Never true    Ran Out of Food in the Last Year: Never true  Transportation Needs: No Transportation Needs (08/21/2023)   PRAPARE - Administrator, Civil Service (Medical): No    Lack of Transportation (Non-Medical): No  Physical Activity: Inactive (08/21/2023)   Exercise Vital Sign    Days of Exercise per Week: 0 days    Minutes of Exercise per Session: Not on file  Stress: No Stress Concern Present (08/21/2023)   [employer] of Occupational Health - Occupational Stress Questionnaire    Feeling of Stress: Not at all  Social Connections: Socially Isolated (08/21/2023)   Social Connection and Isolation Panel    Frequency of Communication with Friends and Family: Never    Frequency of Social Gatherings with Friends and Family: Never    Attends Religious Services: Never    Database administrator or Organizations: No    Attends Engineer, structural: Not on file    Marital Status: Never married  Intimate Partner Violence: Not At Risk (06/01/2023)   Humiliation, Afraid, Rape, and Kick questionnaire    Fear of Current or Ex-Partner: No    Emotionally Abused: No     Physically Abused: No    Sexually Abused: No    FAMILY HISTORY: Family History  Problem Relation Age of Onset   Cancer Mother        breast cancer and colon ca   Breast cancer Mother 43   Colon cancer Mother    Cancer Father        leukemia   Diabetes Sister    Hypertension Sister    Breast cancer Sister 57   Arthritis Sister    Cancer Sister    Hyperlipidemia Sister    Obesity Sister    Breast cancer Maternal Aunt    Breast cancer Cousin        maternal 1st cousin    ALLERGIES:  is allergic to crestor  [rosuvastatin ].  MEDICATIONS:  Current Outpatient Medications  Medication Sig Dispense Refill   aspirin  81 MG tablet Take 1 tablet (81 mg total) by mouth daily. 30 tablet 11  Cholecalciferol 125 MCG (5000 UT) capsule Take 5,000 Units by mouth daily.     Continuous Glucose Sensor (FREESTYLE LIBRE 3 SENSOR) MISC Use to check blood sugars continuously 6 each 3   Cyanocobalamin  (B-12) 5000 MCG CAPS Take 1 capsule by mouth daily.     fexofenadine (ALLEGRA) 180 MG tablet Take 180 mg by mouth daily.     Iron , Ferrous Sulfate , 325 (65 Fe) MG TABS Take 325 mg by mouth daily. 90 tablet 1   levothyroxine  (SYNTHROID ) 88 MCG tablet TAKE 1 TABLET DAILY FOR HYPOTHYROIDISM 90 tablet 3   magnesium gluconate (MAGONATE) 500 MG tablet Take 500 mg by mouth 2 (two) times daily.     PARoxetine  (PAXIL ) 20 MG tablet TAKE 1 TABLET DAILY 90 tablet 3   TURMERIC PO Take by mouth daily.     No current facility-administered medications for this visit.     SABRA  PHYSICAL EXAMINATION:   Vitals:   09/01/23 1445  BP: (!) 124/92  Pulse: 86  Resp: 16  Temp: (!) 97.2 F (36.2 C)  SpO2: 98%   Filed Weights   09/01/23 1445  Weight: 246 lb 1.6 oz (111.6 kg)    Physical Exam Vitals and nursing note reviewed.  HENT:     Head: Normocephalic and atraumatic.     Mouth/Throat:     Pharynx: Oropharynx is clear.  Eyes:     Extraocular Movements: Extraocular movements intact.     Pupils: Pupils are  equal, round, and reactive to light.  Cardiovascular:     Rate and Rhythm: Normal rate and regular rhythm.  Pulmonary:     Comments: Decreased breath sounds bilaterally.  Abdominal:     Palpations: Abdomen is soft.  Musculoskeletal:        General: Normal range of motion.     Cervical back: Normal range of motion.  Skin:    General: Skin is warm.  Neurological:     General: No focal deficit present.     Mental Status: She is alert and oriented to person, place, and time.  Psychiatric:        Behavior: Behavior normal.        Judgment: Judgment normal.      LABORATORY DATA:  I have reviewed the data as listed Lab Results  Component Value Date   WBC 3.8 (L) 09/01/2023   HGB 11.2 (L) 09/01/2023   HCT 34.4 (L) 09/01/2023   MCV 113.2 (H) 09/01/2023   PLT 245 09/01/2023   Recent Labs    05/25/23 1443 08/24/23 1424 09/01/23 1510  NA 141 139 135  K 4.8 4.7 3.8  CL 104 103 104  CO2 28 26 23   GLUCOSE 105* 97 98  BUN 17 20 19   CREATININE 0.87 0.80 0.82  CALCIUM  9.7 9.6 9.1  GFRNONAA  --   --  >60  PROT 6.7  6.7 6.8 6.5  ALBUMIN 4.1 4.2 3.9  AST 11 12 13*  ALT 12 13 16   ALKPHOS 73 58 54  BILITOT 0.3 0.4 0.5     No results found.  ASSESSMENT & PLAN:   Macrocytic anemia # Macrocytic anemia-  : Hemoglobin: 10 [since 2023] MCV -102- normal white count/platelets.  Fairly asymptomatic. # Patient currently on B12 and folic acid .  No obvious evidence of hemolysis patient does not drink any alcohol.  No history of any liver disease.  # Discussed with the patient that MDS is high in the differential-however will require a bone marrow biopsy to diagnose.  Since clinical stable I think is reasonable to hold off of bone marrow biopsy.  # B12-deficiency- on SL B12 daily.   # DM-patient currently on keto/low-carb diet.  She has been able to come OFF insulin -congratulated patient on losing weight.  # OSA on CPAP- stable.   # DISPOSITION: # follow up in 4 months-   CBC/ CMP;  LDH; retic count; iron  studies; ferritin; b12 -  Dr.B    All questions were answered. The patient knows to call the clinic with any problems, questions or concerns.    Cindy JONELLE Joe, MD 09/01/2023 4:16 PM

## 2023-09-02 LAB — KAPPA/LAMBDA LIGHT CHAINS
Kappa free light chain: 29.9 mg/L — ABNORMAL HIGH (ref 3.3–19.4)
Kappa, lambda light chain ratio: 1.18 (ref 0.26–1.65)
Lambda free light chains: 25.3 mg/L (ref 5.7–26.3)

## 2023-09-02 LAB — ERYTHROPOIETIN: Erythropoietin: 117.9 m[IU]/mL — ABNORMAL HIGH (ref 2.6–18.5)

## 2023-09-03 LAB — MULTIPLE MYELOMA PANEL, SERUM
Albumin SerPl Elph-Mcnc: 3.6 g/dL (ref 2.9–4.4)
Albumin/Glob SerPl: 1.4 (ref 0.7–1.7)
Alpha 1: 0.2 g/dL (ref 0.0–0.4)
Alpha2 Glob SerPl Elph-Mcnc: 0.7 g/dL (ref 0.4–1.0)
B-Globulin SerPl Elph-Mcnc: 1 g/dL (ref 0.7–1.3)
Gamma Glob SerPl Elph-Mcnc: 0.8 g/dL (ref 0.4–1.8)
Globulin, Total: 2.7 g/dL (ref 2.2–3.9)
IgA: 188 mg/dL (ref 64–422)
IgG (Immunoglobin G), Serum: 789 mg/dL (ref 586–1602)
IgM (Immunoglobulin M), Srm: 99 mg/dL (ref 26–217)
Total Protein ELP: 6.3 g/dL (ref 6.0–8.5)

## 2023-09-03 LAB — HAPTOGLOBIN: Haptoglobin: 104 mg/dL (ref 41–333)

## 2023-10-02 ENCOUNTER — Other Ambulatory Visit: Payer: Self-pay | Admitting: Internal Medicine

## 2023-11-04 ENCOUNTER — Ambulatory Visit
Admission: EM | Admit: 2023-11-04 | Discharge: 2023-11-04 | Disposition: A | Discharge status: Home / Self Care | Attending: Emergency Medicine | Admitting: Emergency Medicine

## 2023-11-04 ENCOUNTER — Encounter: Payer: Self-pay | Admitting: Internal Medicine

## 2023-11-04 ENCOUNTER — Ambulatory Visit: Payer: Self-pay

## 2023-11-04 DIAGNOSIS — N390 Urinary tract infection, site not specified: Secondary | ICD-10-CM | POA: Insufficient documentation

## 2023-11-04 MED ORDER — PHENAZOPYRIDINE HCL 200 MG PO TABS: 200.0000 mg | ORAL_TABLET | Freq: Three times a day (TID) | ORAL | 0 refills | Status: DC

## 2023-11-04 MED ORDER — CEFDINIR 300 MG PO CAPS: 300.0000 mg | ORAL_CAPSULE | Freq: Two times a day (BID) | ORAL | 0 refills | Status: AC

## 2023-11-04 NOTE — Telephone Encounter (Signed)
 Attempted to call Patient -no answer and voicemail has not been set up

## 2023-11-04 NOTE — Telephone Encounter (Signed)
 FYI Only or Action Required?: FYI only for provider.  Patient was last seen in primary care on 08/24/2023 by Marylynn Verneita CROME, MD.  Called Nurse Triage reporting Dysuria.  Symptoms began several days ago.  Interventions attempted: Nothing.  Symptoms are: gradually worsening.  Triage Disposition: See Physician Within 24 Hours- pt advised to go to urgent care as she did not want to schedule outside of her office. Pt agreed.   Patient/caregiver understands and will follow disposition?: Yes   Copied from CRM [id number]. Topic: Clinical - Red Word Triage >> Nov 04, 2023  1:01 PM Harlene ORN wrote: Red Word that prompted transfer to Nurse Triage: UTI for three days Bladder spasm Frequent urination Reason for Disposition  Urinating more frequently than usual (i.e., frequency) OR new-onset of the feeling of an urgent need to urinate (i.e., urgency)  Answer Assessment - Initial Assessment Questions Pt was seen at [facility] and they did in an out cath and stated that her urine came back infected. They gave her antibiotics in the ER but did not send her home with any. She states she didn't start having symptoms until 3 days ago. She began to have painful urination, increased frequency, abdominal pain.     1. SYMPTOM: What's the main symptom you're concerned about? (e.g., frequency, incontinence)     Painful urination 2. ONSET: When did the  pain and frequency  start?     3 days 3. PAIN: Is there any pain? If Yes, ask: How bad is it? (Scale: 1-10; mild, moderate, severe)     5/10 4. CAUSE: What do you think is causing the symptoms?     Unresolved uti 5. OTHER SYMPTOMS: Do you have any other symptoms? (e.g., blood in urine, fever, flank pain, pain with urination)     No fever, no blood, lower abdominal pain  Protocols used: Urinary Symptoms-A-AH

## 2023-11-05 ENCOUNTER — Encounter: Payer: Self-pay | Admitting: Internal Medicine

## 2023-11-05 NOTE — Telephone Encounter (Signed)
Pt was seen at [facility] yesterday.

## 2023-11-06 ENCOUNTER — Encounter: Payer: Self-pay | Admitting: Internal Medicine

## 2023-11-06 DIAGNOSIS — N3000 Acute cystitis without hematuria: Secondary | ICD-10-CM

## 2023-11-06 LAB — URINE CULTURE: Culture: >=100000 — AB

## 2023-11-08 ENCOUNTER — Ambulatory Visit (HOSPITAL_COMMUNITY): Payer: Self-pay

## 2023-11-08 DIAGNOSIS — N390 Urinary tract infection, site not specified: Secondary | ICD-10-CM | POA: Insufficient documentation

## 2023-11-08 NOTE — Assessment & Plan Note (Signed)
 Proteus mirabilis, treated with Cefdinir x 7 days

## 2023-11-24 ENCOUNTER — Encounter: Payer: Self-pay | Admitting: Internal Medicine

## 2023-11-24 DIAGNOSIS — M25511 Pain in right shoulder: Secondary | ICD-10-CM | POA: Diagnosis not present

## 2023-11-24 DIAGNOSIS — E669 Obesity, unspecified: Secondary | ICD-10-CM | POA: Diagnosis not present

## 2023-11-24 DIAGNOSIS — E119 Type 2 diabetes mellitus without complications: Secondary | ICD-10-CM | POA: Diagnosis not present

## 2023-12-13 DIAGNOSIS — E119 Type 2 diabetes mellitus without complications: Secondary | ICD-10-CM | POA: Diagnosis not present

## 2023-12-13 DIAGNOSIS — E785 Hyperlipidemia, unspecified: Secondary | ICD-10-CM | POA: Diagnosis not present

## 2023-12-13 DIAGNOSIS — S6992XA Unspecified injury of left wrist, hand and finger(s), initial encounter: Secondary | ICD-10-CM | POA: Diagnosis not present

## 2023-12-13 DIAGNOSIS — E039 Hypothyroidism, unspecified: Secondary | ICD-10-CM | POA: Diagnosis not present

## 2023-12-13 DIAGNOSIS — M85842 Other specified disorders of bone density and structure, left hand: Secondary | ICD-10-CM | POA: Diagnosis not present

## 2023-12-13 DIAGNOSIS — Z79890 Hormone replacement therapy (postmenopausal): Secondary | ICD-10-CM | POA: Diagnosis not present

## 2023-12-13 DIAGNOSIS — Z87891 Personal history of nicotine dependence: Secondary | ICD-10-CM | POA: Diagnosis not present

## 2023-12-13 DIAGNOSIS — S61412A Laceration without foreign body of left hand, initial encounter: Secondary | ICD-10-CM | POA: Diagnosis not present

## 2023-12-13 DIAGNOSIS — Z888 Allergy status to other drugs, medicaments and biological substances status: Secondary | ICD-10-CM | POA: Diagnosis not present

## 2023-12-13 DIAGNOSIS — I10 Essential (primary) hypertension: Secondary | ICD-10-CM | POA: Diagnosis not present

## 2023-12-21 ENCOUNTER — Ambulatory Visit
Admission: RE | Admit: 2023-12-21 | Discharge: 2023-12-21 | Disposition: A | Discharge status: Home / Self Care | Attending: Emergency Medicine | Admitting: Emergency Medicine

## 2023-12-21 VITALS — BP 140/98 | HR 79 | Temp 98.0°F | Wt 230.8 lb

## 2023-12-21 DIAGNOSIS — S61412A Laceration without foreign body of left hand, initial encounter: Secondary | ICD-10-CM | POA: Diagnosis not present

## 2023-12-21 NOTE — ED Triage Notes (Signed)
 Pt c/o stitches removal on the left hand  Pt states that she was leashing her dog when he took off, knocking her hand against a birdcage  Pt was seen at [facility] [place] and had sutures placed on 11.3.25

## 2023-12-21 NOTE — Discharge Instructions (Signed)
 Continue keeping this clean with soap and water.  We will see you in 3 days for reevaluation.  Come back sooner if it starts to look infected.

## 2023-12-21 NOTE — ED Provider Notes (Signed)
HPI  SUBJECTIVE:  Brenda Floyd is a 83 y.o. female who presents for possible suture removal.  Was seen in the emergency department on 11/3 for a laceration to the dorsum of her left hand which was repaired with 10 interrupted sutures.  She was sent home with 5 days of Keflex and sutures were recommended to come out in 7 to 10 days.  She states the laceration is healing well.  No erythema, purulent drainage.  She finished the Keflex.  She has been doing daily wound care with soap and water, keeping it covered.  No aggravating or alleviating factors. She has a past medical history of hypertension, hyperlipidemia, diabetes, anemia.  PCP: [facility]  Past Medical History:  Diagnosis Date   Allergy    Anemia    Arthritis    neck, knees, left shoulder   Blunt head trauma 02/24/2023   Head CT report from Dec 23  [facility] [place]     The gray-white matter differentiation is intact. There is no evidence of acute infarct, hemorrhage, mass or mass effect. There is a prominence of the ventricles and sulci consistent with diffuse cortical volume loss. There are periventricular low-attenuation white matter changes, likely small vessel ischemic disease. The visualized paranasal sinu   Cancer (HCC)    skin ca   Cataract    Colon polyps 2012   [facility], followup due 2015   Diabetes mellitus    Diverticulosis of sigmoid colon 08/2010   by colonoscopy   Dyspnea    Fracture closed, femur, shaft (HCC) remote   s/p screws and rod repair   Hyperlipidemia    Hypertension    Osteoporosis    Sleep apnea    Thyroid  disease     Past Surgical History:  Procedure Laterality Date   ABDOMINAL HYSTERECTOMY     BREAST CYST ASPIRATION Left    neg   CATARACT EXTRACTION W/PHACO Right 04/06/2017   Procedure: CATARACT EXTRACTION PHACO AND INTRAOCULAR LENS PLACEMENT (IOC) RIGHT DIABETIC;  Surgeon: Myrna Adine Anes, MD;  Location: [facility];  Service: Ophthalmology;  Laterality: Right;    CATARACT EXTRACTION W/PHACO Left 05/04/2017   Procedure: CATARACT EXTRACTION PHACO AND INTRAOCULAR LENS PLACEMENT (IOC) LEFT DIABETIC;  Surgeon: Myrna Adine Anes, MD;  Location: [facility];  Service: Ophthalmology;  Laterality: Left;  DIABETIC   EYE SURGERY     FRACTURE SURGERY     JOINT REPLACEMENT     bilateral knee, 2011    Family History  Problem Relation Age of Onset   Cancer Mother        breast cancer and colon ca   Breast cancer Mother 30   Colon cancer Mother    Cancer Father        leukemia   Diabetes Sister    Hypertension Sister    Breast cancer Sister 18   Arthritis Sister    Cancer Sister    Hyperlipidemia Sister    Obesity Sister    Breast cancer Maternal Aunt    Breast cancer Cousin        maternal 1st cousin    Social History   Tobacco Use   Smoking status: Never   Smokeless tobacco: Never  Vaping Use   Vaping status: Never Used  Substance Use Topics   Alcohol use: Never   Drug use: Never    No current facility-administered medications for this encounter.  Current Outpatient Medications:    aspirin  81 MG tablet, Take 1 tablet (81  mg total) by mouth daily., Disp: 30 tablet, Rfl: 11   Cholecalciferol 125 MCG (5000 UT) capsule, Take 5,000 Units by mouth daily., Disp: , Rfl:    Continuous Glucose Sensor (FREESTYLE LIBRE 3 SENSOR) MISC, Use to check blood sugars continuously, Disp: 6 each, Rfl: 3   Cyanocobalamin  (B-12) 5000 MCG CAPS, Take 1 capsule by mouth daily., Disp: , Rfl:    fexofenadine (ALLEGRA) 180 MG tablet, Take 180 mg by mouth daily., Disp: , Rfl:    Iron , Ferrous Sulfate , 325 (65 Fe) MG TABS, Take 325 mg by mouth daily., Disp: 90 tablet, Rfl: 1   levothyroxine  (SYNTHROID ) 88 MCG tablet, TAKE 1 TABLET DAILY FOR HYPOTHYROIDISM, Disp: 90 tablet, Rfl: 3   magnesium gluconate (MAGONATE) 500 MG tablet, Take 500 mg by mouth 2 (two) times daily., Disp: , Rfl:    PARoxetine  (PAXIL ) 20 MG tablet, TAKE 1 TABLET DAILY, Disp: 90 tablet, Rfl:  3   PARoxetine  (PAXIL ) 20 MG tablet, Take 20 mg by mouth daily., Disp: , Rfl:    phenazopyridine (PYRIDIUM) 200 MG tablet, Take 1 tablet (200 mg total) by mouth 3 (three) times daily., Disp: 6 tablet, Rfl: 0   SYNTHROID  88 MCG tablet, Take 88 mcg by mouth daily., Disp: , Rfl:    TURMERIC PO, Take by mouth daily., Disp: , Rfl:   Allergies  Allergen Reactions   Crestor  [Rosuvastatin ]     Myalgias and heart burn      ROS  As noted in HPI.   Physical Exam  BP (!) 140/98 (BP Location: Left Arm)   Pulse 79   Temp 98 F (36.7 C) (Oral)   Wt 104.7 kg   SpO2 98%   BMI 39.62 kg/m   Constitutional: Well developed, well nourished, no acute distress Eyes:  EOMI, conjunctiva normal bilaterally HENT: Normocephalic, atraumatic,mucus membranes moist Respiratory: Normal inspiratory effort Cardiovascular: Normal rate GI: nondistended skin: Healing laceration with 10 sutures intact on the dorsum of the left hand with no surrounding erythema, edema, expressible purulent drainage.   Musculoskeletal: no deformities Neurologic: Alert & oriented x 3, no focal neuro deficits Psychiatric: Speech and behavior appropriate   ED Course   Medications - No data to display  No orders of the defined types were placed in this encounter.   No results found for this or any previous visit (from the past 24 hours). No results found.  ED Clinical Impression  1. Laceration of left hand without foreign body, initial encounter      ED Assessment/Plan     ER records reviewed.  As noted in HPI.  It has been only 8 days since she had the sutures placed, I do not think that they are quite ready to come out.  I explained to the patient that I believe it is better to leave those in for a little while longer since she is a diabetic and elderly, thus will have a prolonged healing time.  Will bring her back in 3 days for reevaluation and suture removal at that time.  In the meantime, continue wound care  with soap and water.  Return sooner for any signs of infection.  Discussed MDM, treatment plan, and plan for follow-up with patient.  patient agrees with plan.   No orders of the defined types were placed in this encounter.     *This clinic note was created using Dragon dictation software. Therefore, there may be occasional mistakes despite careful proofreading.  ?    Van Knee, MD 12/21/23  1436  

## 2023-12-26 ENCOUNTER — Ambulatory Visit
Admission: RE | Admit: 2023-12-26 | Discharge: 2023-12-26 | Disposition: A | Discharge status: Home / Self Care | Source: Ambulatory Visit | Attending: Emergency Medicine | Admitting: Emergency Medicine

## 2023-12-26 VITALS — BP 137/85 | HR 85 | Temp 97.8°F | Resp 16 | Ht 64.0 in | Wt 230.8 lb

## 2023-12-26 DIAGNOSIS — Z4802 Encounter for removal of sutures: Secondary | ICD-10-CM

## 2023-12-26 DIAGNOSIS — Z5189 Encounter for other specified aftercare: Secondary | ICD-10-CM

## 2023-12-26 NOTE — ED Provider Notes (Signed)
 [facility]    CSN: [id number] Arrival date & time: 12/26/23  1316      History   Chief Complaint Chief Complaint  Patient presents with   Suture / Staple Removal    Appointment    HPI KATHLYNN SWOFFORD is a 83 y.o. female.   83 year old female, Shawnie Nicole, presents to urgent care for evaluation of left hand injury, suture removal.  Had laceration repair 12/13/2023 at [facility] ER.  Patient states the area is healing well no complaints or problems at this time.  No purulent drainage  The history is provided by the patient. No language interpreter was used.    Past Medical History:  Diagnosis Date   Allergy    Anemia    Arthritis    neck, knees, left shoulder   Blunt head trauma 02/24/2023   Head CT report from Dec 23  [facility] [place]     The gray-white matter differentiation is intact. There is no evidence of acute infarct, hemorrhage, mass or mass effect. There is a prominence of the ventricles and sulci consistent with diffuse cortical volume loss. There are periventricular low-attenuation white matter changes, likely small vessel ischemic disease. The visualized paranasal sinu   Cancer (HCC)    skin ca   Cataract    Colon polyps 2012   [facility]er, followup due 2015   Diabetes mellitus    Diverticulosis of sigmoid colon 08/2010   by colonoscopy   Dyspnea    Fracture closed, femur, shaft (HCC) remote   s/p screws and rod repair   Hyperlipidemia    Hypertension    Osteoporosis    Sleep apnea    Thyroid  disease     Patient Active Problem List   Diagnosis Date Noted   Visit for suture removal 12/26/2023   Visit for wound check 12/26/2023   UTI (urinary tract infection) 11/08/2023   Mixed stress and urge urinary incontinence 08/25/2023   Macrocytic anemia 06/01/2023   Abnormal findings in stool 09/16/2022   Anemia, iron  deficiency 08/05/2021   Polyarthralgia 08/13/2019   Gum disease 05/25/2019   OSA (obstructive sleep apnea) 10/12/2017    Cervical radiculopathy at C5 09/18/2017   Myalgia due to statin 05/08/2017   S/P bilateral cataract extraction 05/08/2017   Diabetes mellitus type 2, uncomplicated (HCC) 09/29/2016   Diarrhea 12/03/2015   Chronic left shoulder pain 12/02/2015   Family history of breast cancer 08/31/2015   Medication adverse effect 04/30/2015   Encounter for preventive health examination 01/31/2015   Osteopenia determined by x-ray 08/19/2014   Pain in joint, ankle and foot 07/28/2014   Lumbago 07/26/2014   Hypothyroidism due to acquired atrophy of thyroid  04/28/2014   Depression with anxiety 04/28/2014   Vitamin B12 deficiency (non anemic) 10/22/2013   S/P TKR (total knee replacement) 02/25/2013   History of colonic polyps 11/15/2012   Sacroiliac joint disease 02/01/2011   Hyperlipidemia with target LDL less than 70 11/01/2010   Essential hypertension    Morbid obesity (HCC)    Diverticulosis of sigmoid colon     Past Surgical History:  Procedure Laterality Date   ABDOMINAL HYSTERECTOMY     BREAST CYST ASPIRATION Left    neg   CATARACT EXTRACTION W/PHACO Right 04/06/2017   Procedure: CATARACT EXTRACTION PHACO AND INTRAOCULAR LENS PLACEMENT (IOC) RIGHT DIABETIC;  Surgeon: Myrna Adine Anes, MD;  Location: [facility];  Service: Ophthalmology;  Laterality: Right;   CATARACT EXTRACTION W/PHACO Left 05/04/2017   Procedure: CATARACT EXTRACTION  PHACO AND INTRAOCULAR LENS PLACEMENT (IOC) LEFT DIABETIC;  Surgeon: Myrna Adine Anes, MD;  Location: [facility];  Service: Ophthalmology;  Laterality: Left;  DIABETIC   EYE SURGERY     FRACTURE SURGERY     JOINT REPLACEMENT     bilateral knee, 2011    OB History   No obstetric history on file.      Home Medications    Prior to Admission medications   Medication Sig Start Date End Date Taking? Authorizing Provider  aspirin  81 MG tablet Take 1 tablet (81 mg total) by mouth daily. 02/08/12   Marylynn Verneita CROME, MD  Cholecalciferol 125  MCG (5000 UT) capsule Take 5,000 Units by mouth daily.    [provider]  Continuous Glucose Sensor (FREESTYLE LIBRE 3 SENSOR) MISC Use to check blood sugars continuously 05/27/22   Marylynn Verneita CROME, MD  Cyanocobalamin  (B-12) 5000 MCG CAPS Take 1 capsule by mouth daily.    [provider]  fexofenadine (ALLEGRA) 180 MG tablet Take 180 mg by mouth daily.    [provider]  Iron , Ferrous Sulfate , 325 (65 Fe) MG TABS Take 325 mg by mouth daily. 08/24/23   Marylynn Verneita CROME, MD  levothyroxine  (SYNTHROID ) 88 MCG tablet TAKE 1 TABLET DAILY FOR HYPOTHYROIDISM 10/05/23   Marylynn Verneita CROME, MD  magnesium gluconate (MAGONATE) 500 MG tablet Take 500 mg by mouth 2 (two) times daily.    [provider]  PARoxetine  (PAXIL ) 20 MG tablet TAKE 1 TABLET DAILY 01/25/23   Marylynn Verneita CROME, MD  PARoxetine  (PAXIL ) 20 MG tablet Take 20 mg by mouth daily. 09/29/23   [provider]  phenazopyridine (PYRIDIUM) 200 MG tablet Take 1 tablet (200 mg total) by mouth 3 (three) times daily. 11/04/23   Bernardino Ditch, NP  SYNTHROID  88 MCG tablet Take 88 mcg by mouth daily. 10/05/23   [provider]  TURMERIC PO Take by mouth daily.    [provider]    Family History Family History  Problem Relation Age of Onset   Cancer Mother        breast cancer and colon ca   Breast cancer Mother 33   Colon cancer Mother    Cancer Father        leukemia   Diabetes Sister    Hypertension Sister    Breast cancer Sister 77   Arthritis Sister    Cancer Sister    Hyperlipidemia Sister    Obesity Sister    Breast cancer Maternal Aunt    Breast cancer Cousin        maternal 1st cousin    Social History Social History   Tobacco Use   Smoking status: Never   Smokeless tobacco: Never  Vaping Use   Vaping status: Never Used  Substance Use Topics   Alcohol use: Never   Drug use: Never     Allergies   Crestor  [rosuvastatin ]   Review of Systems Review of Systems   Constitutional:  Negative for fever.  Skin:  Positive for color change and wound.  All other systems reviewed and are negative.    Physical Exam Triage Vital Signs ED Triage Vitals  Encounter Vitals Group     BP 12/26/23 1327 137/85     Girls Systolic BP Percentile --      Girls Diastolic BP Percentile --      Boys Systolic BP Percentile --      Boys Diastolic BP Percentile --  Pulse Rate 12/26/23 1327 85     Resp 12/26/23 1327 16     Temp 12/26/23 1327 97.8 F (36.6 C)     Temp Source 12/26/23 1327 Oral     SpO2 12/26/23 1327 94 %     Weight 12/26/23 1326 230 lb 13.2 oz (104.7 kg)     Height 12/26/23 1326 5' 4 (1.626 m)     Head Circumference --      Peak Flow --      Pain Score 12/26/23 1326 0     Pain Loc --      Pain Education --      Exclude from Growth Chart --    No data found.  Updated Vital Signs BP 137/85 (BP Location: Right Arm)   Pulse 85   Temp 97.8 F (36.6 C) (Oral)   Resp 16   Ht 5' 4 (1.626 m)   Wt 230 lb 13.2 oz (104.7 kg)   SpO2 94%   BMI 39.62 kg/m   Visual Acuity Right Eye Distance:   Left Eye Distance:   Bilateral Distance:    Right Eye Near:   Left Eye Near:    Bilateral Near:     Physical Exam Vitals and nursing note reviewed.  Skin:    Findings: Laceration and wound present.     Comments: Left hand dorsal aspect, well-healed laceration noted, no purulent drainage.  Neurological:     General: No focal deficit present.     Mental Status: She is alert and oriented to person, place, and time.     GCS: GCS eye subscore is 4. GCS verbal subscore is 5. GCS motor subscore is 6.  Psychiatric:        Attention and Perception: Attention normal.        Mood and Affect: Mood normal.        Speech: Speech normal.        Behavior: Behavior normal. Behavior is cooperative.      UC Treatments / Results  Labs (all labs ordered are listed, but only abnormal results are displayed) Labs Reviewed - No data to  display  EKG   Radiology No results found.  Procedures Procedures (including critical care time)  Medications Ordered in UC Medications - No data to display  Initial Impression / Assessment and Plan / UC Course  I have reviewed the triage vital signs and the nursing notes.  Pertinent labs & imaging results that were available during my care of the patient were reviewed by me and considered in my medical decision making (see chart for details).  Clinical Course as of 12/26/23 1406  Sun Dec 26, 2023  1335 10 sutures removed from left hand by RN, pt tolerated well, area healing well, no redness or drainage noted.  [JD]    Clinical Course User Index [JD] Kailoni Vahle, Rilla, NP   Discussed exam findings plan of care with patient, sutures removed,  dressing applied, discussed wound care with patient.  Patient verbalized understanding to this provider.  Ddx: Wound check, suture removal Final Clinical Impressions(s) / UC Diagnoses   Final diagnoses:  Visit for suture removal  Visit for wound check     Discharge Instructions      Keep wound clean and dry.    ED Prescriptions   None    PDMP not reviewed this encounter.   Aminta Rilla, NP 12/26/23 1406

## 2023-12-26 NOTE — ED Triage Notes (Signed)
 Patient here to see if her sutures can be removed from her left hand today.  Patient denies any problems with her left hand

## 2023-12-26 NOTE — Discharge Instructions (Signed)
 Keep wound clean and dry.

## 2024-01-03 ENCOUNTER — Ambulatory Visit: Admitting: Internal Medicine

## 2024-01-03 ENCOUNTER — Other Ambulatory Visit

## 2024-01-11 ENCOUNTER — Inpatient Hospital Stay: Admitting: Internal Medicine

## 2024-01-11 ENCOUNTER — Telehealth: Payer: Self-pay | Admitting: Internal Medicine

## 2024-01-11 ENCOUNTER — Encounter: Payer: Self-pay | Admitting: Internal Medicine

## 2024-01-11 ENCOUNTER — Inpatient Hospital Stay

## 2024-01-11 NOTE — Telephone Encounter (Signed)
 Pt called to r/s appt due to weather - r/s appt w/pt and confirmed date/time - LH

## 2024-01-26 ENCOUNTER — Inpatient Hospital Stay

## 2024-01-26 ENCOUNTER — Inpatient Hospital Stay: Admitting: Internal Medicine

## 2024-01-26 ENCOUNTER — Telehealth: Payer: Self-pay

## 2024-01-26 ENCOUNTER — Telehealth: Payer: Self-pay | Admitting: Internal Medicine

## 2024-01-26 NOTE — Telephone Encounter (Signed)
 Pt called to r/s appt from today (12/17) she needed a different day. Appts r/s to 12/23. Pt also wants to know if she can be scheduled with an NP after the lab instead of the MD. I told her I would ask the team and they would let her know an update

## 2024-01-26 NOTE — Telephone Encounter (Signed)
 Copied from CRM #[id number]. Topic: Clinical - Medication Question >> Jan 25, 2024  5:54 PM Alfonso ORN wrote: Reason for CRM: [facility] called on behalf of pt to f/u on request from 12/8 for cpap advised not on chart . Please advise  [phone]

## 2024-01-28 NOTE — Telephone Encounter (Signed)
Form has been printed, signed and faxed.

## 2024-02-01 ENCOUNTER — Encounter: Payer: Self-pay | Admitting: Internal Medicine

## 2024-02-01 ENCOUNTER — Inpatient Hospital Stay: Attending: Internal Medicine

## 2024-02-01 ENCOUNTER — Inpatient Hospital Stay: Admitting: Internal Medicine

## 2024-02-01 VITALS — BP 125/76 | HR 80 | Temp 97.7°F | Resp 16 | Ht 64.0 in | Wt 230.0 lb

## 2024-02-01 DIAGNOSIS — E538 Deficiency of other specified B group vitamins: Secondary | ICD-10-CM | POA: Insufficient documentation

## 2024-02-01 DIAGNOSIS — Z8261 Family history of arthritis: Secondary | ICD-10-CM | POA: Diagnosis not present

## 2024-02-01 DIAGNOSIS — Z9842 Cataract extraction status, left eye: Secondary | ICD-10-CM | POA: Insufficient documentation

## 2024-02-01 DIAGNOSIS — Z8249 Family history of ischemic heart disease and other diseases of the circulatory system: Secondary | ICD-10-CM | POA: Diagnosis not present

## 2024-02-01 DIAGNOSIS — Z9660 Presence of unspecified orthopedic joint implant: Secondary | ICD-10-CM | POA: Insufficient documentation

## 2024-02-01 DIAGNOSIS — Z7982 Long term (current) use of aspirin: Secondary | ICD-10-CM | POA: Insufficient documentation

## 2024-02-01 DIAGNOSIS — Z79890 Hormone replacement therapy (postmenopausal): Secondary | ICD-10-CM | POA: Insufficient documentation

## 2024-02-01 DIAGNOSIS — Z90710 Acquired absence of both cervix and uterus: Secondary | ICD-10-CM | POA: Diagnosis not present

## 2024-02-01 DIAGNOSIS — Z9841 Cataract extraction status, right eye: Secondary | ICD-10-CM | POA: Insufficient documentation

## 2024-02-01 DIAGNOSIS — E785 Hyperlipidemia, unspecified: Secondary | ICD-10-CM | POA: Insufficient documentation

## 2024-02-01 DIAGNOSIS — Z800 Family history of malignant neoplasm of digestive organs: Secondary | ICD-10-CM | POA: Insufficient documentation

## 2024-02-01 DIAGNOSIS — Z604 Social exclusion and rejection: Secondary | ICD-10-CM | POA: Insufficient documentation

## 2024-02-01 DIAGNOSIS — Z87891 Personal history of nicotine dependence: Secondary | ICD-10-CM | POA: Diagnosis not present

## 2024-02-01 DIAGNOSIS — Z806 Family history of leukemia: Secondary | ICD-10-CM | POA: Diagnosis not present

## 2024-02-01 DIAGNOSIS — E119 Type 2 diabetes mellitus without complications: Secondary | ICD-10-CM | POA: Insufficient documentation

## 2024-02-01 DIAGNOSIS — Z8349 Family history of other endocrine, nutritional and metabolic diseases: Secondary | ICD-10-CM | POA: Insufficient documentation

## 2024-02-01 DIAGNOSIS — G4733 Obstructive sleep apnea (adult) (pediatric): Secondary | ICD-10-CM | POA: Diagnosis not present

## 2024-02-01 DIAGNOSIS — K5730 Diverticulosis of large intestine without perforation or abscess without bleeding: Secondary | ICD-10-CM | POA: Insufficient documentation

## 2024-02-01 DIAGNOSIS — Z860100 Personal history of colon polyps, unspecified: Secondary | ICD-10-CM | POA: Diagnosis not present

## 2024-02-01 DIAGNOSIS — Z803 Family history of malignant neoplasm of breast: Secondary | ICD-10-CM | POA: Insufficient documentation

## 2024-02-01 DIAGNOSIS — Z83438 Family history of other disorder of lipoprotein metabolism and other lipidemia: Secondary | ICD-10-CM | POA: Insufficient documentation

## 2024-02-01 DIAGNOSIS — D539 Nutritional anemia, unspecified: Secondary | ICD-10-CM

## 2024-02-01 DIAGNOSIS — Z833 Family history of diabetes mellitus: Secondary | ICD-10-CM | POA: Diagnosis not present

## 2024-02-01 DIAGNOSIS — Z79899 Other long term (current) drug therapy: Secondary | ICD-10-CM | POA: Insufficient documentation

## 2024-02-01 DIAGNOSIS — I10 Essential (primary) hypertension: Secondary | ICD-10-CM | POA: Diagnosis not present

## 2024-02-01 DIAGNOSIS — M810 Age-related osteoporosis without current pathological fracture: Secondary | ICD-10-CM | POA: Diagnosis not present

## 2024-02-01 LAB — CBC WITH DIFFERENTIAL (CANCER CENTER ONLY)
Abs Immature Granulocytes: 0.05 K/uL (ref 0.00–0.07)
Basophils Absolute: 0 K/uL (ref 0.0–0.1)
Basophils Relative: 1 %
Eosinophils Absolute: 0.3 K/uL (ref 0.0–0.5)
Eosinophils Relative: 7 %
HCT: 36.2 % (ref 36.0–46.0)
Hemoglobin: 12.1 g/dL (ref 12.0–15.0)
Immature Granulocytes: 1 %
Lymphocytes Relative: 36 %
Lymphs Abs: 1.3 K/uL (ref 0.7–4.0)
MCH: 36.8 pg — ABNORMAL HIGH (ref 26.0–34.0)
MCHC: 33.4 g/dL (ref 30.0–36.0)
MCV: 110 fL — ABNORMAL HIGH (ref 80.0–100.0)
Monocytes Absolute: 0.3 K/uL (ref 0.1–1.0)
Monocytes Relative: 8 %
Neutro Abs: 1.7 K/uL (ref 1.7–7.7)
Neutrophils Relative %: 47 %
Platelet Count: 276 K/uL (ref 150–400)
RBC: 3.29 MIL/uL — ABNORMAL LOW (ref 3.87–5.11)
RDW: 16.3 % — ABNORMAL HIGH (ref 11.5–15.5)
WBC Count: 3.6 K/uL — ABNORMAL LOW (ref 4.0–10.5)
nRBC: 0 % (ref 0.0–0.2)

## 2024-02-01 LAB — VITAMIN B12: Vitamin B-12: 1472 pg/mL — ABNORMAL HIGH (ref 180–914)

## 2024-02-01 LAB — RETICULOCYTES
Immature Retic Fract: 25.6 % — ABNORMAL HIGH (ref 2.3–15.9)
RBC.: 3.27 MIL/uL — ABNORMAL LOW (ref 3.87–5.11)
Retic Count, Absolute: 71.6 K/uL (ref 19.0–186.0)
Retic Ct Pct: 2.2 % (ref 0.4–3.1)

## 2024-02-01 LAB — CMP (CANCER CENTER ONLY)
ALT: 15 U/L (ref 0–44)
AST: 15 U/L (ref 15–41)
Albumin: 4.4 g/dL (ref 3.5–5.0)
Alkaline Phosphatase: 63 U/L (ref 38–126)
Anion gap: 12 (ref 5–15)
BUN: 22 mg/dL (ref 8–23)
CO2: 25 mmol/L (ref 22–32)
Calcium: 10 mg/dL (ref 8.9–10.3)
Chloride: 103 mmol/L (ref 98–111)
Creatinine: 0.81 mg/dL (ref 0.44–1.00)
GFR, Estimated: >60 mL/min
Glucose, Bld: 96 mg/dL (ref 70–99)
Potassium: 4.2 mmol/L (ref 3.5–5.1)
Sodium: 140 mmol/L (ref 135–145)
Total Bilirubin: 0.3 mg/dL (ref 0.0–1.2)
Total Protein: 7.1 g/dL (ref 6.5–8.1)

## 2024-02-01 LAB — IRON AND TIBC
Iron: 125 ug/dL (ref 28–170)
Saturation Ratios: 38 % — ABNORMAL HIGH (ref 10.4–31.8)
TIBC: 330 ug/dL (ref 250–450)
UIBC: 205 ug/dL

## 2024-02-01 LAB — LACTATE DEHYDROGENASE: LDH: 150 U/L (ref 105–235)

## 2024-02-01 LAB — FERRITIN: Ferritin: 64 ng/mL (ref 11–307)

## 2024-02-01 NOTE — Progress Notes (Signed)
 [facility] CONSULT NOTE  Patient Care Team: Marylynn Verneita CROME, MD as PCP - General (Internal Medicine) Myrna Adine Anes, MD as Consulting Physician (Ophthalmology) Rennie Cindy SAUNDERS, MD as Consulting Physician (Oncology) Marylynn Verneita CROME, MD (Internal Medicine)  CHIEF COMPLAINTS/PURPOSE OF CONSULTATION: ANEMIA   HEMATOLOGY HISTORY  # ANEMIA[Hb; MCV-platelets- WBC; Iron  sat; ferritin;  GFR- CT/US ; EGD/colonoscopy-  HISTORY OF PRESENTING ILLNESS: Patient ambulating-independently  Alone    Brenda Floyd 83 y.o.  female pleasant patient macrocytic anemia-of unclear etiology-possible low-grade MDS [no bone marrow biopsy] is here for follow-up.  Discussed the use of AI scribe software for clinical note transcription with the patient, who gave verbal consent to proceed.  History of Present Illness   Brenda Floyd is an 82 year old female with macrocytic anemia under surveillance for possible low grade myelodysplastic syndrome who presents for hematology/oncology follow-up.  She has macrocytic anemia with concern for low grade myelodysplastic syndrome, previously with hemoglobin as low as 8 g/dL last year. Her hemoglobin improved to 11.2 g/dL at the last visit and is currently 12 g/dL.  She reports ongoing painless weight loss, which she describes as positive and not associated with discomfort.  She recently transitioned from ferrous sulfate  to lactoferrin for iron  supplementation, noting improved gastrointestinal tolerance with lactoferrin.      Review of Systems  Constitutional:  Negative for chills, diaphoresis, fever, malaise/fatigue and weight loss.  HENT:  Negative for nosebleeds and sore throat.   Eyes:  Negative for double vision.  Respiratory:  Negative for cough, hemoptysis, sputum production, shortness of breath and wheezing.   Cardiovascular:  Negative for chest pain, palpitations, orthopnea and leg swelling.  Gastrointestinal:  Negative for  abdominal pain, blood in stool, constipation, diarrhea, heartburn, melena, nausea and vomiting.  Genitourinary:  Negative for dysuria, frequency and urgency.  Musculoskeletal:  Negative for back pain and joint pain.  Skin: Negative.  Negative for itching and rash.  Neurological:  Negative for dizziness, tingling, focal weakness, weakness and headaches.  Endo/Heme/Allergies:  Does not bruise/bleed easily.  Psychiatric/Behavioral:  Negative for depression. The patient is not nervous/anxious and does not have insomnia.      MEDICAL HISTORY:  Past Medical History:  Diagnosis Date   Allergy    Anemia    Arthritis    neck, knees, left shoulder   Blunt head trauma 02/24/2023   Head CT report from Dec 23  [facility] [place]     The gray-white matter differentiation is intact. There is no evidence of acute infarct, hemorrhage, mass or mass effect. There is a prominence of the ventricles and sulci consistent with diffuse cortical volume loss. There are periventricular low-attenuation white matter changes, likely small vessel ischemic disease. The visualized paranasal sinu   Cancer (HCC)    skin ca   Cataract    Colon polyps 2012   [facility]er, followup due 2015   Diabetes mellitus    Diverticulosis of sigmoid colon 08/2010   by colonoscopy   Dyspnea    Fracture closed, femur, shaft (HCC) remote   s/p screws and rod repair   Hyperlipidemia    Hypertension    Osteoporosis    Sleep apnea    Thyroid  disease     SURGICAL HISTORY: Past Surgical History:  Procedure Laterality Date   ABDOMINAL HYSTERECTOMY     BREAST CYST ASPIRATION Left    neg   CATARACT EXTRACTION W/PHACO Right 04/06/2017   Procedure: CATARACT EXTRACTION PHACO AND INTRAOCULAR LENS PLACEMENT (IOC) RIGHT DIABETIC;  Surgeon: Myrna Adine Anes, MD;  Location: [facility];  Service: Ophthalmology;  Laterality: Right;   CATARACT EXTRACTION W/PHACO Left 05/04/2017   Procedure: CATARACT EXTRACTION PHACO AND INTRAOCULAR  LENS PLACEMENT (IOC) LEFT DIABETIC;  Surgeon: Myrna Adine Anes, MD;  Location: [facility];  Service: Ophthalmology;  Laterality: Left;  DIABETIC   EYE SURGERY     FRACTURE SURGERY     JOINT REPLACEMENT     bilateral knee, 2011    SOCIAL HISTORY: Social History   Socioeconomic History   Marital status: Single    Spouse name: Not on file   Number of children: 0   Years of education: Not on file   Highest education level: Master's degree (e.g., MA, MS, MEng, MEd, MSW, MBA)  Occupational History   Occupation: retired  Tobacco Use   Smoking status: Never   Smokeless tobacco: Never  Vaping Use   Vaping status: Never Used  Substance and Sexual Activity   Alcohol use: Never   Drug use: Never   Sexual activity: Never  Other Topics Concern   Not on file  Social History Narrative   ** Merged History Encounter **       Never married, no children   Social Drivers of Health   Tobacco Use: Low Risk (02/01/2024)   Patient History    Smoking Tobacco Use: Never    Smokeless Tobacco Use: Never    Passive Exposure: Not on file  Recent Concern: Tobacco Use - Medium Risk (12/13/2023)   Received from [facility]   Patient History    Smoking Tobacco Use: Former    Smokeless Tobacco Use: Never    Passive Exposure: Not on file  Financial Resource Strain: Low Risk (08/21/2023)   Overall Financial Resource Strain (CARDIA)    Difficulty of Paying Living Expenses: Not hard at all  Food Insecurity: No Food Insecurity (08/21/2023)   Epic    Worried About Radiation Protection Practitioner of Food in the Last Year: Never true    Ran Out of Food in the Last Year: Never true  Transportation Needs: No Transportation Needs (08/21/2023)   Epic    Lack of Transportation (Medical): No    Lack of Transportation (Non-Medical): No  Physical Activity: Inactive (08/21/2023)   Exercise Vital Sign    Days of Exercise per Week: 0 days    Minutes of Exercise per Session: Not on file  Stress: No Stress Concern  Present (08/21/2023)   [employer] of Occupational Health - Occupational Stress Questionnaire    Feeling of Stress: Not at all  Social Connections: Socially Isolated (08/21/2023)   Social Connection and Isolation Panel    Frequency of Communication with Friends and Family: Never    Frequency of Social Gatherings with Friends and Family: Never    Attends Religious Services: Never    Database Administrator or Organizations: No    Attends Engineer, Structural: Not on file    Marital Status: Never married  Intimate Partner Violence: Not At Risk (06/01/2023)   Humiliation, Afraid, Rape, and Kick questionnaire    Fear of Current or Ex-Partner: No    Emotionally Abused: No    Physically Abused: No    Sexually Abused: No  Depression (PHQ2-9): Low Risk (02/01/2024)   Depression (PHQ2-9)    PHQ-2 Score: 0  Alcohol Screen: Low Risk (12/01/2022)   Alcohol Screen    Last Alcohol Screening Score (AUDIT): 0  Housing: Unknown (08/21/2023)   Epic    Unable  to Pay for Housing in the Last Year: No    Number of Times Moved in the Last Year: Not on file    Homeless in the Last Year: No  Utilities: Low Risk (08/04/2023)   Received from [facility]   Utilities    Within the past 12 months, have you been unable to get utilities(heat, electricity) when it was really needed?: No  Health Literacy: Adequate Health Literacy (12/02/2022)   B1300 Health Literacy    Frequency of need for help with medical instructions: Never    FAMILY HISTORY: Family History  Problem Relation Age of Onset   Cancer Mother        breast cancer and colon ca   Breast cancer Mother 71   Colon cancer Mother    Cancer Father        leukemia   Diabetes Sister    Hypertension Sister    Breast cancer Sister 61   Arthritis Sister    Cancer Sister    Hyperlipidemia Sister    Obesity Sister    Breast cancer Maternal Aunt    Breast cancer Cousin        maternal 1st cousin    ALLERGIES:  is allergic to  crestor  [rosuvastatin ].  MEDICATIONS:  Current Outpatient Medications  Medication Sig Dispense Refill   aspirin  81 MG tablet Take 1 tablet (81 mg total) by mouth daily. 30 tablet 11   Cholecalciferol 125 MCG (5000 UT) capsule Take 5,000 Units by mouth daily.     Continuous Glucose Sensor (FREESTYLE LIBRE 3 SENSOR) MISC Use to check blood sugars continuously 6 each 3   Cyanocobalamin  (B-12) 5000 MCG CAPS Take 1 capsule by mouth daily.     fexofenadine (ALLEGRA) 180 MG tablet Take 180 mg by mouth daily.     Iron , Ferrous Sulfate , 325 (65 Fe) MG TABS Take 325 mg by mouth daily. 90 tablet 1   LACTOFERRIN PO Take 50 mg by mouth daily.     magnesium gluconate (MAGONATE) 500 MG tablet Take 500 mg by mouth 2 (two) times daily.     PARoxetine  (PAXIL ) 20 MG tablet Take 20 mg by mouth daily.     SYNTHROID  88 MCG tablet Take 88 mcg by mouth daily.     TURMERIC PO Take by mouth daily.     No current facility-administered medications for this visit.     SABRA  PHYSICAL EXAMINATION:   Vitals:   02/01/24 1455 02/01/24 1521  BP: (!) 155/83 125/76  Pulse: 80   Resp: 16   Temp: 97.7 F (36.5 C)   SpO2: 95%    Filed Weights   02/01/24 1455  Weight: 230 lb (104.3 kg)    Physical Exam Vitals and nursing note reviewed.  HENT:     Head: Normocephalic and atraumatic.     Mouth/Throat:     Pharynx: Oropharynx is clear.  Eyes:     Extraocular Movements: Extraocular movements intact.     Pupils: Pupils are equal, round, and reactive to light.  Cardiovascular:     Rate and Rhythm: Normal rate and regular rhythm.  Pulmonary:     Comments: Decreased breath sounds bilaterally.  Abdominal:     Palpations: Abdomen is soft.  Musculoskeletal:        General: Normal range of motion.     Cervical back: Normal range of motion.  Skin:    General: Skin is warm.  Neurological:     General: No focal deficit present.  Mental Status: She is alert and oriented to person, place, and time.  Psychiatric:         Behavior: Behavior normal.        Judgment: Judgment normal.      LABORATORY DATA:  I have reviewed the data as listed Lab Results  Component Value Date   WBC 3.6 (L) 02/01/2024   HGB 12.1 02/01/2024   HCT 36.2 02/01/2024   MCV 110.0 (H) 02/01/2024   PLT 276 02/01/2024   Recent Labs    08/24/23 1424 09/01/23 1510 02/01/24 1500  NA 139 135 140  K 4.7 3.8 4.2  CL 103 104 103  CO2 26 23 25   GLUCOSE 97 98 96  BUN 20 19 22   CREATININE 0.80 0.82 0.81  CALCIUM  9.6 9.1 10.0  GFRNONAA  --  >60 >60  PROT 6.8 6.5 7.1  ALBUMIN 4.2 3.9 4.4  AST 12 13* 15  ALT 13 16 15   ALKPHOS 58 54 63  BILITOT 0.4 0.5 0.3     No results found.  ASSESSMENT & PLAN:   Macrocytic anemia # Macrocytic anemia-  : Hemoglobin: 10 [since 2023] MCV -102- normal white count/platelets.  Fairly asymptomatic. # Patient currently on B12 and folic acid .  No obvious evidence of hemolysis patient does not drink any alcohol.  No history of any liver disease.  # today Hb 12.1- Discussed with the patient that MDS is high in the differential-however will require a bone marrow biopsy to diagnose.  Since clinical stable I think is reasonable to hold off of bone marrow biopsy.  # B12-deficiency- on SL B12 daily.   # DM-patient currently on keto/low-carb diet.  She has been able to come OFF insulin -congratulated patient on losing weight.  # OSA on CPAP- stable.   # DISPOSITION: # follow up in 4 months-   CBC/ CMP; LDH; retic count; iron  studies; ferritin; b12 -  Dr.B     Cindy JONELLE Joe, MD 02/02/2024 11:27 AM

## 2024-02-01 NOTE — Assessment & Plan Note (Addendum)
#   Macrocytic anemia-  : Hemoglobin: 10 [since 2023] MCV -102- normal white count/platelets.  Fairly asymptomatic. # Patient currently on B12 and folic acid .  No obvious evidence of hemolysis patient does not drink any alcohol.  No history of any liver disease.  # today Hb 12.1- Discussed with the patient that MDS is high in the differential-however will require a bone marrow biopsy to diagnose.  Since clinical stable I think is reasonable to hold off of bone marrow biopsy.  # B12-deficiency- on SL B12 daily.   # DM-patient currently on keto/low-carb diet.  She has been able to come OFF insulin -congratulated patient on losing weight.  # OSA on CPAP- stable.   # DISPOSITION: # follow up in 4 months-   CBC/ CMP; LDH; retic count; iron  studies; ferritin; b12 -  Dr.B

## 2024-02-01 NOTE — Progress Notes (Signed)
 No concerns today

## 2024-02-08 ENCOUNTER — Telehealth: Payer: Self-pay

## 2024-02-08 NOTE — Telephone Encounter (Signed)
Fax not received as of yet.

## 2024-02-08 NOTE — Telephone Encounter (Signed)
 Copied from CRM #[id number]. Topic: General - Other >> Feb 08, 2024 11:31 AM Rea ORN wrote: Reason for CRM: [facility] will be sending over a fax for continuous glucose monitoring supplies. They wanted the clinic to be aware that it was on the way. Lauraine said they sent on in yesterday also but I couldn't advise if it was received or not.

## 2024-02-09 NOTE — Telephone Encounter (Signed)
 Attempted to call pt. No answer. No voicemail. Need to find out if pt is using this company to get her herlene supplies.

## 2024-02-14 NOTE — Telephone Encounter (Signed)
 Attempted to call pt. No answer. No voicemail. Need to find out if pt is using this company to get her herlene supplies.

## 2024-02-14 NOTE — Telephone Encounter (Unsigned)
 Copied from CRM [id number]. Topic: General - Other >> Feb 14, 2024  1:04 PM Roselie BROCKS wrote: Reason for CRM: Camie from [facility] calling to confirm if clinic has received a fax for continuous glucose monitoring supplies. And requests a return call to confirm Call [phone]  Fax [phone]

## 2024-02-21 NOTE — Telephone Encounter (Signed)
Attempted to call pt- No answer/No voicemail.  

## 2024-02-21 NOTE — Telephone Encounter (Unsigned)
 Copied from CRM [id number]. Topic: General - Other >> Feb 21, 2024  2:23 PM Deleta RAMAN wrote: Reason for CRM: sarah from [facility] [employer] calling to see if fax was received for freestyle libre standing order and urn around time

## 2024-02-25 ENCOUNTER — Ambulatory Visit: Admitting: Internal Medicine

## 2024-03-06 NOTE — Telephone Encounter (Signed)
Attempted to call pt- No answer/No voicemail.  

## 2024-03-07 NOTE — Telephone Encounter (Signed)
Mailed an unable to reach letter.  

## 2024-03-13 ENCOUNTER — Ambulatory Visit: Admitting: Internal Medicine

## 2024-03-17 ENCOUNTER — Telehealth: Payer: Self-pay

## 2024-03-17 NOTE — Telephone Encounter (Signed)
 Copied from CRM [id number]. Topic: General - Call Back - No Documentation >> Mar 17, 2024 11:42 AM Rea BROCKS wrote: Reason for CRM: Dena from [facility] are waiting on patients medicare required [employer] office notes. They were told patient had an appointment on Feb 2nd and are following up for those appoinment notes. They sent a fax request to clinic.   [phone] (callback)

## 2024-03-21 ENCOUNTER — Ambulatory Visit

## 2024-04-07 ENCOUNTER — Ambulatory Visit: Admitting: Internal Medicine

## 2024-05-24 ENCOUNTER — Ambulatory Visit

## 2024-06-02 ENCOUNTER — Inpatient Hospital Stay

## 2024-06-02 ENCOUNTER — Inpatient Hospital Stay: Admitting: Internal Medicine
